# Patient Record
Sex: Female | Born: 1945
Health system: Southern US, Community
[De-identification: ages and names within clinical notes are randomized; demographics above are authoritative.]

## PROBLEM LIST (undated history)

## (undated) DIAGNOSIS — M797 Fibromyalgia: Secondary | ICD-10-CM

## (undated) DIAGNOSIS — G990 Autonomic neuropathy in diseases classified elsewhere: Secondary | ICD-10-CM

## (undated) DIAGNOSIS — F5104 Psychophysiologic insomnia: Secondary | ICD-10-CM

## (undated) DIAGNOSIS — K859 Acute pancreatitis without necrosis or infection, unspecified: Secondary | ICD-10-CM

## (undated) DIAGNOSIS — L511 Stevens-Johnson syndrome: Secondary | ICD-10-CM

## (undated) DIAGNOSIS — C4491 Basal cell carcinoma of skin, unspecified: Secondary | ICD-10-CM

## (undated) DIAGNOSIS — H35039 Hypertensive retinopathy, unspecified eye: Secondary | ICD-10-CM

## (undated) DIAGNOSIS — I951 Orthostatic hypotension: Secondary | ICD-10-CM

## (undated) DIAGNOSIS — M359 Systemic involvement of connective tissue, unspecified: Secondary | ICD-10-CM

## (undated) DIAGNOSIS — J850 Gangrene and necrosis of lung: Secondary | ICD-10-CM

## (undated) DIAGNOSIS — E785 Hyperlipidemia, unspecified: Secondary | ICD-10-CM

## (undated) DIAGNOSIS — E039 Hypothyroidism, unspecified: Secondary | ICD-10-CM

## (undated) DIAGNOSIS — K219 Gastro-esophageal reflux disease without esophagitis: Secondary | ICD-10-CM

## (undated) DIAGNOSIS — N393 Stress incontinence (female) (male): Secondary | ICD-10-CM

## (undated) DIAGNOSIS — G43909 Migraine, unspecified, not intractable, without status migrainosus: Secondary | ICD-10-CM

## (undated) HISTORY — PX: CATARACT EXTRACTION: SUR2

## (undated) HISTORY — PX: FRACTURE SURGERY: SHX138

## (undated) HISTORY — PX: YAG LASER APPLICATION: SHX6189

## (undated) HISTORY — DX: Stevens-Johnson syndrome: L51.1

## (undated) HISTORY — PX: EYE SURGERY: SHX253

## (undated) HISTORY — DX: Hypertensive retinopathy, unspecified eye: H35.039

## (undated) HISTORY — DX: Psychophysiologic insomnia: F51.04

## (undated) HISTORY — DX: Orthostatic hypotension: I95.1

## (undated) HISTORY — DX: Stress incontinence (female) (male): N39.3

---
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
## 1977-03-21 HISTORY — PX: APPENDECTOMY: SHX54

## 1977-03-21 HISTORY — PX: ABDOMINAL HYSTERECTOMY: SHX81

## 1986-03-21 HISTORY — PX: KNEE ARTHROSCOPY: SHX127

## 2002-03-21 HISTORY — PX: LAPAROSCOPIC CHOLECYSTECTOMY: SUR755

## 2008-03-21 DIAGNOSIS — J850 Gangrene and necrosis of lung: Secondary | ICD-10-CM

## 2008-03-21 HISTORY — PX: LUNG REMOVAL, PARTIAL: SHX233

## 2008-03-21 HISTORY — DX: Gangrene and necrosis of lung: J85.0

## 2008-03-21 HISTORY — PX: ELBOW FRACTURE SURGERY: SHX616

## 2012-03-21 HISTORY — PX: CATARACT EXTRACTION W/ INTRAOCULAR LENS  IMPLANT, BILATERAL: SHX1307

## 2014-12-20 HISTORY — PX: LOOP RECORDER IMPLANT: SHX5954

## 2015-03-22 DIAGNOSIS — G908 Other disorders of autonomic nervous system: Secondary | ICD-10-CM

## 2015-03-22 DIAGNOSIS — G9089 Other disorders of autonomic nervous system: Secondary | ICD-10-CM

## 2015-03-22 HISTORY — DX: Other disorders of autonomic nervous system: G90.8

## 2015-03-22 HISTORY — DX: Other disorders of autonomic nervous system: G90.89

## 2015-03-22 HISTORY — PX: ANKLE FRACTURE SURGERY: SHX122

## 2016-12-23 DIAGNOSIS — H538 Other visual disturbances: Secondary | ICD-10-CM | POA: Diagnosis not present

## 2016-12-23 DIAGNOSIS — J310 Chronic rhinitis: Secondary | ICD-10-CM | POA: Diagnosis not present

## 2016-12-23 DIAGNOSIS — I951 Orthostatic hypotension: Secondary | ICD-10-CM | POA: Diagnosis not present

## 2016-12-23 DIAGNOSIS — F4321 Adjustment disorder with depressed mood: Secondary | ICD-10-CM | POA: Diagnosis not present

## 2016-12-23 DIAGNOSIS — M797 Fibromyalgia: Secondary | ICD-10-CM | POA: Diagnosis not present

## 2016-12-23 DIAGNOSIS — G43909 Migraine, unspecified, not intractable, without status migrainosus: Secondary | ICD-10-CM | POA: Diagnosis not present

## 2016-12-23 DIAGNOSIS — E038 Other specified hypothyroidism: Secondary | ICD-10-CM | POA: Diagnosis not present

## 2016-12-23 DIAGNOSIS — G4709 Other insomnia: Secondary | ICD-10-CM | POA: Diagnosis not present

## 2016-12-23 DIAGNOSIS — E7849 Other hyperlipidemia: Secondary | ICD-10-CM | POA: Diagnosis not present

## 2017-01-16 DIAGNOSIS — M25571 Pain in right ankle and joints of right foot: Secondary | ICD-10-CM | POA: Diagnosis not present

## 2017-01-16 DIAGNOSIS — S93491A Sprain of other ligament of right ankle, initial encounter: Secondary | ICD-10-CM | POA: Diagnosis not present

## 2017-01-26 DIAGNOSIS — H26493 Other secondary cataract, bilateral: Secondary | ICD-10-CM | POA: Diagnosis not present

## 2017-01-26 DIAGNOSIS — Z961 Presence of intraocular lens: Secondary | ICD-10-CM | POA: Diagnosis not present

## 2017-01-26 DIAGNOSIS — H35033 Hypertensive retinopathy, bilateral: Secondary | ICD-10-CM | POA: Diagnosis not present

## 2017-01-26 DIAGNOSIS — H34831 Tributary (branch) retinal vein occlusion, right eye, with macular edema: Secondary | ICD-10-CM | POA: Diagnosis not present

## 2017-01-27 NOTE — Progress Notes (Signed)
[facility] Note  01/31/2017     CHIEF COMPLAINT Patient presents for Retina Evaluation   HISTORY OF PRESENT ILLNESS: Natalie Johnson is a 71 y.o. female who presents to the clinic today for:   HPI    Retina Evaluation    In both eyes.  Associated Symptoms Blind Spot, Floaters and Distortion.  Negative for Flashes, Pain, Trauma, Fever, Weight Loss, Scalp Tenderness, Redness, Shoulder/Hip pain, Fatigue, Jaw Claudication, Photophobia and Glare.  Context:  mid-range vision, near vision and watching TV.  Treatments tried include no treatments.  I, the attending physician,  performed the HPI with the patient and updated documentation appropriately.          Comments    Referral of Dr. Kathlen Mody for Eval. Of retina BRVO W/CME OD . Patient states her right eye started having floaters and blind spots about 3 months ago then she started having cloudy vision soon afterwards.Denies eye vits and eye gtts .       Last edited by [name], MD on 01/31/2017  9:40 AM. (History)    Pt reports that she just moved to [facility] in July, states that she has been treated at a retinal clinic in [place], pt states that she was getting injections, pt is unable to recall the medication that she was being injected with; Pt reports that she wishes to have injection today; Pt denies being diabetic; Pt reports only having cataract surgery;   Referring physician: Hortencia Pilar, MD [place], [place] [address]  HISTORICAL INFORMATION:   Selected notes from the MEDICAL RECORD NUMBER Referral from Dr. Read Drivers for concern of BRVO OD;  LEE- 11.8.18 (Dr. Read Drivers) Ocular Hx- pseudophakia OU (2015, in [place], Surgeon: unknown), Hx of injections for 'retinal bleeding' [last injection around June 2018], SJS, HTN ret OU;  PMH- SJS, thyroid disease, high chol., fibromyalgia;    CURRENT MEDICATIONS: No current outpatient medications on file. (Ophthalmic Drugs)   No  current facility-administered medications for this visit.  (Ophthalmic Drugs)   Current Outpatient Medications (Other)  Medication Sig  . amitriptyline (ELAVIL) 100 MG tablet   . clonazePAM (KLONOPIN) 2 MG tablet   . DULoxetine (CYMBALTA) 30 MG capsule   . fludrocortisone (FLORINEF) 0.1 MG tablet Take 0.1 mg daily by mouth.  . levothyroxine (SYNTHROID, LEVOTHROID) 100 MCG tablet   . lovastatin (MEVACOR) 40 MG tablet   . omeprazole (PRILOSEC) 40 MG capsule   . potassium chloride (MICRO-K) 10 MEQ CR capsule   . rizatriptan (MAXALT) 10 MG tablet    Current Facility-Administered Medications (Other)  Medication Route  . Bevacizumab (AVASTIN) SOLN 1.25 mg Intravitreal      REVIEW OF SYSTEMS: ROS    Positive for: Genitourinary, Musculoskeletal, Cardiovascular, Eyes, Respiratory, Allergic/Imm   Negative for: Constitutional, Gastrointestinal, Skin, HENT, Endocrine, Psychiatric, Heme/Lymph   Last edited by Zenovia Jordan, LPN on 34/74/2595  6:38 AM. (History)       ALLERGIES Allergies  Allergen Reactions  . Augmentin [Amoxicillin-Pot Clavulanate] Anaphylaxis  . Trihexyphenidyl Hcl Anaphylaxis  . Vancomycin Anaphylaxis  . Amoxicillin Hives  . Penicillins Hives  . Piperacillin Hives  . Potassium-Containing Compounds   . Sodium Acetylsalicylate [Aspirin] Hives  . Soma [Carisoprodol] Hives  . Linezolid Rash    PAST MEDICAL HISTORY Past Medical History:  Diagnosis Date  . Ankle fracture   . Neuropathy   . Stevens-Johnson disease [facility])    Past Surgical History:  Procedure Laterality Date  .  ABDOMINAL HYSTERECTOMY    . CATARACT EXTRACTION  2014   OU  . ELBOW FRACTURE SURGERY    . GALLBLADDER SURGERY    . KNEE ARTHROSCOPY    . LUNG LOBECTOMY    . SHOULDER SURGERY    . TONSILLECTOMY      FAMILY HISTORY Family History  Problem Relation Age of Onset  . Macular degeneration Mother   . Glaucoma Father     SOCIAL HISTORY Social History   Tobacco Use  . Smoking  status: Never Smoker  . Smokeless tobacco: Never Used  Substance Use Topics  . Alcohol use: No    Frequency: Never  . Drug use: No         OPHTHALMIC EXAM:  Base Eye Exam    Visual Acuity (Snellen - Linear)      Right Left   Dist [place] 20/100 +2 20/30 -2   Dist ph [place] 20/50 -2 20/25 -2       Tonometry (Tonopen, 9:17 AM)      Right Left   Pressure 14 12       Pupils      Dark Light Shape React APD   Right 6 5 Round 1 None   Left 6 5 Round 1 None       Visual Fields (Counting fingers)      Left Right    Full Full       Extraocular Movement      Right Left    Full, Ortho Full, Ortho       Neuro/Psych    Oriented x3:  Yes   Mood/Affect:  Normal       Dilation    Both eyes:  1.0% Mydriacyl, 2.5% Phenylephrine @ 9:17 AM        Slit Lamp and Fundus Exam    External Exam      Right Left   External Normal Normal       Slit Lamp Exam      Right Left   Lids/Lashes Dermatochalasis - upper lid Dermatochalasis - upper lid   Conjunctiva/Sclera White and quiet White and quiet   Cornea Arcus, 2+ Punctate epithelial erosions Arcus, 2+ Punctate epithelial erosions   Anterior Chamber Deep and quiet Deep and quiet   Iris Round and dilated to 36m; no NVI Round and dilated to 868m  Lens Posterior chamber intraocular lens, Nasal 1+ Posterior capsular opacification Posterior chamber intraocular lens, Nasal 1+ diffuse Posterior capsular opacification   Vitreous Vitreous syneresis, Posterior vitreous detachment Vitreous syneresis       Fundus Exam      Right Left   Disc Normal, vascular loops Normal   C/D Ratio 0.4 0.45   Macula + intraretinal hemorrhages and MAs, + edema Flat, few nasal MAs   Vessels Tortuous Mild tortuosity    Periphery Attached, scattered dot hemorrhages, punctate pigmented lesion at 1200 Attached        Refraction    Manifest Refraction (Retinoscopy)      Sphere Cylinder Axis Dist VA   Right -0.25 +1.00 020 20/40-2   Left -0.50   20/25-2           IMAGING AND PROCEDURES  Imaging and Procedures for 01/31/17  OCT, Retina - OU - Both Eyes     Right Eye Quality was good. Central Foveal Thickness: 509. Progression has no prior data. Findings include abnormal foveal contour, subretinal fluid, intraretinal fluid.   Left Eye Quality was good. Central Foveal Thickness: 262.  Progression has no prior data. Findings include normal foveal contour, no IRF, no SRF, vitreomacular adhesion .   Notes Images taken, stored on drive  Diagnosis / Impression:  OD: BRVO with CME OS: VMA, No IRF, No SRF  Clinical management:  See below  Abbreviations: NFP - Normal foveal profile. CME - cystoid macular edema. PED - pigment epithelial detachment. IRF - intraretinal fluid. SRF - subretinal fluid. EZ - ellipsoid zone. ERM - epiretinal membrane. ORA - outer retinal atrophy. ORT - outer retinal tubulation. SRHM - subretinal hyper-reflective material         Intravitreal Injection, Pharmacologic Agent - OD - Right Eye     Time Out 01/31/2017. 10:39 AM. Confirmed correct patient, procedure, site, and patient consented.   Anesthesia Topical anesthesia was used. Anesthetic medications included Lidocaine 2%, Tetracaine 0.5%.   Procedure Preparation included 5% betadine to ocular surface, eyelid speculum. A 30 gauge needle was used.   Injection: 1.25 mg Bevacizumab 1.53m/0.05ml   NDC: 585885-027-74   Lot: [id number]'@11'     Expiration Date: 03/01/2017   Route: Intravitreal   Site: Right Eye   Waste: 0 mg  Post-op Post injection exam found visual acuity of at least counting fingers. The patient tolerated the procedure well. There were no complications. The patient received written and verbal post procedure care education.                 ASSESSMENT/PLAN:    ICD-10-CM   1. Branch retinal vein occlusion of right eye with macular edema H34.8310 Intravitreal Injection, Pharmacologic Agent - OD - Right Eye    Bevacizumab (AVASTIN)  SOLN 1.25 mg  2. Retinal edema H35.81 OCT, Retina - OU - Both Eyes  3. Vitreomacular adhesion of left eye H43.822   4. Hypertensive retinopathy of both eyes H35.033   5. Pseudophakia of both eyes Z96.1   6. Stevens-Johnson syndrome (HCC) L51.1     1,2. BRVO with ME OD-  - The natural history of retinal vein occlusion and macular edema and treatment options including observation, laser photocoagulation, and intravitreal antiVEGF injection with Avastin and Lucentis and Eylea and intravitreal injection of steroids with triamcinolone and Ozurdex and the complications of these procedures including loss of vision, infection, cataract, glaucoma, and retinal detachment were discussed with patient. - Specifically discussed findings [place] regarding patient stabilization with anti-VEGF agents and increased potential for visual improvements.  Also discussed need for frequent follow up and potentially multiple injections given the chronic nature of the disease process [place] [place] was receiving unknown injections, treat and extend, OD - last injection OD in June 2018 - subjective worsening of vision OD since July - BCVA 20/50 OD - OCT shows +IRF and SRF OD - recommend IVA OD #1 today, 11.13.18 - RBA of procedure discussed, questions answered - informed consent obtained and signed - see procedure note - F/U 4 weeks -- DFE/OCT/possible injection  3. VMA OS - mild without traction - monitor  4. Hypertensive Retinopathy OU-  - discussed importance of tight BP control - monitor  5. Pseudophakia OU-  - s/p CE/IOL OU - beautiful surgery, doing well - monitor  6. SJS w/ mild DES OU - recommend artificial tears and lubricating ointment as needed   Ophthalmic Meds Ordered this visit:  Meds ordered this encounter  Medications  . Bevacizumab (AVASTIN) SOLN 1.25 mg       Return in about 4 weeks (around 02/28/2017) for F/U BRVO  with ME OD.  There are no  Patient Instructions on file for this visit.   Explained the diagnoses, plan, and follow up with the patient and they expressed understanding.  Patient expressed understanding of the importance of proper follow up care.   Gardiner Sleeper, M.D., Ph.D. Diseases & Surgery of the Retina and Vitreous [facility] 01/31/17     Abbreviations: M myopia (nearsighted); A astigmatism; H hyperopia (farsighted); P presbyopia; Mrx spectacle prescription;  CTL contact lenses; OD right eye; OS left eye; OU both eyes  XT exotropia; ET esotropia; PEK punctate epithelial keratitis; PEE punctate epithelial erosions; DES dry eye syndrome; MGD meibomian gland dysfunction; ATs artificial tears; PFAT's preservative free artificial tears; [place] nuclear sclerotic cataract; PSC posterior subcapsular cataract; ERM epi-retinal membrane; PVD posterior vitreous detachment; RD retinal detachment; DM diabetes mellitus; DR diabetic retinopathy; NPDR non-proliferative diabetic retinopathy; PDR proliferative diabetic retinopathy; CSME clinically significant macular edema; DME diabetic macular edema; dbh dot blot hemorrhages; CWS cotton wool spot; POAG primary open angle glaucoma; C/D cup-to-disc ratio; HVF humphrey visual field; GVF goldmann visual field; OCT optical coherence tomography; IOP intraocular pressure; BRVO Branch retinal vein occlusion; CRVO central retinal vein occlusion; CRAO central retinal artery occlusion; BRAO branch retinal artery occlusion; RT retinal tear; SB scleral buckle; PPV pars plana vitrectomy; VH Vitreous hemorrhage; PRP panretinal laser photocoagulation; IVK intravitreal kenalog; VMT vitreomacular traction; MH Macular hole;  NVD neovascularization of the disc; NVE neovascularization elsewhere; AREDS age related eye disease study; ARMD age related macular degeneration; POAG primary open angle glaucoma; EBMD epithelial/anterior basement membrane dystrophy; ACIOL anterior chamber  intraocular lens; IOL intraocular lens; PCIOL posterior chamber intraocular lens; Phaco/IOL phacoemulsification with intraocular lens placement; [place] photorefractive keratectomy; LASIK laser assisted in situ keratomileusis; HTN hypertension; DM diabetes mellitus; COPD chronic obstructive pulmonary disease

## 2017-01-28 DIAGNOSIS — S93491D Sprain of other ligament of right ankle, subsequent encounter: Secondary | ICD-10-CM | POA: Diagnosis not present

## 2017-01-31 ENCOUNTER — Encounter (INDEPENDENT_AMBULATORY_CARE_PROVIDER_SITE_OTHER): Payer: Self-pay | Admitting: Ophthalmology

## 2017-01-31 ENCOUNTER — Ambulatory Visit (INDEPENDENT_AMBULATORY_CARE_PROVIDER_SITE_OTHER): Payer: Medicare HMO | Admitting: Ophthalmology

## 2017-01-31 DIAGNOSIS — Z961 Presence of intraocular lens: Secondary | ICD-10-CM | POA: Diagnosis not present

## 2017-01-31 DIAGNOSIS — H34831 Tributary (branch) retinal vein occlusion, right eye, with macular edema: Secondary | ICD-10-CM

## 2017-01-31 DIAGNOSIS — L511 Stevens-Johnson syndrome: Secondary | ICD-10-CM | POA: Diagnosis not present

## 2017-01-31 DIAGNOSIS — H3581 Retinal edema: Secondary | ICD-10-CM

## 2017-01-31 DIAGNOSIS — H35033 Hypertensive retinopathy, bilateral: Secondary | ICD-10-CM

## 2017-01-31 DIAGNOSIS — H43822 Vitreomacular adhesion, left eye: Secondary | ICD-10-CM

## 2017-01-31 MED ORDER — BEVACIZUMAB CHEMO INJECTION 1.25MG/0.05ML SYRINGE FOR KALEIDOSCOPE
1.2500 mg | INTRAVITREAL | Status: AC
  Administered 2017-01-31: 1.25 mg via INTRAVITREAL

## 2017-02-20 DIAGNOSIS — S93491D Sprain of other ligament of right ankle, subsequent encounter: Secondary | ICD-10-CM | POA: Diagnosis not present

## 2017-02-23 NOTE — Progress Notes (Deleted)
[facility] Note  02/24/2017     CHIEF COMPLAINT Patient presents for No chief complaint on file.   HISTORY OF PRESENT ILLNESS: Natalie Johnson is a 71 y.o. female who presents to the clinic today for:   Pt reports that she just moved to [facility] in July, states that she has been treated at a retinal clinic in [place], pt states that she was getting injections, pt is unable to recall the medication that she was being injected with; Pt reports that she wishes to have injection today; Pt denies being diabetic; Pt reports only having cataract surgery;   Referring physician: [name], MD [address] [place], [place] [address]  HISTORICAL INFORMATION:   Selected notes from the MEDICAL RECORD NUMBER Referral from Dr. Read Drivers for concern of BRVO OD;  LEE- 11.8.18 (Dr. Read Drivers) Ocular Hx- pseudophakia OU (2015, in [place], Surgeon: unknown), Hx of injections for 'retinal bleeding' [last injection around June 2018], SJS, HTN ret OU;  PMH- SJS, thyroid disease, high chol., fibromyalgia;    CURRENT MEDICATIONS: No current outpatient medications on file. (Ophthalmic Drugs)   No current facility-administered medications for this visit.  (Ophthalmic Drugs)   Current Outpatient Medications (Other)  Medication Sig   amitriptyline (ELAVIL) 100 MG tablet    clonazePAM (KLONOPIN) 2 MG tablet    DULoxetine (CYMBALTA) 30 MG capsule    fludrocortisone (FLORINEF) 0.1 MG tablet Take 0.1 mg daily by mouth.   levothyroxine (SYNTHROID, LEVOTHROID) 100 MCG tablet    lovastatin (MEVACOR) 40 MG tablet    omeprazole (PRILOSEC) 40 MG capsule    potassium chloride (MICRO-K) 10 MEQ CR capsule    rizatriptan (MAXALT) 10 MG tablet    Current Facility-Administered Medications (Other)  Medication Route   Bevacizumab (AVASTIN) SOLN 1.25 mg Intravitreal      REVIEW OF SYSTEMS:    ALLERGIES Allergies  Allergen Reactions   Augmentin [Amoxicillin-Pot  Clavulanate] Anaphylaxis   Trihexyphenidyl Hcl Anaphylaxis   Vancomycin Anaphylaxis   Amoxicillin Hives   Penicillins Hives   Piperacillin Hives   Potassium-Containing Compounds    Sodium Acetylsalicylate [Aspirin] Hives   Soma [Carisoprodol] Hives   Linezolid Rash    PAST MEDICAL HISTORY Past Medical History:  Diagnosis Date   Ankle fracture    Neuropathy    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HCC)    Past Surgical History:  Procedure Laterality Date   ABDOMINAL HYSTERECTOMY     CATARACT EXTRACTION  2014   OU   ELBOW FRACTURE SURGERY     GALLBLADDER SURGERY     KNEE ARTHROSCOPY     LUNG LOBECTOMY     SHOULDER SURGERY     TONSILLECTOMY      FAMILY HISTORY Family History  Problem Relation Age of Onset   Macular degeneration Mother    Glaucoma Father     SOCIAL HISTORY Social History   Tobacco Use   Smoking status: Never Smoker   Smokeless tobacco: Never Used  Substance Use Topics   Alcohol use: No    Frequency: Never   Drug use: No         OPHTHALMIC EXAM:   Not recorded      IMAGING AND PROCEDURES  Imaging and Procedures for 02/23/17           ASSESSMENT/PLAN:    ICD-10-CM   1. Branch retinal vein occlusion of right eye with macular edema H34.8310 OCT, Retina - OU - Both Eyes  2. Retinal edema H35.81  3. Vitreomacular adhesion of left eye H43.822   4. Hypertensive retinopathy of both eyes H35.033   5. Pseudophakia of both eyes Z96.1   6. Stevens-Johnson syndrome (HCC) L51.1     1,2. BRVO with ME OD-  - The natural history of retinal vein occlusion and macular edema and treatment options including observation, laser photocoagulation, and intravitreal antiVEGF injection with Avastin and Lucentis and Eylea and intravitreal injection of steroids with triamcinolone and Ozurdex and the complications of these procedures including loss of vision, infection, cataract, glaucoma, and retinal detachment were discussed with  patient. - Specifically discussed findings from [place] / [place] study regarding patient stabilization with anti-VEGF agents and increased potential for visual improvements.  Also discussed need for frequent follow up and potentially multiple injections given the chronic nature of the disease process - moved here from [place] in July, was receiving unknown injections, treat and extend, OD - last injection OD in June 2018 - subjective worsening of vision OD since July - BCVA 20/50 OD - OCT shows +IRF and SRF OD - IVA OD #1 (11.13.18) - recommend IVA OD #2 today, 12.07.18 - RBA of procedure discussed, questions answered - informed consent obtained and signed - see procedure note - F/U 4 weeks -- DFE/OCT/possible injection  3. VMA OS - mild without traction - monitor  4. Hypertensive Retinopathy OU-  - discussed importance of tight BP control - monitor  5. Pseudophakia OU-  - s/p CE/IOL OU - beautiful surgery, doing well - monitor  6. SJS w/ mild DES OU - recommend artificial tears and lubricating ointment as needed   Ophthalmic Meds Ordered this visit:  No orders of the defined types were placed in this encounter.      No Follow-up on file.  There are no Patient Instructions on file for this visit.   Explained the diagnoses, plan, and follow up with the patient and they expressed understanding.  Patient expressed understanding of the importance of proper follow up care.   This document serves as a record of services personally performed by Gardiner Sleeper, MD, PhD. It was created on their behalf by Catha Brow, [place], a certified ophthalmic assistant. The creation of this record is the provider's dictation and/or activities during the visit.  Electronically signed by: Catha Brow, COA  02/23/17 4:10 PM    Gardiner Sleeper, M.D., Ph.D. Diseases & Surgery of the Retina and Vitreous [facility] 02/23/17     Abbreviations: M myopia  (nearsighted); A astigmatism; H hyperopia (farsighted); P presbyopia; Mrx spectacle prescription;  CTL contact lenses; OD right eye; OS left eye; OU both eyes  XT exotropia; ET esotropia; PEK punctate epithelial keratitis; PEE punctate epithelial erosions; DES dry eye syndrome; MGD meibomian gland dysfunction; ATs artificial tears; PFAT's preservative free artificial tears; [place] nuclear sclerotic cataract; PSC posterior subcapsular cataract; ERM epi-retinal membrane; PVD posterior vitreous detachment; RD retinal detachment; DM diabetes mellitus; DR diabetic retinopathy; NPDR non-proliferative diabetic retinopathy; PDR proliferative diabetic retinopathy; CSME clinically significant macular edema; DME diabetic macular edema; dbh dot blot hemorrhages; CWS cotton wool spot; POAG primary open angle glaucoma; C/D cup-to-disc ratio; HVF humphrey visual field; GVF goldmann visual field; OCT optical coherence tomography; IOP intraocular pressure; BRVO Branch retinal vein occlusion; CRVO central retinal vein occlusion; CRAO central retinal artery occlusion; BRAO branch retinal artery occlusion; RT retinal tear; SB scleral buckle; PPV pars plana vitrectomy; VH Vitreous hemorrhage; PRP panretinal laser photocoagulation; IVK intravitreal kenalog; VMT vitreomacular traction; MH Macular hole;  NVD neovascularization of the disc; NVE neovascularization elsewhere; AREDS age related eye disease study; ARMD age related macular degeneration; POAG primary open angle glaucoma; EBMD epithelial/anterior basement membrane dystrophy; ACIOL anterior chamber intraocular lens; IOL intraocular lens; PCIOL posterior chamber intraocular lens; Phaco/IOL phacoemulsification with intraocular lens placement; [place] photorefractive keratectomy; LASIK laser assisted in situ keratomileusis; HTN hypertension; DM diabetes mellitus; COPD chronic obstructive pulmonary disease

## 2017-02-24 ENCOUNTER — Encounter (INDEPENDENT_AMBULATORY_CARE_PROVIDER_SITE_OTHER): Payer: Medicare HMO | Admitting: Ophthalmology

## 2017-02-27 ENCOUNTER — Encounter (INDEPENDENT_AMBULATORY_CARE_PROVIDER_SITE_OTHER): Payer: Medicare HMO | Admitting: Ophthalmology

## 2017-03-01 ENCOUNTER — Encounter (INDEPENDENT_AMBULATORY_CARE_PROVIDER_SITE_OTHER): Payer: Self-pay | Admitting: Ophthalmology

## 2017-03-01 ENCOUNTER — Ambulatory Visit (INDEPENDENT_AMBULATORY_CARE_PROVIDER_SITE_OTHER): Payer: Medicare HMO | Admitting: Ophthalmology

## 2017-03-01 DIAGNOSIS — H34831 Tributary (branch) retinal vein occlusion, right eye, with macular edema: Secondary | ICD-10-CM

## 2017-03-01 DIAGNOSIS — H43822 Vitreomacular adhesion, left eye: Secondary | ICD-10-CM

## 2017-03-01 DIAGNOSIS — Z961 Presence of intraocular lens: Secondary | ICD-10-CM

## 2017-03-01 DIAGNOSIS — H35033 Hypertensive retinopathy, bilateral: Secondary | ICD-10-CM

## 2017-03-01 DIAGNOSIS — L511 Stevens-Johnson syndrome: Secondary | ICD-10-CM

## 2017-03-01 DIAGNOSIS — H3581 Retinal edema: Secondary | ICD-10-CM

## 2017-03-01 MED ORDER — BEVACIZUMAB CHEMO INJECTION 1.25MG/0.05ML SYRINGE FOR KALEIDOSCOPE
1.2500 mg | INTRAVITREAL | Status: AC
  Administered 2017-03-01: 1.25 mg via INTRAVITREAL

## 2017-03-01 NOTE — Progress Notes (Signed)
[facility] Note  03/01/2017     CHIEF COMPLAINT Patient presents for Retina Follow Up   HISTORY OF PRESENT ILLNESS: Natalie Johnson is a 71 y.o. female who presents to the clinic today for:   HPI    Retina Follow Up    In right eye.  This started 3 months ago.  Severity is mild.  Since onset it is gradually improving.  I, the attending physician,  performed the HPI with the patient and updated documentation appropriately.          Comments    F/U BRVO w/ME OD. Patient states she has a very small black spot right eye and right eye also  has a film like covering that moves back and forth. Denies pain, flashes and glare. Pt VA has improved . Pt uses Dry gtts PRN . Denies Vits        Last edited by [name], MD on 03/01/2017  1:40 PM. (History)    Pt reports that she just moved to [facility] in July, states that she has been treated at a retinal clinic in [place], pt states that she was getting injections, pt is unable to recall the medication that she was being injected with; Pt reports that she wishes to have injection today; Pt denies being diabetic; Pt reports only having cataract surgery;   Referring physician: [name], MD [address] [place], [place] [address]  HISTORICAL INFORMATION:   Selected notes from the MEDICAL RECORD NUMBER Referral from Dr. Read Drivers for concern of BRVO OD;  LEE- 11.8.18 (Dr. Read Drivers) Ocular Hx- pseudophakia OU (2015, in [place], Surgeon: unknown), Hx of injections for 'retinal bleeding' [last injection around June 2018], SJS, HTN ret OU;  PMH- SJS, thyroid disease, high chol., fibromyalgia;    CURRENT MEDICATIONS: No current outpatient medications on file. (Ophthalmic Drugs)   No current facility-administered medications for this visit.  (Ophthalmic Drugs)   Current Outpatient Medications (Other)  Medication Sig  . amitriptyline (ELAVIL) 100 MG tablet   . clonazePAM (KLONOPIN) 2 MG tablet   . DULoxetine  (CYMBALTA) 30 MG capsule   . fludrocortisone (FLORINEF) 0.1 MG tablet Take 0.1 mg daily by mouth.  . levothyroxine (SYNTHROID, LEVOTHROID) 100 MCG tablet   . lovastatin (MEVACOR) 40 MG tablet   . omeprazole (PRILOSEC) 40 MG capsule   . potassium chloride (MICRO-K) 10 MEQ CR capsule   . rizatriptan (MAXALT) 10 MG tablet    Current Facility-Administered Medications (Other)  Medication Route  . Bevacizumab (AVASTIN) SOLN 1.25 mg Intravitreal  . Bevacizumab (AVASTIN) SOLN 1.25 mg Intravitreal      REVIEW OF SYSTEMS: ROS    Positive for: Eyes   Negative for: Constitutional, Gastrointestinal, Neurological, Skin, Genitourinary, Musculoskeletal, HENT, Endocrine, Cardiovascular, Respiratory, Psychiatric, Allergic/Imm, Heme/Lymph   Last edited by Zenovia Jordan, LPN on 76/28/3151  7:61 PM. (History)       ALLERGIES Allergies  Allergen Reactions  . Augmentin [Amoxicillin-Pot Clavulanate] Anaphylaxis  . Trihexyphenidyl Hcl Anaphylaxis  . Vancomycin Anaphylaxis  . Amoxicillin Hives  . Penicillins Hives  . Piperacillin Hives  . Potassium-Containing Compounds   . Sodium Acetylsalicylate [Aspirin] Hives  . Soma [Carisoprodol] Hives  . Linezolid Rash    PAST MEDICAL HISTORY Past Medical History:  Diagnosis Date  . Ankle fracture   . Neuropathy   . Stevens-Johnson disease [facility] [place] [facility])    Past Surgical History:  Procedure Laterality Date  . ABDOMINAL HYSTERECTOMY    . CATARACT EXTRACTION  2014  OU  . ELBOW FRACTURE SURGERY    . GALLBLADDER SURGERY    . KNEE ARTHROSCOPY    . LUNG LOBECTOMY    . SHOULDER SURGERY    . TONSILLECTOMY      FAMILY HISTORY Family History  Problem Relation Age of Onset  . Macular degeneration Mother   . Glaucoma Father     SOCIAL HISTORY Social History   Tobacco Use  . Smoking status: Never Smoker  . Smokeless tobacco: Never Used  Substance Use Topics  . Alcohol use: No    Frequency: Never  . Drug use: No         OPHTHALMIC  EXAM:  Base Eye Exam    Visual Acuity (Snellen - Linear)      Right Left   Dist [place] 20/40 -2 20/25 -1   Dist ph  [place] 20/30 -1 20/20 -1       Tonometry (Tonopen, 1:27 PM)      Right Left   Pressure 14 13       Pupils      Dark Light Shape React APD   Right 5 4 Round 1 None   Left 6 5 Round 1 None       Visual Fields (Counting fingers)      Left Right    Full Full       Extraocular Movement      Right Left    Full, Ortho Full, Ortho       Neuro/Psych    Oriented x3:  Yes   Mood/Affect:  Normal       Dilation    Both eyes:  1.0% Mydriacyl, 2.5% Phenylephrine @ 1:27 PM        Slit Lamp and Fundus Exam    External Exam      Right Left   External Normal Normal       Slit Lamp Exam      Right Left   Lids/Lashes Dermatochalasis - upper lid Dermatochalasis - upper lid   Conjunctiva/Sclera White and quiet White and quiet   Cornea Arcus, 2+ Punctate epithelial erosions Arcus, 2+ Punctate epithelial erosions   Anterior Chamber Deep and quiet Deep and quiet   Iris Round and dilated to 33m; no NVI Round and dilated to 828m  Lens Posterior chamber intraocular lens, Nasal 1+ Posterior capsular opacification Posterior chamber intraocular lens, Nasal 1+ diffuse Posterior capsular opacification   Vitreous Vitreous syneresis, Posterior vitreous detachment Vitreous syneresis       Fundus Exam      Right Left   Disc Normal, vascular loops Normal   C/D Ratio 0.4 0.45   Macula Improved intraretinal hemorrhages and MAs, improved edema Flat, few nasal MAs   Vessels Tortuous Mild tortuosity    Periphery Attached, scattered dot hemorrhages, punctate pigmented lesion at 1200 Attached          IMAGING AND PROCEDURES  Imaging and Procedures for 03/01/17  OCT, Retina - OU - Both Eyes     Right Eye Quality was good. Central Foveal Thickness: 250. Progression has improved. Findings include intraretinal fluid, normal foveal contour, no SRF (Improved SRF and IRF; residual IRF  persists ).   Left Eye Quality was good. Central Foveal Thickness: 261. Progression has been stable. Findings include normal foveal contour, no IRF, no SRF, vitreomacular adhesion .   Notes Images taken, stored on drive  Diagnosis / Impression:  OD: BRVO with interval improvement of CME and SRF OS: VMA, No IRF, No SRF  Clinical management:  See below  Abbreviations: NFP - Normal foveal profile. CME - cystoid macular edema. PED - pigment epithelial detachment. IRF - intraretinal fluid. SRF - subretinal fluid. EZ - ellipsoid zone. ERM - epiretinal membrane. ORA - outer retinal atrophy. ORT - outer retinal tubulation. SRHM - subretinal hyper-reflective material         Intravitreal Injection, Pharmacologic Agent - OD - Right Eye     Time Out 03/01/2017. 2:12 PM. Confirmed correct patient, procedure, site, and patient consented.   Anesthesia Topical anesthesia was used. Anesthetic medications included Lidocaine 2%, Tetracaine 0.5%.   Procedure Preparation included 5% betadine to ocular surface, eyelid speculum. A supplied needle was used.   Injection: 1.25 mg Bevacizumab 1.37m/0.05ml   NDC: 541660-630-16   Lot: [id number]'@44'     Expiration Date: 03/29/2017   Route: Intravitreal   Site: Right Eye   Waste: 0 mg  Post-op Post injection exam found visual acuity of at least counting fingers. The patient tolerated the procedure well. There were no complications. The patient received written and verbal post procedure care education.                 ASSESSMENT/PLAN:    ICD-10-CM   1. Branch retinal vein occlusion of right eye with macular edema H34.8310 OCT, Retina - OU - Both Eyes    Intravitreal Injection, Pharmacologic Agent - OD - Right Eye    Bevacizumab (AVASTIN) SOLN 1.25 mg  2. Retinal edema H35.81   3. Vitreomacular adhesion of left eye H43.822   4. Hypertensive retinopathy of both eyes H35.033   5. Pseudophakia of both eyes Z96.1   6. Stevens-Johnson syndrome  (HCC) L51.1     1,2. BRVO with ME OD-  - [place], was receiving unknown injections, treat and extend, OD - last TX injection OD in June 2018 - subjective worsening of vision OD since July - s/p IVA OD #1 (11.13.18) - today, OCT shows resolved SRF, vastly improved IRF OD - BCVA 20/30-1 OD from 20/50 last month - recommend IVA OD #2 today, 12.12.18 - RBA of procedure discussed, questions answered - informed consent obtained and signed - see procedure note - F/U 5 weeks -- DFE/OCT/possible injection  3. VMA OS - mild without traction - stable - monitor  4. Hypertensive Retinopathy OU-  - discussed importance of tight BP control - monitor  5. Pseudophakia OU-  - s/p CE/IOL OU - beautiful surgery, doing well - monitor  6. SJS w/ mild DES OU - recommend artificial tears and lubricating ointment as needed   Ophthalmic Meds Ordered this visit:  Meds ordered this encounter  Medications  . Bevacizumab (AVASTIN) SOLN 1.25 mg       Return in about 5 weeks (around 04/05/2017) for F/U BRVO with ME OD.  There are no Patient Instructions on file for this visit.   Explained the diagnoses, plan, and follow up with the patient and they expressed understanding.  Patient expressed understanding of the importance of proper follow up care.   This document serves as a record of services personally performed by B1/29/2019 MD, PhD. It was created on their behalf by MGardiner Sleeper CAnsonia a certified ophthalmic assistant. The creation of this record is the provider's dictation and/or activities during the visit.  Electronically signed by: M[address] COA  03/01/17 2:13 PM    B25/12/18 M.D., Ph.D. Diseases & Surgery of the Retina and Vitreous [facility] 03/01/17   I have reviewed the above documentation for accuracy and completeness, and I agree with the above. Gardiner Sleeper, M.D., Ph.D. 03/01/17 2:13  PM    Abbreviations: M myopia (nearsighted); A astigmatism; H hyperopia (farsighted); P presbyopia; Mrx spectacle prescription;  CTL contact lenses; OD right eye; OS left eye; OU both eyes  XT exotropia; ET esotropia; PEK punctate epithelial keratitis; PEE punctate epithelial erosions; DES dry eye syndrome; MGD meibomian gland dysfunction; ATs artificial tears; PFAT's preservative free artificial tears; [place] nuclear sclerotic cataract; PSC posterior subcapsular cataract; ERM epi-retinal membrane; PVD posterior vitreous detachment; RD retinal detachment; DM diabetes mellitus; DR diabetic retinopathy; NPDR non-proliferative diabetic retinopathy; PDR proliferative diabetic retinopathy; CSME clinically significant macular edema; DME diabetic macular edema; dbh dot blot hemorrhages; CWS cotton wool spot; POAG primary open angle glaucoma; C/D cup-to-disc ratio; HVF humphrey visual field; GVF goldmann visual field; OCT optical coherence tomography; IOP intraocular pressure; BRVO Branch retinal vein occlusion; CRVO central retinal vein occlusion; CRAO central retinal artery occlusion; BRAO branch retinal artery occlusion; RT retinal tear; SB scleral buckle; PPV pars plana vitrectomy; VH Vitreous hemorrhage; PRP panretinal laser photocoagulation; IVK intravitreal kenalog; VMT vitreomacular traction; MH Macular hole;  NVD neovascularization of the disc; NVE neovascularization elsewhere; AREDS age related eye disease study; ARMD age related macular degeneration; POAG primary open angle glaucoma; EBMD epithelial/anterior basement membrane dystrophy; ACIOL anterior chamber intraocular lens; IOL intraocular lens; PCIOL posterior chamber intraocular lens; Phaco/IOL phacoemulsification with intraocular lens placement; [place] photorefractive keratectomy; LASIK laser assisted in situ keratomileusis; HTN hypertension; DM diabetes mellitus; COPD chronic obstructive pulmonary disease

## 2017-03-16 DIAGNOSIS — E7849 Other hyperlipidemia: Secondary | ICD-10-CM | POA: Diagnosis not present

## 2017-03-16 DIAGNOSIS — N390 Urinary tract infection, site not specified: Secondary | ICD-10-CM | POA: Diagnosis not present

## 2017-03-16 DIAGNOSIS — E038 Other specified hypothyroidism: Secondary | ICD-10-CM | POA: Diagnosis not present

## 2017-03-16 DIAGNOSIS — Z0000 Encounter for general adult medical examination without abnormal findings: Secondary | ICD-10-CM | POA: Diagnosis not present

## 2017-03-24 DIAGNOSIS — E038 Other specified hypothyroidism: Secondary | ICD-10-CM | POA: Diagnosis not present

## 2017-03-24 DIAGNOSIS — I951 Orthostatic hypotension: Secondary | ICD-10-CM | POA: Diagnosis not present

## 2017-03-24 DIAGNOSIS — E7849 Other hyperlipidemia: Secondary | ICD-10-CM | POA: Diagnosis not present

## 2017-03-24 DIAGNOSIS — F4321 Adjustment disorder with depressed mood: Secondary | ICD-10-CM | POA: Diagnosis not present

## 2017-03-24 DIAGNOSIS — H348312 Tributary (branch) retinal vein occlusion, right eye, stable: Secondary | ICD-10-CM | POA: Diagnosis not present

## 2017-03-24 DIAGNOSIS — Z0000 Encounter for general adult medical examination without abnormal findings: Secondary | ICD-10-CM | POA: Diagnosis not present

## 2017-03-24 DIAGNOSIS — M65322 Trigger finger, left index finger: Secondary | ICD-10-CM | POA: Diagnosis not present

## 2017-03-24 DIAGNOSIS — M797 Fibromyalgia: Secondary | ICD-10-CM | POA: Diagnosis not present

## 2017-03-24 DIAGNOSIS — Z6825 Body mass index (BMI) 25.0-25.9, adult: Secondary | ICD-10-CM | POA: Diagnosis not present

## 2017-04-03 NOTE — Progress Notes (Signed)
[facility] Note  04/04/2017     CHIEF COMPLAINT Patient presents for Retina Follow Up   HISTORY OF PRESENT ILLNESS: Natalie Johnson is a 72 y.o. female who presents to the clinic today for:   HPI    Retina Follow Up    Patient presents with  CRVO/BRVO.  In right eye.  This started 6 months ago.  Severity is mild.  Since onset it is stable.  I, the attending physician,  performed the HPI with the patient and updated documentation appropriately.          Comments    F/U BRVO w/ME OD; Pt states she continues to have film like covering over her right eye with floaters that moves back and forth. Pt states " it is difficult to read street signs and she did not relay it to the doctor on her last visit because she thought it would go away". Denies glare, flashes and ocular pain. Pt uses dry eye gtts (when she remembers). Denies vits        Last edited by [name], MD on 04/04/2017  2:23 PM. (History)    Pt reports she feels OU VA is stable; Pt reports she has noticed that street signs are more blurred; Pt states she tolerated last injection well, pt is prepared to proceed with injection today;   Referring physician: [name], MD [place], [place] [address]  HISTORICAL INFORMATION:   Selected notes from the [facility] Referral from Dr. Read Drivers for concern of BRVO OD;  LEE- 11.8.18 (Dr. Read Drivers) Ocular Hx- pseudophakia OU (2015, in [place], Surgeon: unknown), Hx of injections for 'retinal bleeding' [last injection around June 2018], SJS, HTN ret OU;  PMH- SJS, thyroid disease, high chol., fibromyalgia;    CURRENT MEDICATIONS: No current outpatient medications on file. (Ophthalmic Drugs)   No current facility-administered medications for this visit.  (Ophthalmic Drugs)   Current Outpatient Medications (Other)  Medication Sig  . amitriptyline (ELAVIL) 100 MG tablet   . clonazePAM (KLONOPIN) 2 MG tablet   . DULoxetine  (CYMBALTA) 30 MG capsule   . fludrocortisone (FLORINEF) 0.1 MG tablet Take 0.1 mg daily by mouth.  . levothyroxine (SYNTHROID, LEVOTHROID) 100 MCG tablet   . lovastatin (MEVACOR) 40 MG tablet   . omeprazole (PRILOSEC) 40 MG capsule   . potassium chloride (MICRO-K) 10 MEQ CR capsule   . rizatriptan (MAXALT) 10 MG tablet    Current Facility-Administered Medications (Other)  Medication Route  . Bevacizumab (AVASTIN) SOLN 1.25 mg Intravitreal  . Bevacizumab (AVASTIN) SOLN 1.25 mg Intravitreal  . Bevacizumab (AVASTIN) SOLN 1.25 mg Intravitreal      REVIEW OF SYSTEMS: ROS    Positive for: Eyes, Respiratory   Negative for: Constitutional, Gastrointestinal, Neurological, Skin, Genitourinary, Musculoskeletal, HENT, Endocrine, Cardiovascular, Psychiatric, Allergic/Imm, Heme/Lymph   Last edited by Zenovia Jordan, LPN on 0/96/4383  8:18 PM. (History)       ALLERGIES Allergies  Allergen Reactions  . Augmentin [Amoxicillin-Pot Clavulanate] Anaphylaxis  . Trihexyphenidyl Hcl Anaphylaxis  . Vancomycin Anaphylaxis  . Amoxicillin Hives  . Penicillins Hives  . Piperacillin Hives  . Potassium-Containing Compounds   . Sodium Acetylsalicylate [Aspirin] Hives  . Soma [Carisoprodol] Hives  . Linezolid Rash    PAST MEDICAL HISTORY Past Medical History:  Diagnosis Date  . Ankle fracture   . Neuropathy   . Stevens-Johnson disease [facility])    Past Surgical History:  Procedure Laterality Date  . ABDOMINAL HYSTERECTOMY    .  CATARACT EXTRACTION  2014   OU  . ELBOW FRACTURE SURGERY    . GALLBLADDER SURGERY    . KNEE ARTHROSCOPY    . LUNG LOBECTOMY    . SHOULDER SURGERY    . TONSILLECTOMY      FAMILY HISTORY Family History  Problem Relation Age of Onset  . Macular degeneration Mother   . Glaucoma Father     SOCIAL HISTORY Social History   Tobacco Use  . Smoking status: Never Smoker  . Smokeless tobacco: Never Used  Substance Use Topics  . Alcohol use: No    Frequency: Never   . Drug use: No         OPHTHALMIC EXAM:  Base Eye Exam    Visual Acuity (Snellen - Linear)      Right Left   Dist [place] 20/50 +2 20/25 -1   Dist ph [place] 20/40 -1 NI       Tonometry (Tonopen, 1:55 PM)      Right Left   Pressure 18 17       Pupils      Dark Light Shape React APD   Right 4 3 Round Brisk None   Left 4 3 Round Brisk None       Visual Fields (Counting fingers)      Left Right    Full Full       Extraocular Movement      Right Left    Ortho Ortho    -- -- --  --  --  -- -- --   -- -- --  --  --  -- -- --         Neuro/Psych    Oriented x3:  Yes   Mood/Affect:  Normal       Dilation    Both eyes:  1.0% Mydriacyl, 2.5% Phenylephrine @ 1:57 PM        Slit Lamp and Fundus Exam    External Exam      Right Left   External Normal Normal       Slit Lamp Exam      Right Left   Lids/Lashes Dermatochalasis - upper lid Dermatochalasis - upper lid   Conjunctiva/Sclera White and quiet White and quiet   Cornea Arcus, 3+ Punctate epithelial erosions Arcus, 2+ Punctate epithelial erosions   Anterior Chamber Deep and quiet Deep and quiet   Iris Round and dilated to 42m; no NVI Round and dilated to 832m  Lens Posterior chamber intraocular lens, Nasal 1-2+ Posterior capsular opacification encroching on visual axis Posterior chamber intraocular lens, 1+ diffuse Posterior capsular opacification greatest peripherally, clearer centrally   Vitreous Vitreous syneresis, Posterior vitreous detachment Vitreous syneresis       Fundus Exam      Right Left   Disc Normal, vascular loops Normal   C/D Ratio 0.4 0.45   Macula intraretinal hemorrhages and Mas superior macula, mildly increased edema Flat, few nasal MAs   Vessels Tortuous Mild tortuosity    Periphery Attached, scattered dot hemorrhages, punctate pigmented lesion at 1200 Attached        Refraction    Manifest Refraction (Auto)      Sphere Cylinder Axis Dist VA   Right -0.75 +1.25 029 20/50   Left  -0.25 +0.50 097 20/20          IMAGING AND PROCEDURES  Imaging and Procedures for 04/04/17  OCT, Retina - OU - Both Eyes     Right Eye Quality was good.  Central Foveal Thickness: 265. Progression has worsened. Findings include intraretinal fluid, no SRF, normal foveal contour (Interval increase in IRF, no SRF).   Left Eye Quality was good. Central Foveal Thickness: 267. Progression has been stable. Findings include normal foveal contour, no IRF, no SRF, vitreomacular adhesion .   Notes Images taken, stored on drive  Diagnosis / Impression:  OD: BRVO with interval wrosening of IRF OS: VMA, No IRF, No SRF  Clinical management:  See below  Abbreviations: NFP - Normal foveal profile. CME - cystoid macular edema. PED - pigment epithelial detachment. IRF - intraretinal fluid. SRF - subretinal fluid. EZ - ellipsoid zone. ERM - epiretinal membrane. ORA - outer retinal atrophy. ORT - outer retinal tubulation. SRHM - subretinal hyper-reflective material         Intravitreal Injection, Pharmacologic Agent - OD - Right Eye     Time Out 04/04/2017. 3:08 PM. Confirmed correct patient, procedure, site, and patient consented.   Anesthesia Topical anesthesia was used. Anesthetic medications included Lidocaine 2%, Tetracaine 0.5%.   Procedure Preparation included 5% betadine to ocular surface, eyelid speculum. A supplied needle was used.   Injection: 1.25 mg Bevacizumab 1.68m/0.05ml   NDC: 503009-233-00   Lot: [id number]'@1'     Expiration Date: 04/24/2017   Route: Intravitreal   Site: Right Eye   Waste: 0 mg  Post-op Post injection exam found visual acuity of at least counting fingers. The patient tolerated the procedure well. There were no complications. The patient received written and verbal post procedure care education.                 ASSESSMENT/PLAN:    ICD-10-CM   1. Branch retinal vein occlusion of right eye with macular edema H34.8310 OCT, Retina - OU - Both Eyes     Intravitreal Injection, Pharmacologic Agent - OD - Right Eye    Bevacizumab (AVASTIN) SOLN 1.25 mg  2. Retinal edema H35.81   3. Vitreomacular adhesion of left eye H43.822   4. Hypertensive retinopathy of both eyes H35.033   5. Pseudophakia of both eyes Z96.1   6. PCO (posterior capsular opacification), bilateral H26.493   7. Stevens-Johnson syndrome (HCC) L51.1     1. BRVO with ME OD-  - [place], was receiving unknown injections, treat and extend, OD - last TX injection OD in June 2018 - subjective worsening of vision OD since July - s/p IVA OD #1 (11.13.18), #2 (12.12.18) - today, OCT shows interval worsening of IRF at 5 week interval - BCVA decrease to 20/40-1 OD from 20/30 last month - recommend IVA OD #3 today, 01.15.19 - RBA of procedure discussed, questions answered - informed consent obtained and signed - see procedure note - F/U 4 weeks -- DFE/OCT/possible injection  2. VMA OS - mild without traction - stable - monitor  3. Hypertensive Retinopathy OU-  - stable - discussed importance of tight BP control - monitor  4. Pseudophakia OU-  - s/p CE/IOL OU - beautiful surgery, doing well - monitor  5. PCO OU-  - visually significant OD>OS - recommend YAG Cap procedure OD at next visit - F/U next Thursday at 10am  6. SJS w/ mild DES OU - recommend artificial tears and lubricating ointment as needed   Ophthalmic Meds Ordered this visit:  Meds ordered this encounter  Medications  . Bevacizumab (AVASTIN) SOLN 1.25 mg       Return in about 9 days (around 04/13/2017) for Yag Cap OD.  There  are no Patient Instructions on file for this visit.   Explained the diagnoses, plan, and follow up with the patient and they expressed understanding.  Patient expressed understanding of the importance of proper follow up care.   This document serves as a record of services personally performed by Gardiner Sleeper, MD, PhD. It was created on their  behalf by Catha Brow, [place], a certified ophthalmic assistant. The creation of this record is the provider's dictation and/or activities during the visit.  Electronically signed by: Catha Brow, Pump Back  04/04/17 4:00 PM    Gardiner Sleeper, M.D., Ph.D. Diseases & Surgery of the Retina and [place] 04/04/17   I have reviewed the above documentation for accuracy and completeness, and I agree with the above. Gardiner Sleeper, M.D., Ph.D. 04/04/17 4:00 PM    Abbreviations: M myopia (nearsighted); A astigmatism; H hyperopia (farsighted); P presbyopia; Mrx spectacle prescription;  CTL contact lenses; OD right eye; OS left eye; OU both eyes  XT exotropia; ET esotropia; PEK punctate epithelial keratitis; PEE punctate epithelial erosions; DES dry eye syndrome; MGD meibomian gland dysfunction; ATs artificial tears; PFAT's preservative free artificial tears; [place] nuclear sclerotic cataract; PSC posterior subcapsular cataract; ERM epi-retinal membrane; PVD posterior vitreous detachment; RD retinal detachment; DM diabetes mellitus; DR diabetic retinopathy; NPDR non-proliferative diabetic retinopathy; PDR proliferative diabetic retinopathy; CSME clinically significant macular edema; DME diabetic macular edema; dbh dot blot hemorrhages; CWS cotton wool spot; POAG primary open angle glaucoma; C/D cup-to-disc ratio; HVF humphrey visual field; GVF goldmann visual field; OCT optical coherence tomography; IOP intraocular pressure; BRVO Branch retinal vein occlusion; CRVO central retinal vein occlusion; CRAO central retinal artery occlusion; BRAO branch retinal artery occlusion; RT retinal tear; SB scleral buckle; PPV pars plana vitrectomy; VH Vitreous hemorrhage; PRP panretinal laser photocoagulation; IVK intravitreal kenalog; VMT vitreomacular traction; MH Macular hole;  NVD neovascularization of the disc; NVE neovascularization elsewhere; AREDS age related eye disease study; ARMD  age related macular degeneration; POAG primary open angle glaucoma; EBMD epithelial/anterior basement membrane dystrophy; ACIOL anterior chamber intraocular lens; IOL intraocular lens; PCIOL posterior chamber intraocular lens; Phaco/IOL phacoemulsification with intraocular lens placement; [place] photorefractive keratectomy; LASIK laser assisted in situ keratomileusis; HTN hypertension; DM diabetes mellitus; COPD chronic obstructive pulmonary disease

## 2017-04-04 ENCOUNTER — Encounter (INDEPENDENT_AMBULATORY_CARE_PROVIDER_SITE_OTHER): Payer: Self-pay | Admitting: Ophthalmology

## 2017-04-04 ENCOUNTER — Ambulatory Visit (INDEPENDENT_AMBULATORY_CARE_PROVIDER_SITE_OTHER): Payer: Medicare HMO | Admitting: Ophthalmology

## 2017-04-04 DIAGNOSIS — H35033 Hypertensive retinopathy, bilateral: Secondary | ICD-10-CM

## 2017-04-04 DIAGNOSIS — L511 Stevens-Johnson syndrome: Secondary | ICD-10-CM | POA: Diagnosis not present

## 2017-04-04 DIAGNOSIS — Z961 Presence of intraocular lens: Secondary | ICD-10-CM

## 2017-04-04 DIAGNOSIS — H26493 Other secondary cataract, bilateral: Secondary | ICD-10-CM | POA: Diagnosis not present

## 2017-04-04 DIAGNOSIS — H43822 Vitreomacular adhesion, left eye: Secondary | ICD-10-CM | POA: Diagnosis not present

## 2017-04-04 DIAGNOSIS — H34831 Tributary (branch) retinal vein occlusion, right eye, with macular edema: Secondary | ICD-10-CM

## 2017-04-04 MED ORDER — BEVACIZUMAB CHEMO INJECTION 1.25MG/0.05ML SYRINGE FOR KALEIDOSCOPE
1.2500 mg | INTRAVITREAL | Status: AC
  Administered 2017-04-04: 1.25 mg via INTRAVITREAL

## 2017-04-12 NOTE — Progress Notes (Signed)
[facility] Note  04/13/2017     CHIEF COMPLAINT Patient presents for Retina Follow Up   HISTORY OF PRESENT ILLNESS: Natalie Johnson is a 72 y.o. female who presents to the clinic today for:   HPI    Retina Follow Up    Patient presents with  Other.  In right eye.  This started 2 years ago.  Severity is mild.  Duration of 24 hours.  Since onset it is stable.  I, the attending physician,  performed the HPI with the patient and updated documentation appropriately.          Comments    F/U PCO OU . Patient states she continues to have floaters (which is film like covering) OD. Denies glare, flashes and ocular pain. Pt states she is ready to have yag today OD. Pt is using Dry eye gtts when she remembers to apply them. Denies Vits       Last edited by [name], MD on 04/13/2017  9:53 AM. (History)      Referring physician: [name], MD [address] [place], [place] [address]  HISTORICAL INFORMATION:   Selected notes from the [place] Referral from Dr. Read Drivers for concern of BRVO OD;  LEE- 11.8.18 (Dr. Read Drivers) Ocular Hx- pseudophakia OU (2015, in [place], Surgeon: unknown), Hx of injections for 'retinal bleeding' [last injection around June 2018], SJS, HTN ret OU;  PMH- SJS, thyroid disease, high chol., fibromyalgia;    CURRENT MEDICATIONS: Current Outpatient Medications (Ophthalmic Drugs)  Medication Sig  . prednisoLONE acetate (PRED FORTE) 1 % ophthalmic suspension Place 1 drop into the right eye 4 (four) times daily for 7 days.   No current facility-administered medications for this visit.  (Ophthalmic Drugs)   Current Outpatient Medications (Other)  Medication Sig  . amitriptyline (ELAVIL) 100 MG tablet   . clonazePAM (KLONOPIN) 2 MG tablet   . DULoxetine (CYMBALTA) 30 MG capsule   . fludrocortisone (FLORINEF) 0.1 MG tablet Take 0.1 mg daily by mouth.  . levothyroxine (SYNTHROID, LEVOTHROID) 100 MCG tablet   .  lovastatin (MEVACOR) 40 MG tablet   . omeprazole (PRILOSEC) 40 MG capsule   . potassium chloride (MICRO-K) 10 MEQ CR capsule   . rizatriptan (MAXALT) 10 MG tablet    Current Facility-Administered Medications (Other)  Medication Route  . Bevacizumab (AVASTIN) SOLN 1.25 mg Intravitreal  . Bevacizumab (AVASTIN) SOLN 1.25 mg Intravitreal  . Bevacizumab (AVASTIN) SOLN 1.25 mg Intravitreal      REVIEW OF SYSTEMS: ROS    Positive for: Eyes   Negative for: Constitutional, Gastrointestinal, Neurological, Skin, Genitourinary, Musculoskeletal, HENT, Endocrine, Cardiovascular, Respiratory, Psychiatric, Allergic/Imm, Heme/Lymph   Last edited by Zenovia Jordan, LPN on 4/49/2010  0:71 AM. (History)       ALLERGIES Allergies  Allergen Reactions  . Augmentin [Amoxicillin-Pot Clavulanate] Anaphylaxis  . Trihexyphenidyl Hcl Anaphylaxis  . Vancomycin Anaphylaxis  . Amoxicillin Hives  . Penicillins Hives  . Piperacillin Hives  . Potassium-Containing Compounds   . Sodium Acetylsalicylate [Aspirin] Hives  . Soma [Carisoprodol] Hives  . Linezolid Rash    PAST MEDICAL HISTORY Past Medical History:  Diagnosis Date  . Ankle fracture   . Neuropathy   . Stevens-Johnson disease [facility])    Past Surgical History:  Procedure Laterality Date  . ABDOMINAL HYSTERECTOMY    . CATARACT EXTRACTION  2014   OU  . ELBOW FRACTURE SURGERY    . GALLBLADDER SURGERY    . KNEE ARTHROSCOPY    .  LUNG LOBECTOMY    . SHOULDER SURGERY    . TONSILLECTOMY      FAMILY HISTORY Family History  Problem Relation Age of Onset  . Macular degeneration Mother   . Glaucoma Father     SOCIAL HISTORY Social History   Tobacco Use  . Smoking status: Never Smoker  . Smokeless tobacco: Never Used  Substance Use Topics  . Alcohol use: No    Frequency: Never  . Drug use: No         OPHTHALMIC EXAM:  Base Eye Exam    Visual Acuity (Snellen - Linear)      Right Left   Dist [place] 20/60 -2 20/25   Dist ph   20/40 -1 20/20 -1       Tonometry (Tonopen, 9:33 AM)      Right Left   Pressure 11 14       Pupils      Dark Light Shape React APD   Right 4 3 Round Brisk None   Left 4 3 Round Brisk None       Visual Fields (Counting fingers)      Left Right    Full Full       Extraocular Movement      Right Left    Full, Ortho Full, Ortho       Neuro/Psych    Oriented x3:  Yes   Mood/Affect:  Normal       Dilation    Both eyes:  1.0% Mydriacyl, 2.5% Phenylephrine @ 9:34 AM        Slit Lamp and Fundus Exam    External Exam      Right Left   External Normal Normal       Slit Lamp Exam      Right Left   Lids/Lashes Dermatochalasis - upper lid Dermatochalasis - upper lid   Conjunctiva/Sclera White and quiet White and quiet   Cornea Arcus, 3+ Punctate epithelial erosions Arcus, 2+ Punctate epithelial erosions   Anterior Chamber Deep and quiet Deep and quiet   Iris Round and dilated to 30m; no NVI Round and dilated to 89m  Lens Posterior chamber intraocular lens, Nasal 1-2+ Posterior capsular opacification encroching on visual axis Posterior chamber intraocular lens, 1+ diffuse Posterior capsular opacification greatest peripherally, clearer centrally   Vitreous Vitreous syneresis, Posterior vitreous detachment Vitreous syneresis       Fundus Exam      Right Left   Disc Normal, vascular loops Normal   C/D Ratio 0.4 0.45   Macula intraretinal hemorrhages and Mas superior macula, mildly increased edema Flat, few nasal MAs   Vessels Tortuous Mild tortuosity    Periphery Attached, scattered dot hemorrhages, punctate pigmented lesion at 1200 Attached          IMAGING AND PROCEDURES  Imaging and Procedures for 04/13/17  Yag Capsulotomy - OD - Right Eye     Procedure note: YAG Capsulotomy, RIGHT Eye  Informed consent obtained. Pre-op dilating drops (1% Topicamide and 2.5% Phenylephrine), and topical anesthesia given. Power: 6.3 mJ Shots: 32 Posterior capsulotomy in can  opener formation performed Patient tolerated procedure well. Of note, pt with significant epithelial irregularity and dryness preventing laser uptake inferiorly. Rx pred forte 4 times a day for 5 days, then stop. Pt received written and verbal post laser education. Recheck in 1-2 weeks w/ dilated exam -- may need touch up laser  ASSESSMENT/PLAN:    ICD-10-CM   1. PCO (posterior capsular opacification), bilateral H26.493 Yag Capsulotomy - OD - Right Eye  2. Branch retinal vein occlusion of right eye with macular edema H34.8310   3. Vitreomacular adhesion of left eye H43.822   4. Hypertensive retinopathy of both eyes H35.033   5. Pseudophakia of both eyes Z96.1   6. Stevens-Johnson syndrome (HCC) L51.1   7. Retinal edema H35.81 CANCELED: OCT, Retina - OU - Both Eyes    1. PCO OU-  - visually significant OD>OS - recommend YAG Cap procedure OD today (01.24.19) - pt wishes to proceed - RBA of procedure discussed, questions answered - informed consent obtained and signed - see procedure note -- notably irregular epi and significant PEE inferiorly disrupting laser - F/U as scheduled for #2 -- may need touch up YAG laser at that time  2. BRVO with ME OD-  - moved here from [place] in July, was receiving unknown injections, treat and extend, OD - last TX injection OD in June 2018 - subjective worsening of vision OD since July - s/p IVA OD #1 (11.13.18), #2 (12.12.18), #3 (01.15.19) - today, OCT shows interval worsening of IRF at 5 week interval - BCVA decrease to 20/40-1 OD from 20/30 last month - F/U 4 weeks -- DFE/OCT/possible injection  3. VMA OS - mild without traction - stable - monitor  4. Hypertensive Retinopathy OU-  - stable - discussed importance of tight BP control - monitor  5. Pseudophakia OU-  - s/p CE/IOL OU - beautiful surgery, doing well - monitor  6. SJS w/ mild DES OU - recommend artificial tears and lubricating ointment as  needed   Ophthalmic Meds Ordered this visit:  Meds ordered this encounter  Medications  . prednisoLONE acetate (PRED FORTE) 1 % ophthalmic suspension    Sig: Place 1 drop into the right eye 4 (four) times daily for 7 days.    Dispense:  10 mL    Refill:  0       Return in about 3 weeks (around 05/03/2017) for as scheduled.  There are no Patient Instructions on file for this visit.   Explained the diagnoses, plan, and follow up with the patient and they expressed understanding.  Patient expressed understanding of the importance of proper follow up care.   This document serves as a record of services personally performed by Gardiner Sleeper, MD, PhD. It was created on their behalf by Catha Brow, [place], a certified ophthalmic assistant. The creation of this record is the provider's dictation and/or activities during the visit.  Electronically signed by: Catha Brow, COA  04/13/17 10:30 AM    Gardiner Sleeper, M.D., Ph.D. Diseases & Surgery of the Retina and [place] 04/13/17    I have reviewed the above documentation for accuracy and completeness, and I agree with the above. Gardiner Sleeper, M.D., Ph.D. 04/13/17 10:30 AM     Abbreviations: M myopia (nearsighted); A astigmatism; H hyperopia (farsighted); P presbyopia; Mrx spectacle prescription;  CTL contact lenses; OD right eye; OS left eye; OU both eyes  XT exotropia; ET esotropia; PEK punctate epithelial keratitis; PEE punctate epithelial erosions; DES dry eye syndrome; MGD meibomian gland dysfunction; ATs artificial tears; PFAT's preservative free artificial tears; [place] nuclear sclerotic cataract; PSC posterior subcapsular cataract; ERM epi-retinal membrane; PVD posterior vitreous detachment; RD retinal detachment; DM diabetes mellitus; DR diabetic retinopathy; NPDR non-proliferative diabetic retinopathy; PDR proliferative diabetic retinopathy; CSME clinically significant macular  edema;  DME diabetic macular edema; dbh dot blot hemorrhages; CWS cotton wool spot; POAG primary open angle glaucoma; C/D cup-to-disc ratio; HVF humphrey visual field; GVF goldmann visual field; OCT optical coherence tomography; IOP intraocular pressure; BRVO Branch retinal vein occlusion; CRVO central retinal vein occlusion; CRAO central retinal artery occlusion; BRAO branch retinal artery occlusion; RT retinal tear; SB scleral buckle; PPV pars plana vitrectomy; VH Vitreous hemorrhage; PRP panretinal laser photocoagulation; IVK intravitreal kenalog; VMT vitreomacular traction; MH Macular hole;  NVD neovascularization of the disc; NVE neovascularization elsewhere; AREDS age related eye disease study; ARMD age related macular degeneration; POAG primary open angle glaucoma; EBMD epithelial/anterior basement membrane dystrophy; ACIOL anterior chamber intraocular lens; IOL intraocular lens; PCIOL posterior chamber intraocular lens; Phaco/IOL phacoemulsification with intraocular lens placement; [place] photorefractive keratectomy; LASIK laser assisted in situ keratomileusis; HTN hypertension; DM diabetes mellitus; COPD chronic obstructive pulmonary disease

## 2017-04-13 ENCOUNTER — Encounter (INDEPENDENT_AMBULATORY_CARE_PROVIDER_SITE_OTHER): Payer: Self-pay | Admitting: Ophthalmology

## 2017-04-13 ENCOUNTER — Ambulatory Visit (INDEPENDENT_AMBULATORY_CARE_PROVIDER_SITE_OTHER): Payer: Medicare HMO | Admitting: Ophthalmology

## 2017-04-13 DIAGNOSIS — H26493 Other secondary cataract, bilateral: Secondary | ICD-10-CM

## 2017-04-13 DIAGNOSIS — Z961 Presence of intraocular lens: Secondary | ICD-10-CM

## 2017-04-13 DIAGNOSIS — L511 Stevens-Johnson syndrome: Secondary | ICD-10-CM

## 2017-04-13 DIAGNOSIS — H35033 Hypertensive retinopathy, bilateral: Secondary | ICD-10-CM

## 2017-04-13 DIAGNOSIS — H34831 Tributary (branch) retinal vein occlusion, right eye, with macular edema: Secondary | ICD-10-CM

## 2017-04-13 DIAGNOSIS — H43822 Vitreomacular adhesion, left eye: Secondary | ICD-10-CM

## 2017-04-13 DIAGNOSIS — H3581 Retinal edema: Secondary | ICD-10-CM

## 2017-04-13 MED ORDER — PREDNISOLONE ACETATE 1 % OP SUSP: 1.0000 [drp] | Freq: Four times a day (QID) | OPHTHALMIC | 0 refills | Status: AC

## 2017-05-02 NOTE — Progress Notes (Signed)
[facility] Note  05/03/2017     CHIEF COMPLAINT Patient presents for Retina Follow Up   HISTORY OF PRESENT ILLNESS: Natalie Johnson is a 72 y.o. female who presents to the clinic today for:   HPI    Retina Follow Up    Patient presents with  CRVO/BRVO.  In right eye.  This started 7 months ago.  Severity is mild.  Since onset it is stable.  I, the attending physician,  performed the HPI with the patient and updated documentation appropriately.          Comments    Pt presents for s/p yag cap OD/BRVO with ME OD. Pt states vision still blurred OD. Pt using blink eye gtts for dry eye bid OD. Seems to help with dryness. Vision seems stable OD.       Last edited by [name], MD on 05/03/2017  2:35 PM. (History)      Referring physician: [name], MD [place], [place] [id number]  HISTORICAL INFORMATION:   Selected notes from the [place] Referral from Dr. Read Drivers for concern of BRVO OD;  LEE- 11.8.18 (Dr. Read Drivers) Ocular Hx- pseudophakia OU (2015, in [place], Surgeon: unknown), Hx of injections for 'retinal bleeding' [last injection around June 2018], SJS, HTN ret OU;  PMH- SJS, thyroid disease, high chol., fibromyalgia;    CURRENT MEDICATIONS: No current outpatient medications on file. (Ophthalmic Drugs)   No current facility-administered medications for this visit.  (Ophthalmic Drugs)   Current Outpatient Medications (Other)  Medication Sig  . amitriptyline (ELAVIL) 100 MG tablet   . clonazePAM (KLONOPIN) 2 MG tablet   . DULoxetine (CYMBALTA) 30 MG capsule   . fludrocortisone (FLORINEF) 0.1 MG tablet Take 0.1 mg daily by mouth.  . levothyroxine (SYNTHROID, LEVOTHROID) 100 MCG tablet   . lovastatin (MEVACOR) 40 MG tablet   . omeprazole (PRILOSEC) 40 MG capsule   . potassium chloride (MICRO-K) 10 MEQ CR capsule   . rizatriptan (MAXALT) 10 MG tablet    Current Facility-Administered Medications (Other)   Medication Route  . Bevacizumab (AVASTIN) SOLN 1.25 mg Intravitreal  . Bevacizumab (AVASTIN) SOLN 1.25 mg Intravitreal  . Bevacizumab (AVASTIN) SOLN 1.25 mg Intravitreal  . Bevacizumab (AVASTIN) SOLN 1.25 mg Intravitreal      REVIEW OF SYSTEMS: ROS    Positive for: Eyes, Respiratory   Negative for: Constitutional, Gastrointestinal, Neurological, Skin, Genitourinary, Musculoskeletal, HENT, Endocrine, Cardiovascular, Psychiatric, Allergic/Imm, Heme/Lymph   Last edited by [name] on 05/03/2017  2:07 PM. (History)       ALLERGIES Allergies  Allergen Reactions  . Augmentin [Amoxicillin-Pot Clavulanate] Anaphylaxis  . Trihexyphenidyl Hcl Anaphylaxis  . Vancomycin Anaphylaxis  . Amoxicillin Hives  . Penicillins Hives  . Piperacillin Hives  . Potassium-Containing Compounds   . Sodium Acetylsalicylate [Aspirin] Hives  . Soma [Carisoprodol] Hives  . Linezolid Rash    PAST MEDICAL HISTORY Past Medical History:  Diagnosis Date  . Ankle fracture   . Neuropathy   . Stevens-Johnson disease [facility])    Past Surgical History:  Procedure Laterality Date  . ABDOMINAL HYSTERECTOMY    . CATARACT EXTRACTION  2014   OU  . ELBOW FRACTURE SURGERY    . GALLBLADDER SURGERY    . KNEE ARTHROSCOPY    . LUNG LOBECTOMY    . SHOULDER SURGERY    . TONSILLECTOMY      FAMILY HISTORY Family History  Problem Relation Age of Onset  .  Macular degeneration Mother   . Glaucoma Father     SOCIAL HISTORY Social History   Tobacco Use  . Smoking status: Never Smoker  . Smokeless tobacco: Never Used  Substance Use Topics  . Alcohol use: No    Frequency: Never  . Drug use: No         OPHTHALMIC EXAM:  Base Eye Exam    Visual Acuity (Snellen - Linear)      Right Left   Dist [place] 20/40 20/25 +1   Dist ph [place] 20/40 +2 NI       Tonometry (Tonopen, 2:16 PM)      Right Left   Pressure 13 10       Pupils      Dark Light Shape React APD   Right 4 3 Round Slow None   Left 4 3  Round Slow None       Visual Fields (Counting fingers)      Left Right    Full Full       Extraocular Movement      Right Left    Full, Ortho Full, Ortho       Neuro/Psych    Oriented x3:  Yes   Mood/Affect:  Normal       Dilation    Both eyes:  1.0% Mydriacyl, 2.5% Phenylephrine @ 2:16 PM        Slit Lamp and Fundus Exam    External Exam      Right Left   External Normal Normal       Slit Lamp Exam      Right Left   Lids/Lashes Dermatochalasis - upper lid Dermatochalasis - upper lid   Conjunctiva/Sclera White and quiet White and quiet   Cornea Arcus, 1+ Punctate epithelial erosions Arcus, 2+ Punctate epithelial erosions   Anterior Chamber Deep and quiet Deep and quiet   Iris Round and dilated to 65m; no NVI Round and dilated to 858m  Lens Posterior chamber intraocular lens, Open PC, inferior boarder of PC is 22m58mbove lens edge Posterior chamber intraocular lens, 1+ diffuse Posterior capsular opacification greatest peripherally, clearer centrally   Vitreous Vitreous syneresis, Posterior vitreous detachment Vitreous syneresis       Fundus Exam      Right Left   Disc Normal, vascular loops Normal   C/D Ratio 0.4 0.45   Macula Blunted foveal reflex, intraretinal hemorrhages and MAs superior macula, mildly increased edema Flat, few nasal MAs   Vessels Tortuous Mild tortuosity    Periphery Attached, scattered dot hemorrhages, punctate pigmented lesion at 1200 Attached        Refraction    Manifest Refraction      Sphere Cylinder Axis Dist [place]   Right -0.75 +0.75 020 20/30+2   Left              IMAGING AND PROCEDURES  Imaging and Procedures for 05/03/17  OCT, Retina - OU - Both Eyes     Right Eye Quality was good. Central Foveal Thickness: 298. Progression has worsened. Findings include intraretinal fluid, no SRF, abnormal foveal contour (Interval increase in IRF, no SRF).   Left Eye Quality was good. Central Foveal Thickness: 261. Progression has been  stable. Findings include normal foveal contour, no IRF, no SRF, vitreomacular adhesion .   Notes Images taken, stored on drive  Diagnosis / Impression:  OD: BRVO with interval wrosening of IRF OS: VMA, No IRF, No SRF  Clinical management:  See below  Abbreviations: NFP - Normal foveal profile. CME - cystoid macular edema. PED - pigment epithelial detachment. IRF - intraretinal fluid. SRF - subretinal fluid. EZ - ellipsoid zone. ERM - epiretinal membrane. ORA - outer retinal atrophy. ORT - outer retinal tubulation. SRHM - subretinal hyper-reflective material         Intravitreal Injection, Pharmacologic Agent - OD - Right Eye     Time Out 05/03/2017. 3:09 PM. Confirmed correct patient, procedure, site, and patient consented.   Anesthesia Topical anesthesia was used. Anesthetic medications included Lidocaine 2%, Tetracaine 0.5%.   Procedure Preparation included 5% betadine to ocular surface, eyelid speculum. A supplied needle was used.   Injection: 1.25 mg Bevacizumab 1.70m/0.05ml   NDC: 599357-017-79   Lot: [id number]'     Expiration Date: 06/07/2017   Route: Intravitreal   Site: Right Eye   Waste: 0 mg  Post-op Post injection exam found visual acuity of at least counting fingers. The patient tolerated the procedure well. There were no complications. The patient received written and verbal post procedure care education.                 ASSESSMENT/PLAN:    ICD-10-CM   1. PCO (posterior capsular opacification), bilateral H26.493 Intravitreal Injection, Pharmacologic Agent - OD - Right Eye    Bevacizumab (AVASTIN) SOLN 1.25 mg  2. Branch retinal vein occlusion of right eye with macular edema H34.8310 OCT, Retina - OU - Both Eyes  3. Vitreomacular adhesion of left eye H43.822   4. Hypertensive retinopathy of both eyes H35.033   5. Pseudophakia of both eyes Z96.1   6. Stevens-Johnson syndrome (HCC) L51.1   7. Retinal edema H35.81 OCT, Retina - OU - Both Eyes     1. PCO OU-  - S/P YAG Cap procedure OD (01.24.19) - notably irregular epi and significant PEE inferiorly disrupting laser during procedure - today, very minimal residual PCO inferiorly outside of visual axis -- recommend monitoring for now - may touch up at later time  2. BRVO with ME OD-  - [place], was receiving unknown injections, treat and extend, OD - last TX injection OD in June 2018 - subjective worsening of vision OD since July - s/p IVA OD #1 (11.13.18), #2 (12.12.18), #3 (01.15.19) - today, OCT shows interval worsening of IRF at 4 week interval - BCVA stable at 20/40-1 OD; was 20/30 in November of 2018 - recommend IVA OD #4 today (02.13.19) - RBA of procedure discussed, questions answered - informed consent obtained and signed - see procedure note - discussed possibility of reduced efficacy of avastin with recent treatments - will have pt fill out and sign Eylea paper work for benefits investigation - F/U 4 weeks -- DFE/OCT/possible switch to Eylea  3. VMA OS - mild without traction - stable - monitor  4. Hypertensive Retinopathy OU-  - stable - discussed importance of tight BP control - monitor  5. Pseudophakia OU-  - s/p CE/IOL OU - beautiful surgery, doing well - monitor  6. SJS w/ mild DES OU - recommend artificial tears and lubricating ointment as needed   Ophthalmic Meds Ordered this visit:  Meds ordered this encounter  Medications  . Bevacizumab (AVASTIN) SOLN 1.25 mg       Return in about 4 weeks (around 05/31/2017) for F/U BRVO w/ ME OD - poss Eylea.  There are no Patient Instructions on file for this visit.   Explained the diagnoses, plan, and follow up with the patient  and they expressed understanding.  Patient expressed understanding of the importance of proper follow up care.   This document serves as a record of services personally performed by Gardiner Sleeper, MD, PhD. It was created on their behalf by Catha Brow, [place], a certified ophthalmic assistant. The creation of this record is the provider's dictation and/or activities during the visit.  Electronically signed by: Catha Brow, COA  05/03/17 5:18 PM    Gardiner Sleeper, M.D., Ph.D. Diseases & Surgery of the Retina and Vitreous [facility] 05/03/17   I have reviewed the above documentation for accuracy and completeness, and I agree with the above. Gardiner Sleeper, M.D., Ph.D. 05/03/17 5:18 PM     Abbreviations: M myopia (nearsighted); A astigmatism; H hyperopia (farsighted); P presbyopia; Mrx spectacle prescription;  CTL contact lenses; OD right eye; OS left eye; OU both eyes  XT exotropia; ET esotropia; PEK punctate epithelial keratitis; PEE punctate epithelial erosions; DES dry eye syndrome; MGD meibomian gland dysfunction; ATs artificial tears; PFAT's preservative free artificial tears; [place] nuclear sclerotic cataract; PSC posterior subcapsular cataract; ERM epi-retinal membrane; PVD posterior vitreous detachment; RD retinal detachment; DM diabetes mellitus; DR diabetic retinopathy; NPDR non-proliferative diabetic retinopathy; PDR proliferative diabetic retinopathy; CSME clinically significant macular edema; DME diabetic macular edema; dbh dot blot hemorrhages; CWS cotton wool spot; POAG primary open angle glaucoma; C/D cup-to-disc ratio; HVF humphrey visual field; GVF goldmann visual field; OCT optical coherence tomography; IOP intraocular pressure; BRVO Branch retinal vein occlusion; CRVO central retinal vein occlusion; CRAO central retinal artery occlusion; BRAO branch retinal artery occlusion; RT retinal tear; SB scleral buckle; PPV pars plana vitrectomy; VH Vitreous hemorrhage; PRP panretinal laser photocoagulation; IVK intravitreal kenalog; VMT vitreomacular traction; MH Macular hole;  NVD neovascularization of the disc; NVE neovascularization elsewhere; AREDS age related eye disease study; ARMD age related  macular degeneration; POAG primary open angle glaucoma; EBMD epithelial/anterior basement membrane dystrophy; ACIOL anterior chamber intraocular lens; IOL intraocular lens; PCIOL posterior chamber intraocular lens; Phaco/IOL phacoemulsification with intraocular lens placement; [place] photorefractive keratectomy; LASIK laser assisted in situ keratomileusis; HTN hypertension; DM diabetes mellitus; COPD chronic obstructive pulmonary disease

## 2017-05-03 ENCOUNTER — Ambulatory Visit (INDEPENDENT_AMBULATORY_CARE_PROVIDER_SITE_OTHER): Payer: Medicare HMO | Admitting: Ophthalmology

## 2017-05-03 ENCOUNTER — Encounter (INDEPENDENT_AMBULATORY_CARE_PROVIDER_SITE_OTHER): Payer: Self-pay | Admitting: Ophthalmology

## 2017-05-03 DIAGNOSIS — L511 Stevens-Johnson syndrome: Secondary | ICD-10-CM

## 2017-05-03 DIAGNOSIS — H26493 Other secondary cataract, bilateral: Secondary | ICD-10-CM | POA: Diagnosis not present

## 2017-05-03 DIAGNOSIS — H3581 Retinal edema: Secondary | ICD-10-CM | POA: Diagnosis not present

## 2017-05-03 DIAGNOSIS — H34831 Tributary (branch) retinal vein occlusion, right eye, with macular edema: Secondary | ICD-10-CM | POA: Diagnosis not present

## 2017-05-03 DIAGNOSIS — H35033 Hypertensive retinopathy, bilateral: Secondary | ICD-10-CM

## 2017-05-03 DIAGNOSIS — Z961 Presence of intraocular lens: Secondary | ICD-10-CM

## 2017-05-03 DIAGNOSIS — H43822 Vitreomacular adhesion, left eye: Secondary | ICD-10-CM | POA: Diagnosis not present

## 2017-05-03 MED ORDER — BEVACIZUMAB CHEMO INJECTION 1.25MG/0.05ML SYRINGE FOR KALEIDOSCOPE
1.2500 mg | INTRAVITREAL | Status: AC
  Administered 2017-05-03: 1.25 mg via INTRAVITREAL

## 2017-06-02 NOTE — Progress Notes (Signed)
[facility] Note  06/05/2017     CHIEF COMPLAINT Patient presents for Retina Follow Up   HISTORY OF PRESENT ILLNESS: Natalie Johnson is a 72 y.o. female who presents to the clinic today for:   HPI    Retina Follow Up    Patient presents with  Other.  In right eye.  Severity is mild.  Since onset it is gradually improving.  I, the attending physician,  performed the HPI with the patient and updated documentation appropriately.          Comments    F/U BRVO w/Me OD. Patient states she has occasional dots "circle with holes" in her peripheral vision and film that moves back and forth but she feels her vision has improved, she can read things that she once could not. Pt reports she fell three weeks ago at [employer] and hit her head, no injury voiced. Pt is ready for Tx today Od       Last edited by [name], MD on 06/05/2017  2:57 PM. (History)      Referring physician: [name], MD [place], [place] [id number]  HISTORICAL INFORMATION:   Selected notes from the [place] Referral from Dr. Read Drivers for concern of BRVO OD;  LEE- 11.8.18 (Dr. Read Drivers) Ocular Hx- pseudophakia OU (2015, in [place], Surgeon: unknown), Hx of injections for 'retinal bleeding' [last injection around June 2018], SJS, HTN ret OU;  PMH- SJS, thyroid disease, high chol., fibromyalgia;    CURRENT MEDICATIONS: No current outpatient medications on file. (Ophthalmic Drugs)   No current facility-administered medications for this visit.  (Ophthalmic Drugs)   Current Outpatient Medications (Other)  Medication Sig  . amitriptyline (ELAVIL) 100 MG tablet   . clonazePAM (KLONOPIN) 2 MG tablet   . DULoxetine (CYMBALTA) 30 MG capsule   . fludrocortisone (FLORINEF) 0.1 MG tablet Take 0.1 mg daily by mouth.  . levothyroxine (SYNTHROID, LEVOTHROID) 100 MCG tablet   . lovastatin (MEVACOR) 40 MG tablet   . omeprazole (PRILOSEC) 40 MG capsule   .  potassium chloride (MICRO-K) 10 MEQ CR capsule   . rizatriptan (MAXALT) 10 MG tablet    Current Facility-Administered Medications (Other)  Medication Route  . Bevacizumab (AVASTIN) SOLN 1.25 mg Intravitreal  . Bevacizumab (AVASTIN) SOLN 1.25 mg Intravitreal  . Bevacizumab (AVASTIN) SOLN 1.25 mg Intravitreal  . Bevacizumab (AVASTIN) SOLN 1.25 mg Intravitreal  . Bevacizumab (AVASTIN) SOLN 1.25 mg Intravitreal      REVIEW OF SYSTEMS: ROS    Positive for: Eyes   Negative for: Constitutional, Gastrointestinal, Neurological, Skin, Genitourinary, Musculoskeletal, HENT, Endocrine, Cardiovascular, Respiratory, Psychiatric, Allergic/Imm, Heme/Lymph   Last edited by Zenovia Jordan, LPN on 6/57/8469  6:29 PM. (History)       ALLERGIES Allergies  Allergen Reactions  . Augmentin [Amoxicillin-Pot Clavulanate] Anaphylaxis  . Trihexyphenidyl Hcl Anaphylaxis  . Vancomycin Anaphylaxis  . Amoxicillin Hives  . Penicillins Hives  . Piperacillin Hives  . Potassium-Containing Compounds   . Sodium Acetylsalicylate [Aspirin] Hives  . Soma [Carisoprodol] Hives  . Linezolid Rash    PAST MEDICAL HISTORY Past Medical History:  Diagnosis Date  . Ankle fracture   . Neuropathy   . Stevens-Johnson disease [facility])    Past Surgical History:  Procedure Laterality Date  . ABDOMINAL HYSTERECTOMY    . CATARACT EXTRACTION  2014   OU  . ELBOW FRACTURE SURGERY    . GALLBLADDER SURGERY    . KNEE ARTHROSCOPY    .  LUNG LOBECTOMY    . SHOULDER SURGERY    . TONSILLECTOMY      FAMILY HISTORY Family History  Problem Relation Age of Onset  . Macular degeneration Mother   . Glaucoma Father     SOCIAL HISTORY Social History   Tobacco Use  . Smoking status: Never Smoker  . Smokeless tobacco: Never Used  Substance Use Topics  . Alcohol use: No    Frequency: Never  . Drug use: No         OPHTHALMIC EXAM:  Base Eye Exam    Visual Acuity (Snellen - Linear)      Right Left   Dist [place] 20/40  20/25   Dist ph [place] 20/25 -1 20/25 +1       Tonometry (Tonopen, 2:34 PM)      Right Left   Pressure 21 15       Pupils      Dark Light Shape React APD   Right 4 3 Round Brisk None   Left 4 3 Round Brisk None       Visual Fields (Counting fingers)      Left Right    Full Full       Extraocular Movement      Right Left    Full, Ortho Full, Ortho       Neuro/Psych    Oriented x3:  Yes   Mood/Affect:  Normal       Dilation    Both eyes:  1.0% Mydriacyl, 2.5% Phenylephrine @ 2:34 PM        Slit Lamp and Fundus Exam    External Exam      Right Left   External Normal Normal       Slit Lamp Exam      Right Left   Lids/Lashes Dermatochalasis - upper lid Dermatochalasis - upper lid   Conjunctiva/Sclera White and quiet White and quiet   Cornea Arcus, trace Punctate epithelial erosions Arcus, Inferior 1+ Punctate epithelial erosions   Anterior Chamber Deep and quiet Deep and quiet   Iris Round and dilated to 73m; no NVI Round and dilated to 819m  Lens Posterior chamber intraocular lens, Open PC, inferior boarder of PC is 37m237mbove lens edge Posterior chamber intraocular lens, 1+ diffuse Posterior capsular opacification greatest peripherally, clearer centrally   Vitreous Vitreous syneresis, Posterior vitreous detachment Vitreous syneresis       Fundus Exam      Right Left   Disc Normal, vascular loops Normal   C/D Ratio 0.4 0.45   Macula Blunted foveal reflex, intraretinal hemorrhages and MAs superior macula -- improved, persistent but improved edema Flat, few nasal MAs   Vessels Tortuous Mild tortuosity    Periphery Attached, rare dot hemorrhages, punctate pigmented lesion at 1200 Attached          IMAGING AND PROCEDURES  Imaging and Procedures for 06/05/17  OCT, Retina - OU - Both Eyes     Right Eye Quality was good. Central Foveal Thickness: 289. Progression has improved. Findings include intraretinal fluid, no SRF, abnormal foveal contour (Mild interval  improvement in IRF, no SRF).   Left Eye Quality was good. Central Foveal Thickness: 261. Progression has been stable. Findings include normal foveal contour, no IRF, no SRF, vitreomacular adhesion .   Notes Images taken, stored on drive  Diagnosis / Impression:  OD: BRVO with mild interval improvement of IRF OS: VMA, No IRF, No SRF  Clinical management:  See below  Abbreviations:  NFP - Normal foveal profile. CME - cystoid macular edema. PED - pigment epithelial detachment. IRF - intraretinal fluid. SRF - subretinal fluid. EZ - ellipsoid zone. ERM - epiretinal membrane. ORA - outer retinal atrophy. ORT - outer retinal tubulation. SRHM - subretinal hyper-reflective material         Intravitreal Injection, Pharmacologic Agent - OD - Right Eye     Time Out 06/05/2017. 3:07 PM. Confirmed correct patient, procedure, site, and patient consented.   Anesthesia Topical anesthesia was used. Anesthetic medications included Lidocaine 2%, Tetracaine 0.5%.   Procedure Preparation included 5% betadine to ocular surface, eyelid speculum. A supplied needle was used.   Injection: 1.25 mg Bevacizumab 1.63m/0.05ml   NDC: 534193-790-24   Lot: [id number]<\[id number]    Expiration Date: 07/02/2017   Route: Intravitreal   Site: Right Eye   Waste: 0 mg  Post-op Post injection exam found visual acuity of at least counting fingers. The patient tolerated the procedure well. There were no complications. The patient received written and verbal post procedure care education.                 ASSESSMENT/PLAN:    ICD-10-CM   1. Branch retinal vein occlusion of right eye with macular edema H34.8310 OCT, Retina - OU - Both Eyes    Intravitreal Injection, Pharmacologic Agent - OD - Right Eye    Bevacizumab (AVASTIN) SOLN 1.25 mg  2. Vitreomacular adhesion of left eye H43.822   3. Hypertensive retinopathy of both eyes H35.033   4. Pseudophakia of both eyes Z96.1   5. PCO (posterior capsular  opacification), bilateral H26.493   6. Stevens-Johnson syndrome (HCC) L51.1     1. BR[place] with [place] OD-  [place] injections, treat and extend, OD - last TX injection OD in June 2018 - subjective worsening of vision OD since July - s/p IVA OD #1 (11.13.18), #2 (12.12.18), #3 (01.15.19), #4 (02.13.19) - today, OCT shows interval improvement of IRF at 4 week interval - BCVA improved to 20/25 today; was 20/30 in November of 2018 - recommend IVA OD #5 today (03.18.19) - RBA of procedure discussed, questions answered - informed consent obtained and signed - see procedure note - discussed possibility of reduced efficacy of avastin with recent treatments - will have pt fill out Eylea paperwork and have benefits investigation - F/U 4 weeks -- DFE/OCT/possible switch to Eylea  2. VMA OS - mild without traction - stable - monitor  3. Hypertensive Retinopathy OU-  - stable - discussed importance of tight BP control - monitor  4. Pseudophakia OU-  - s/p CE/IOL OU - beautiful surgery, doing well - monitor  5. PCO OU-  - S/P YAG Cap procedure OD (01.24.19) - notably irregular epi and significant PEE inferiorly disrupting laser during procedure - today, very minimal residual PCO inferiorly outside of visual axis -- recommend monitoring for now - may touch up at later time  6. SJS w/ mild DES OU - recommend artificial tears and lubricating ointment as needed   Ophthalmic Meds Ordered this visit:  Meds ordered this encounter  Medications  . Bevacizumab (AVASTIN) SOLN 1.25 mg       Return in about 4 weeks (around 07/03/2017) for F/U BRVO w/ ME OD .  There are no Patient Instructions on file for this visit.   Explained the diagnoses, plan, and follow up with the patient and they expressed understanding.  Patient expressed understanding of the importance  of proper follow up care.   This document serves as a record of services personally  performed by Gardiner Sleeper, MD, PhD. It was created on their behalf by Catha Brow, [place], a certified ophthalmic assistant. The creation of this record is the provider's dictation and/or activities during the visit.  Electronically signed by: Catha Brow, [place]  06/05/17 3:18 PM   Gardiner Sleeper, M.D., Ph.D. Diseases & Surgery of the Retina and [place] 06/05/17   I have reviewed the above documentation for accuracy and completeness, and I agree with the above. Gardiner Sleeper, M.D., Ph.D. 06/05/17 3:18 PM     Abbreviations: M myopia (nearsighted); A astigmatism; H hyperopia (farsighted); P presbyopia; Mrx spectacle prescription;  CTL contact lenses; OD right eye; OS left eye; OU both eyes  XT exotropia; ET esotropia; PEK punctate epithelial keratitis; PEE punctate epithelial erosions; DES dry eye syndrome; MGD meibomian gland dysfunction; ATs artificial tears; PFAT's preservative free artificial tears; [place] nuclear sclerotic cataract; PSC posterior subcapsular cataract; ERM epi-retinal membrane; PVD posterior vitreous detachment; RD retinal detachment; DM diabetes mellitus; DR diabetic retinopathy; NPDR non-proliferative diabetic retinopathy; PDR proliferative diabetic retinopathy; CSME clinically significant macular edema; DME diabetic macular edema; dbh dot blot hemorrhages; CWS cotton wool spot; POAG primary open angle glaucoma; C/D cup-to-disc ratio; HVF humphrey visual field; GVF goldmann visual field; OCT optical coherence tomography; IOP intraocular pressure; BRVO Branch retinal vein occlusion; CRVO central retinal vein occlusion; CRAO central retinal artery occlusion; BRAO branch retinal artery occlusion; RT retinal tear; SB scleral buckle; PPV pars plana vitrectomy; VH Vitreous hemorrhage; PRP panretinal laser photocoagulation; IVK intravitreal kenalog; VMT vitreomacular traction; MH Macular hole;  NVD neovascularization of the disc; NVE  neovascularization elsewhere; AREDS age related eye disease study; ARMD age related macular degeneration; POAG primary open angle glaucoma; EBMD epithelial/anterior basement membrane dystrophy; ACIOL anterior chamber intraocular lens; IOL intraocular lens; PCIOL posterior chamber intraocular lens; Phaco/IOL phacoemulsification with intraocular lens placement; [place] photorefractive keratectomy; LASIK laser assisted in situ keratomileusis; HTN hypertension; DM diabetes mellitus; COPD chronic obstructive pulmonary disease

## 2017-06-05 ENCOUNTER — Encounter (INDEPENDENT_AMBULATORY_CARE_PROVIDER_SITE_OTHER): Payer: Self-pay | Admitting: Ophthalmology

## 2017-06-05 ENCOUNTER — Ambulatory Visit (INDEPENDENT_AMBULATORY_CARE_PROVIDER_SITE_OTHER): Payer: Medicare HMO | Admitting: Ophthalmology

## 2017-06-05 DIAGNOSIS — H26493 Other secondary cataract, bilateral: Secondary | ICD-10-CM | POA: Diagnosis not present

## 2017-06-05 DIAGNOSIS — H43822 Vitreomacular adhesion, left eye: Secondary | ICD-10-CM | POA: Diagnosis not present

## 2017-06-05 DIAGNOSIS — L511 Stevens-Johnson syndrome: Secondary | ICD-10-CM | POA: Diagnosis not present

## 2017-06-05 DIAGNOSIS — H34831 Tributary (branch) retinal vein occlusion, right eye, with macular edema: Secondary | ICD-10-CM

## 2017-06-05 DIAGNOSIS — Z961 Presence of intraocular lens: Secondary | ICD-10-CM | POA: Diagnosis not present

## 2017-06-05 DIAGNOSIS — H35033 Hypertensive retinopathy, bilateral: Secondary | ICD-10-CM

## 2017-06-05 MED ORDER — BEVACIZUMAB CHEMO INJECTION 1.25MG/0.05ML SYRINGE FOR KALEIDOSCOPE
1.2500 mg | INTRAVITREAL | Status: AC
  Administered 2017-06-05: 1.25 mg via INTRAVITREAL

## 2017-06-08 DIAGNOSIS — L308 Other specified dermatitis: Secondary | ICD-10-CM | POA: Diagnosis not present

## 2017-06-08 DIAGNOSIS — I8310 Varicose veins of unspecified lower extremity with inflammation: Secondary | ICD-10-CM | POA: Diagnosis not present

## 2017-07-03 NOTE — Progress Notes (Signed)
[facility] Note  07/04/2017     CHIEF COMPLAINT Patient presents for Retina Follow Up   HISTORY OF PRESENT ILLNESS: Natalie Johnson is a 72 y.o. female who presents to the clinic today for:   HPI    Retina Follow Up    Patient presents with  Other.  In right eye.  This started 4 months ago.  Severity is mild.  Since onset it is stable.  I, the attending physician,  performed the HPI with the patient and updated documentation appropriately.          Comments    F/U BRVO w/ME OD. Patient states her Ara Kussmaul is about the same ,constant floaters and film that moves with eye movement OD. Patient is ready for tx today if indicated. Denies vit's         Last edited by [name], MD on 07/04/2017  1:56 PM. (History)      Referring physician: [name], MD [place], [place] [address]  HISTORICAL INFORMATION:   Selected notes from the [place] Referral from Dr. Read Drivers for concern of BRVO OD;  LEE- 11.8.18 (Dr. Read Drivers) Ocular Hx- pseudophakia OU (2015, in [place], Surgeon: unknown), Hx of injections for 'retinal bleeding' [last injection around June 2018], SJS, HTN ret OU;  PMH- SJS, thyroid disease, high chol., fibromyalgia;    CURRENT MEDICATIONS: No current outpatient medications on file. (Ophthalmic Drugs)   Current Facility-Administered Medications (Ophthalmic Drugs)  Medication Route  . aflibercept (EYLEA) SOLN 2 mg Intravitreal   Current Outpatient Medications (Other)  Medication Sig  . amitriptyline (ELAVIL) 100 MG tablet   . clonazePAM (KLONOPIN) 2 MG tablet   . DULoxetine (CYMBALTA) 30 MG capsule   . fludrocortisone (FLORINEF) 0.1 MG tablet Take 0.1 mg daily by mouth.  . levothyroxine (SYNTHROID, LEVOTHROID) 100 MCG tablet   . lovastatin (MEVACOR) 40 MG tablet   . omeprazole (PRILOSEC) 40 MG capsule   . potassium chloride (MICRO-K) 10 MEQ CR capsule   . rizatriptan (MAXALT) 10 MG tablet    Current  Facility-Administered Medications (Other)  Medication Route  . Bevacizumab (AVASTIN) SOLN 1.25 mg Intravitreal  . Bevacizumab (AVASTIN) SOLN 1.25 mg Intravitreal  . Bevacizumab (AVASTIN) SOLN 1.25 mg Intravitreal  . Bevacizumab (AVASTIN) SOLN 1.25 mg Intravitreal  . Bevacizumab (AVASTIN) SOLN 1.25 mg Intravitreal      REVIEW OF SYSTEMS: ROS    Positive for: Eyes   Negative for: Constitutional, Gastrointestinal, Neurological, Skin, Genitourinary, Musculoskeletal, HENT, Endocrine, Cardiovascular, Respiratory, Psychiatric, Allergic/Imm, Heme/Lymph   Last edited by Zenovia Jordan, LPN on 4/62/7035  0:09 PM. (History)       ALLERGIES Allergies  Allergen Reactions  . Augmentin [Amoxicillin-Pot Clavulanate] Anaphylaxis  . Trihexyphenidyl Hcl Anaphylaxis  . Vancomycin Anaphylaxis  . Amoxicillin Hives  . Penicillins Hives  . Piperacillin Hives  . Potassium-Containing Compounds   . Sodium Acetylsalicylate [Aspirin] Hives  . Soma [Carisoprodol] Hives  . Linezolid Rash    PAST MEDICAL HISTORY Past Medical History:  Diagnosis Date  . Ankle fracture   . Neuropathy   . Stevens-Johnson disease [facility])    Past Surgical History:  Procedure Laterality Date  . ABDOMINAL HYSTERECTOMY    . CATARACT EXTRACTION  2014   OU  . ELBOW FRACTURE SURGERY    . GALLBLADDER SURGERY    . KNEE ARTHROSCOPY    . LUNG LOBECTOMY    . SHOULDER SURGERY    . TONSILLECTOMY  FAMILY HISTORY Family History  Problem Relation Age of Onset  . Macular degeneration Mother   . Glaucoma Father     SOCIAL HISTORY Social History   Tobacco Use  . Smoking status: Never Smoker  . Smokeless tobacco: Never Used  Substance Use Topics  . Alcohol use: No    Frequency: Never  . Drug use: No         OPHTHALMIC EXAM:  Base Eye Exam    Visual Acuity (Snellen - Linear)      Right Left   Dist [place] 20/40 -2 20/25 -2   Dist ph [place] 20/30 +2 20/20 -2       Tonometry (Tonopen, 1:37 PM)      Right Left    Pressure 15 16       Pupils      Dark Light Shape React APD   Right 5 4 Round Brisk None   Left 5 4 Round Brisk None       Visual Fields (Counting fingers)      Left Right    Full Full       Extraocular Movement      Right Left    Full, Ortho Full, Ortho       Neuro/Psych    Oriented x3:  Yes   Mood/Affect:  Normal       Dilation    Both eyes:  1.0% Mydriacyl, 2.5% Phenylephrine @ 1:37 PM        Slit Lamp and Fundus Exam    External Exam      Right Left   External Normal Normal       Slit Lamp Exam      Right Left   Lids/Lashes Dermatochalasis - upper lid Dermatochalasis - upper lid   Conjunctiva/Sclera White and quiet White and quiet   Cornea Arcus, trace Punctate epithelial erosions Arcus, Inferior 1+ Punctate epithelial erosions   Anterior Chamber Deep and quiet Deep and quiet   Iris Round and dilated to 49m; no NVI Round and dilated to 853m  Lens Posterior chamber intraocular lens, Open PC, inferior boarder of PC is 35m54mbove lens edge Posterior chamber intraocular lens, 1+ diffuse Posterior capsular opacification greatest peripherally, clearer centrally   Vitreous Vitreous syneresis, Posterior vitreous detachment Vitreous syneresis       Fundus Exam      Right Left   Disc Normal, vascular loops Normal   C/D Ratio 0.4 0.45   Macula Blunted foveal reflex, intraretinal hemorrhages and MAs superior macula -- improved, persistent edema/cystic changes Flat, few nasal MAs   Vessels Tortuous Mild tortuosity    Periphery Attached, rare dot hemorrhages, punctate pigmented lesion at 1200 Attached          IMAGING AND PROCEDURES  Imaging and Procedures for 07/04/17  OCT, Retina - OU - Both Eyes       Right Eye Quality was good. Central Foveal Thickness: 264. Progression has improved. Findings include intraretinal fluid, no SRF, abnormal foveal contour (Mild improvement in IRF, no SRF).   Left Eye Quality was good. Central Foveal Thickness: 261.  Progression has been stable. Findings include normal foveal contour, no IRF, no SRF, vitreomacular adhesion .   Notes Images taken, stored on drive  Diagnosis / Impression:  OD: BRVO with mild interval improvement of IRF OS: VMA, No IRF, No SRF  Clinical management:  See below  Abbreviations: NFP - Normal foveal profile. CME - cystoid macular edema. PED - pigment epithelial detachment. IRF -  intraretinal fluid. SRF - subretinal fluid. EZ - ellipsoid zone. ERM - epiretinal membrane. ORA - outer retinal atrophy. ORT - outer retinal tubulation. SRHM - subretinal hyper-reflective material         Intravitreal Injection, Pharmacologic Agent - OD - Right Eye       Time Out 07/04/2017. 2:08 PM. Confirmed correct patient, procedure, site, and patient consented.   Anesthesia Topical anesthesia was used. Anesthetic medications included Lidocaine 2%, Tetracaine 0.5%.   Procedure Preparation included 5% betadine to ocular surface, eyelid speculum. A supplied needle was used.   Injection: 2 mg aflibercept 2 MG/0.05ML   NDC: 62831-517-61    Lot: [id number]    Expiration Date: 02/06/2018   Route: Intravitreal   Site: Right Eye   Waste: 0.05 mg  Post-op Post injection exam found visual acuity of at least counting fingers. The patient tolerated the procedure well. There were no complications. The patient received written and verbal post procedure care education. Post injection medications were not given.                 ASSESSMENT/PLAN:    ICD-10-CM   1. Branch retinal vein occlusion of right eye with macular edema H34.8310 OCT, Retina - OU - Both Eyes    Intravitreal Injection, Pharmacologic Agent - OD - Right Eye    aflibercept (EYLEA) SOLN 2 mg  2. Vitreomacular adhesion of left eye H43.822   3. Hypertensive retinopathy of both eyes H35.033   4. Pseudophakia of both eyes Z96.1   5. PCO (posterior capsular opacification), bilateral H26.493   6. Stevens-Johnson syndrome  (HCC) L51.1   7. Retinal edema H35.81     1. BRVO with ME OD-  - moved here from [place] in July, was receiving unknown injections, treat and extend, OD - last TX injection OD in June 2018 - initially presented with subjective worsening of vision OD since July - s/p IVA OD #1 (11.13.18), #2 (12.12.18), #3 (01.15.19), #4 (02.13.19), #5 (03.18.19) - today, OCT shows mild interval improvement of IRF at 4 week interval - BCVA remains 20/30 OD today; - discussed possibility of switching to [facility] - pt approved for Eylea and wishes to switch meds - recommend IVE OD today (04.16.19) - RBA of procedure discussed, questions answered - informed consent obtained and signed - see procedure note - F/U 4 weeks -- DFE/OCT/possible IVE  2. VMA OS - mild without traction - stable - monitor  3. Hypertensive Retinopathy OU-  - stable - discussed importance of tight BP control - monitor  4. Pseudophakia OU-  - s/p CE/IOL OU - beautiful surgery, doing well - monitor  5. PCO OU-  - S/P YAG Cap procedure OD (01.24.19) - notably irregular epi and significant PEE inferiorly disrupting laser during procedure - today, very minimal residual PCO inferiorly outside of visual axis -- recommend monitoring for now - may touch up at a later time  6. SJS w/ mild DES OU - continue using artificial tear and lubricating ointment as needed  Pt requests referral to plastic surgeon for lid eval - will try to get in touch with Dr. Toy Cookey   Ophthalmic Meds Ordered this visit:  Meds ordered this encounter  Medications  . aflibercept (EYLEA) SOLN 2 mg       Return in about 1 month (around 08/01/2017) for F/u BRVO w/ ME OD, Dilated exam, OCT.  There are no Patient Instructions on file for this visit.   Explained the diagnoses, plan, and follow up with  the patient and they expressed understanding.  Patient expressed understanding of the importance of proper follow up care.   This document serves as a  record of services personally performed by Gardiner Sleeper, MD, PhD. It was created on their behalf by Catha Brow, [place], a certified ophthalmic assistant. The creation of this record is the provider's dictation and/or activities during the visit.  Electronically signed by: Catha Brow, [place]  07/04/17 2:52 PM   Gardiner Sleeper, M.D., Ph.D. Diseases & Surgery of the Retina and [place] 07/04/17  I have reviewed the above documentation for accuracy and completeness, and I agree with the above. Gardiner Sleeper, M.D., Ph.D. 07/04/17 2:54 PM    Abbreviations: M myopia (nearsighted); A astigmatism; H hyperopia (farsighted); P presbyopia; Mrx spectacle prescription;  CTL contact lenses; OD right eye; OS left eye; OU both eyes  XT exotropia; ET esotropia; PEK punctate epithelial keratitis; PEE punctate epithelial erosions; DES dry eye syndrome; MGD meibomian gland dysfunction; ATs artificial tears; PFAT's preservative free artificial tears; [place] nuclear sclerotic cataract; PSC posterior subcapsular cataract; ERM epi-retinal membrane; PVD posterior vitreous detachment; RD retinal detachment; DM diabetes mellitus; DR diabetic retinopathy; NPDR non-proliferative diabetic retinopathy; PDR proliferative diabetic retinopathy; CSME clinically significant macular edema; DME diabetic macular edema; dbh dot blot hemorrhages; CWS cotton wool spot; POAG primary open angle glaucoma; C/D cup-to-disc ratio; HVF humphrey visual field; GVF goldmann visual field; OCT optical coherence tomography; IOP intraocular pressure; BRVO Branch retinal vein occlusion; CRVO central retinal vein occlusion; CRAO central retinal artery occlusion; BRAO branch retinal artery occlusion; RT retinal tear; SB scleral buckle; PPV pars plana vitrectomy; VH Vitreous hemorrhage; PRP panretinal laser photocoagulation; IVK intravitreal kenalog; VMT vitreomacular traction; MH Macular hole;  NVD neovascularization  of the disc; NVE neovascularization elsewhere; AREDS age related eye disease study; ARMD age related macular degeneration; POAG primary open angle glaucoma; EBMD epithelial/anterior basement membrane dystrophy; ACIOL anterior chamber intraocular lens; IOL intraocular lens; PCIOL posterior chamber intraocular lens; Phaco/IOL phacoemulsification with intraocular lens placement; [place] photorefractive keratectomy; LASIK laser assisted in situ keratomileusis; HTN hypertension; DM diabetes mellitus; COPD chronic obstructive pulmonary disease

## 2017-07-04 ENCOUNTER — Encounter (INDEPENDENT_AMBULATORY_CARE_PROVIDER_SITE_OTHER): Payer: Self-pay | Admitting: Ophthalmology

## 2017-07-04 ENCOUNTER — Ambulatory Visit (INDEPENDENT_AMBULATORY_CARE_PROVIDER_SITE_OTHER): Payer: Medicare HMO | Admitting: Ophthalmology

## 2017-07-04 DIAGNOSIS — Z961 Presence of intraocular lens: Secondary | ICD-10-CM

## 2017-07-04 DIAGNOSIS — H34831 Tributary (branch) retinal vein occlusion, right eye, with macular edema: Secondary | ICD-10-CM | POA: Diagnosis not present

## 2017-07-04 DIAGNOSIS — L511 Stevens-Johnson syndrome: Secondary | ICD-10-CM

## 2017-07-04 DIAGNOSIS — H26493 Other secondary cataract, bilateral: Secondary | ICD-10-CM

## 2017-07-04 DIAGNOSIS — H43822 Vitreomacular adhesion, left eye: Secondary | ICD-10-CM

## 2017-07-04 DIAGNOSIS — H35033 Hypertensive retinopathy, bilateral: Secondary | ICD-10-CM

## 2017-07-04 DIAGNOSIS — H3581 Retinal edema: Secondary | ICD-10-CM

## 2017-07-04 MED ORDER — AFLIBERCEPT 2MG/0.05ML IZ SOLN FOR KALEIDOSCOPE
2.0000 mg | INTRAVITREAL | Status: AC
  Administered 2017-07-04: 2 mg via INTRAVITREAL

## 2017-08-07 NOTE — Progress Notes (Addendum)
[facility] Note  08/08/2017     CHIEF COMPLAINT Patient presents for Retina Follow Up   HISTORY OF PRESENT ILLNESS: Natalie Johnson is a 72 y.o. female who presents to the clinic today for:   HPI    Retina Follow Up    Patient presents with  CRVO/BRVO.  In right eye.  Severity is moderate.  Duration of 1 month.  Since onset it is stable.  I, the attending physician,  performed the HPI with the patient and updated documentation appropriately.          Comments    Pt presents for BRVO OD F/U, pt states she feels VA in OD is getting better, states she used to have a film over her eye and that seems to be gone now and just appears that there is a floater there, pt denies flashes, pain, or wavy vision, pt states she is having headaches lately but thinks it's due to the air outside, pt states she is prepared for another injection today        Last edited by [name], MD on 08/08/2017  2:56 PM. (History)      Referring physician: [name], MD [address] [place], [place] [address]  HISTORICAL INFORMATION:   Selected notes from the [facility] Referral from Dr. Read Drivers for concern of BRVO OD;  LEE- 11.8.18 (Dr. Read Drivers) Ocular Hx- pseudophakia OU (2015, in [place], Surgeon: unknown), Hx of injections for 'retinal bleeding' [last injection around June 2018], SJS, HTN ret OU;  PMH- SJS, thyroid disease, high chol., fibromyalgia;    CURRENT MEDICATIONS: No current outpatient medications on file. (Ophthalmic Drugs)   Current Facility-Administered Medications (Ophthalmic Drugs)  Medication Route  . aflibercept (EYLEA) SOLN 2 mg Intravitreal  . aflibercept (EYLEA) SOLN 2 mg Intravitreal   Current Outpatient Medications (Other)  Medication Sig  . amitriptyline (ELAVIL) 100 MG tablet   . clonazePAM (KLONOPIN) 2 MG tablet   . DULoxetine (CYMBALTA) 30 MG capsule   . fludrocortisone (FLORINEF) 0.1 MG tablet Take 0.1 mg daily by  mouth.  . levothyroxine (SYNTHROID, LEVOTHROID) 100 MCG tablet   . lovastatin (MEVACOR) 40 MG tablet   . omeprazole (PRILOSEC) 40 MG capsule   . potassium chloride (MICRO-K) 10 MEQ CR capsule   . rizatriptan (MAXALT) 10 MG tablet    Current Facility-Administered Medications (Other)  Medication Route  . Bevacizumab (AVASTIN) SOLN 1.25 mg Intravitreal  . Bevacizumab (AVASTIN) SOLN 1.25 mg Intravitreal  . Bevacizumab (AVASTIN) SOLN 1.25 mg Intravitreal  . Bevacizumab (AVASTIN) SOLN 1.25 mg Intravitreal  . Bevacizumab (AVASTIN) SOLN 1.25 mg Intravitreal      REVIEW OF SYSTEMS: ROS    Positive for: Eyes   Negative for: Constitutional, Gastrointestinal, Neurological, Skin, Genitourinary, Musculoskeletal, HENT, Endocrine, Cardiovascular, Respiratory, Psychiatric, Allergic/Imm, Heme/Lymph   Last edited by Debbrah Alar, COT on 08/08/2017  1:49 PM. (History)       ALLERGIES Allergies  Allergen Reactions  . Augmentin [Amoxicillin-Pot Clavulanate] Anaphylaxis  . Trihexyphenidyl Hcl Anaphylaxis  . Vancomycin Anaphylaxis  . Amoxicillin Hives  . Penicillins Hives  . Piperacillin Hives  . Potassium-Containing Compounds   . Sodium Acetylsalicylate [Aspirin] Hives  . Soma [Carisoprodol] Hives  . Linezolid Rash    PAST MEDICAL HISTORY Past Medical History:  Diagnosis Date  . Ankle fracture   . Neuropathy   . Stevens-Johnson disease [facility])    Past Surgical History:  Procedure Laterality Date  . ABDOMINAL HYSTERECTOMY    .  CATARACT EXTRACTION  2014   OU  . ELBOW FRACTURE SURGERY    . GALLBLADDER SURGERY    . KNEE ARTHROSCOPY    . LUNG LOBECTOMY    . SHOULDER SURGERY    . TONSILLECTOMY      FAMILY HISTORY Family History  Problem Relation Age of Onset  . Macular degeneration Mother   . Glaucoma Father     SOCIAL HISTORY Social History   Tobacco Use  . Smoking status: Never Smoker  . Smokeless tobacco: Never Used  Substance Use Topics  . Alcohol use: No     Frequency: Never  . Drug use: No         OPHTHALMIC EXAM:  Base Eye Exam    Visual Acuity (Snellen - Linear)      Right Left   Dist [place] 20/40 -1 20/25 -1   Dist ph [place] 20/30 -1 20/25 +1       Tonometry (Tonopen, 1:55 PM)      Right Left   Pressure 16 13       Pupils      Dark Light Shape React APD   Right 5 4 Round Slow None   Left 5 4 Round Slow None       Visual Fields (Counting fingers)      Left Right    Full Full       Extraocular Movement      Right Left    Full, Ortho Full, Ortho       Neuro/Psych    Oriented x3:  Yes   Mood/Affect:  Normal       Dilation    Both eyes:  1.0% Mydriacyl, 2.5% Phenylephrine @ 1:55 PM        Slit Lamp and Fundus Exam    External Exam      Right Left   External Normal Normal       Slit Lamp Exam      Right Left   Lids/Lashes Dermatochalasis - upper lid Dermatochalasis - upper lid   Conjunctiva/Sclera White and quiet White and quiet   Cornea Arcus, trace Punctate epithelial erosions Arcus, Inferior 1+ Punctate epithelial erosions   Anterior Chamber Deep and quiet Deep and quiet   Iris Round and dilated to 85m; no NVI Round and dilated to 885m  Lens Posterior chamber intraocular lens, Open PC, inferior boarder of PC is 26m3mbove lens edge Posterior chamber intraocular lens, 1+ diffuse Posterior capsular opacification greatest peripherally, clearer centrally   Vitreous Vitreous syneresis, Posterior vitreous detachment Vitreous syneresis       Fundus Exam      Right Left   Disc Normal, vascular loops Normal   C/D Ratio 0.4 0.45   Macula Blunted foveal reflex, intraretinal hemorrhages and MAs superior macula -- improved, persistent edema/cystic changes, pigment clumping nasal to fovea Flat, few nasal MAs   Vessels Tortuous Mild tortuosity    Periphery Attached, rare dot hemorrhages, punctate pigmented lesion at 1200 Attached          IMAGING AND PROCEDURES  Imaging and Procedures for 07/04/17  OCT, Retina - OU  - Both Eyes       Right Eye Quality was good. Central Foveal Thickness: 261. Progression has been stable. Findings include intraretinal fluid, no SRF, abnormal foveal contour (Stable to slight improvement in IRF, no SRF).   Left Eye Quality was good. Central Foveal Thickness: 260. Progression has been stable. Findings include normal foveal contour, no IRF, no SRF,  vitreomacular adhesion .   Notes Images taken, stored on drive  Diagnosis / Impression:  OD: BRVO with stable to slight improvement of IRF OS: VMA, No IRF, No SRF  Clinical management:  See below  Abbreviations: NFP - Normal foveal profile. CME - cystoid macular edema. PED - pigment epithelial detachment. IRF - intraretinal fluid. SRF - subretinal fluid. EZ - ellipsoid zone. ERM - epiretinal membrane. ORA - outer retinal atrophy. ORT - outer retinal tubulation. SRHM - subretinal hyper-reflective material         Intravitreal Injection, Pharmacologic Agent - OD - Right Eye       Time Out 08/08/2017. 3:19 PM. Confirmed correct patient, procedure, site, and patient consented.   Anesthesia Topical anesthesia was used. Anesthetic medications included Lidocaine 2%, Tetracaine 0.5%.   Procedure Preparation included 5% betadine to ocular surface, eyelid speculum. A supplied needle was used.   Injection: 2 mg aflibercept 2 MG/0.05ML   NDC: 53202-334-35    Lot: [id number]    Expiration Date: 04/20/2018   Route: Intravitreal   Site: Right Eye   Waste: 0.05 mg  Post-op Post injection exam found visual acuity of at least counting fingers. The patient tolerated the procedure well. There were no complications. The patient received written and verbal post procedure care education.                 ASSESSMENT/PLAN:    ICD-10-CM   1. Branch retinal vein occlusion of right eye with macular edema H34.8310 Intravitreal Injection, Pharmacologic Agent - OD - Right Eye    aflibercept (EYLEA) SOLN 2 mg  2. Vitreomacular  adhesion of left eye H43.822   3. Hypertensive retinopathy of both eyes H35.033   4. Pseudophakia of both eyes Z96.1   5. PCO (posterior capsular opacification), bilateral H26.493   6. Stevens-Johnson syndrome (HCC) L51.1   7. Retinal edema H35.81 OCT, Retina - OU - Both Eyes    1. BRVO with ME OD-  - moved here from [place] in July 2018, was receiving unknown injections, treat and extend, OD - last [place] injection OD in June 2018 - initially presented with subjective worsening of vision OD since July - s/p IVA OD #1 (11.13.18), #2 (12.12.18), #3 (01.15.19), #4 (02.13.19), #5 (03.18.19) - pt approved for Eylea - s/p IVE OD #1 (04.16.19) - today, OCT stable to slightly improved IRF at 5 week interval - BCVA remains 20/30 OD today - recommend IVE OD #2 today (05.21.19) - RBA of procedure discussed, questions answered - informed consent obtained and signed - see procedure note - F/U 4 weeks -- DFE/OCT/possible IVE  2. VMA OS - mild without traction - stable - monitor  3. Hypertensive Retinopathy OU-  - stable - discussed importance of tight BP control - monitor  4. Pseudophakia OU-  - s/p CE/IOL OU - beautiful surgery, doing well - monitor  5. PCO OU-  - S/P YAG Cap procedure OD (01.24.19) - notably irregular epi and significant PEE inferiorly disrupting laser during procedure - today, very minimal residual PCO inferiorly outside of visual axis -- recommend monitoring for now - may touch up at a later time  6. SJS w/ mild DES OU - continue using artificial tears and lubricating ointment as needed  Pt requests referral to plastic surgeon for lid eval - will try to get in touch with Dr. Toy Cookey   Ophthalmic Meds Ordered this visit:  Meds ordered this encounter  Medications  . aflibercept (EYLEA) SOLN 2 mg  Return in about 1 month (around 09/05/2017) for F/U BRVO w/ ME OD, DFE, OCT.  There are no Patient Instructions on file for this visit.   Explained the  diagnoses, plan, and follow up with the patient and they expressed understanding.  Patient expressed understanding of the importance of proper follow up care.   This document serves as a record of services personally performed by Gardiner Sleeper, MD, PhD. It was created on their behalf by Catha Brow, [place], a certified ophthalmic assistant. The creation of this record is the provider's dictation and/or activities during the visit.  Electronically signed by: Catha Brow, COA  05.20.19 11:52 PM   Gardiner Sleeper, M.D., Ph.D. Diseases & Surgery of the Retina and Vitreous [facility] 08/07/17  I have reviewed the above documentation for accuracy and completeness, and I agree with the above. Gardiner Sleeper, M.D., Ph.D. 08/08/17 11:54 PM    Abbreviations: M myopia (nearsighted); A astigmatism; H hyperopia (farsighted); P presbyopia; Mrx spectacle prescription;  CTL contact lenses; OD right eye; OS left eye; OU both eyes  XT exotropia; ET esotropia; PEK punctate epithelial keratitis; PEE punctate epithelial erosions; DES dry eye syndrome; MGD meibomian gland dysfunction; ATs artificial tears; PFAT's preservative free artificial tears; [place] nuclear sclerotic cataract; PSC posterior subcapsular cataract; ERM epi-retinal membrane; PVD posterior vitreous detachment; RD retinal detachment; DM diabetes mellitus; DR diabetic retinopathy; NPDR non-proliferative diabetic retinopathy; PDR proliferative diabetic retinopathy; CSME clinically significant macular edema; DME diabetic macular edema; dbh dot blot hemorrhages; CWS cotton wool spot; POAG primary open angle glaucoma; C/D cup-to-disc ratio; HVF humphrey visual field; GVF goldmann visual field; OCT optical coherence tomography; IOP intraocular pressure; BRVO Branch retinal vein occlusion; CRVO central retinal vein occlusion; CRAO central retinal artery occlusion; BRAO branch retinal artery occlusion; RT retinal tear; SB scleral  buckle; PPV pars plana vitrectomy; VH Vitreous hemorrhage; PRP panretinal laser photocoagulation; IVK intravitreal kenalog; VMT vitreomacular traction; MH Macular hole;  NVD neovascularization of the disc; NVE neovascularization elsewhere; AREDS age related eye disease study; ARMD age related macular degeneration; POAG primary open angle glaucoma; EBMD epithelial/anterior basement membrane dystrophy; ACIOL anterior chamber intraocular lens; IOL intraocular lens; PCIOL posterior chamber intraocular lens; Phaco/IOL phacoemulsification with intraocular lens placement; [place] photorefractive keratectomy; LASIK laser assisted in situ keratomileusis; HTN hypertension; DM diabetes mellitus; COPD chronic obstructive pulmonary disease

## 2017-08-08 ENCOUNTER — Encounter (INDEPENDENT_AMBULATORY_CARE_PROVIDER_SITE_OTHER): Payer: Self-pay | Admitting: Ophthalmology

## 2017-08-08 ENCOUNTER — Ambulatory Visit (INDEPENDENT_AMBULATORY_CARE_PROVIDER_SITE_OTHER): Payer: Medicare HMO | Admitting: Ophthalmology

## 2017-08-08 DIAGNOSIS — Z961 Presence of intraocular lens: Secondary | ICD-10-CM

## 2017-08-08 DIAGNOSIS — H35033 Hypertensive retinopathy, bilateral: Secondary | ICD-10-CM

## 2017-08-08 DIAGNOSIS — H26493 Other secondary cataract, bilateral: Secondary | ICD-10-CM

## 2017-08-08 DIAGNOSIS — H43822 Vitreomacular adhesion, left eye: Secondary | ICD-10-CM

## 2017-08-08 DIAGNOSIS — H3581 Retinal edema: Secondary | ICD-10-CM | POA: Diagnosis not present

## 2017-08-08 DIAGNOSIS — H34831 Tributary (branch) retinal vein occlusion, right eye, with macular edema: Secondary | ICD-10-CM | POA: Diagnosis not present

## 2017-08-08 DIAGNOSIS — L511 Stevens-Johnson syndrome: Secondary | ICD-10-CM

## 2017-08-08 MED ORDER — AFLIBERCEPT 2MG/0.05ML IZ SOLN FOR KALEIDOSCOPE
2.0000 mg | INTRAVITREAL | Status: AC
  Administered 2017-08-08: 2 mg via INTRAVITREAL

## 2017-09-07 NOTE — Progress Notes (Signed)
[facility] Note  09/11/2017     CHIEF COMPLAINT Patient presents for Retina Follow Up   HISTORY OF PRESENT ILLNESS: Natalie Johnson is a 72 y.o. female who presents to the clinic today for:   HPI    Retina Follow Up    Patient presents with  CRVO/BRVO.  In right eye.  Severity is moderate.  Since onset it is stable.  I, the attending physician,  performed the HPI with the patient and updated documentation appropriately.          Comments    Pt presents for BRVO OD F/U, pt states VA has been "okay" pt denies pain, flashes and wavy vision, pt states she sees floaters OD, pt uses OTC gtts PRN, pt is ready to receive another injection today       Last edited by [name], MD on 09/11/2017  2:37 PM. (History)      Referring physician: [name], MD [place], [place] [address]  HISTORICAL INFORMATION:   Selected notes from the [facility] Referral from Dr. Read Drivers for concern of BRVO OD;  LEE- 11.8.18 (Dr. Read Drivers) Ocular Hx- pseudophakia OU (2015, in [place], Surgeon: unknown), Hx of injections for 'retinal bleeding' [last injection around June 2018], SJS, HTN ret OU;  PMH- SJS, thyroid disease, high chol., fibromyalgia;    CURRENT MEDICATIONS: No current outpatient medications on file. (Ophthalmic Drugs)   Current Facility-Administered Medications (Ophthalmic Drugs)  Medication Route  . aflibercept (EYLEA) SOLN 2 mg Intravitreal  . aflibercept (EYLEA) SOLN 2 mg Intravitreal  . aflibercept (EYLEA) SOLN 2 mg Intravitreal   Current Outpatient Medications (Other)  Medication Sig  . amitriptyline (ELAVIL) 100 MG tablet   . clonazePAM (KLONOPIN) 2 MG tablet   . DULoxetine (CYMBALTA) 30 MG capsule   . fludrocortisone (FLORINEF) 0.1 MG tablet Take 0.1 mg daily by mouth.  . levothyroxine (SYNTHROID, LEVOTHROID) 100 MCG tablet   . lovastatin (MEVACOR) 40 MG tablet   . omeprazole (PRILOSEC) 40 MG capsule   .  potassium chloride (MICRO-K) 10 MEQ CR capsule   . rizatriptan (MAXALT) 10 MG tablet    Current Facility-Administered Medications (Other)  Medication Route  . Bevacizumab (AVASTIN) SOLN 1.25 mg Intravitreal  . Bevacizumab (AVASTIN) SOLN 1.25 mg Intravitreal  . Bevacizumab (AVASTIN) SOLN 1.25 mg Intravitreal  . Bevacizumab (AVASTIN) SOLN 1.25 mg Intravitreal  . Bevacizumab (AVASTIN) SOLN 1.25 mg Intravitreal      REVIEW OF SYSTEMS: ROS    Positive for: Eyes   Negative for: Constitutional, Gastrointestinal, Neurological, Skin, Genitourinary, Musculoskeletal, HENT, Endocrine, Cardiovascular, Respiratory, Psychiatric, Allergic/Imm, Heme/Lymph   Last edited by Debbrah Alar, COT on 09/11/2017  1:44 PM. (History)       ALLERGIES Allergies  Allergen Reactions  . Augmentin [Amoxicillin-Pot Clavulanate] Anaphylaxis  . Trihexyphenidyl Hcl Anaphylaxis  . Vancomycin Anaphylaxis  . Amoxicillin Hives  . Penicillins Hives  . Piperacillin Hives  . Potassium-Containing Compounds   . Sodium Acetylsalicylate [Aspirin] Hives  . Soma [Carisoprodol] Hives  . Linezolid Rash    PAST MEDICAL HISTORY Past Medical History:  Diagnosis Date  . Ankle fracture   . Neuropathy   . Stevens-Johnson disease [facility])    Past Surgical History:  Procedure Laterality Date  . ABDOMINAL HYSTERECTOMY    . CATARACT EXTRACTION  2014   OU  . ELBOW FRACTURE SURGERY    . GALLBLADDER SURGERY    . KNEE ARTHROSCOPY    . LUNG LOBECTOMY    .  SHOULDER SURGERY    . TONSILLECTOMY      FAMILY HISTORY Family History  Problem Relation Age of Onset  . Macular degeneration Mother   . Glaucoma Father     SOCIAL HISTORY Social History   Tobacco Use  . Smoking status: Never Smoker  . Smokeless tobacco: Never Used  Substance Use Topics  . Alcohol use: No    Frequency: Never  . Drug use: No         OPHTHALMIC EXAM:  Base Eye Exam    Visual Acuity (Snellen - Linear)      Right Left   Dist [place] 20/50 -1  20/25 -2   Dist ph [place] 20/40 20/25       Tonometry (Tonopen, 1:49 PM)      Right Left   Pressure 18 14       Pupils      Dark Light Shape React APD   Right 5 3 Round Brisk None   Left 5 3 Round Brisk None       Visual Fields (Counting fingers)      Left Right    Full Full       Extraocular Movement      Right Left    Full, Ortho Full, Ortho       Neuro/Psych    Oriented x3:  Yes   Mood/Affect:  Normal       Dilation    Both eyes:  1.0% Mydriacyl, 2.5% Phenylephrine @ 1:49 PM        Slit Lamp and Fundus Exam    External Exam      Right Left   External Normal Normal       Slit Lamp Exam      Right Left   Lids/Lashes Dermatochalasis - upper lid Dermatochalasis - upper lid   Conjunctiva/Sclera White and quiet White and quiet   Cornea Arcus, 2+ Punctate epithelial erosions Arcus, 1+ Punctate epithelial erosions   Anterior Chamber Deep and quiet Deep and quiet   Iris Round and dilated to 29m; no NVI Round and dilated to 835m  Lens Posterior chamber intraocular lens, Open PC, inferior boarder of PC is [id number] lens edge Posterior chamber intraocular lens, 1+ diffuse Posterior capsular opacification greatest peripherally, clearer centrally   Vitreous Vitreous syneresis, Posterior vitreous detachment Vitreous syneresis       Fundus Exam      Right Left   Disc Normal, vascular loops Normal   C/D Ratio 0.4 0.45   Macula Blunted foveal reflex, intraretinal hemorrhages and MAs superior macula -- improved, persistent edema/cystic changes, pigment clumping nasal to fovea, mild cluster of exudates ST to fovea Flat, few nasal MAs   Vessels Tortuous Mild tortuosity    Periphery Attached, rare dot hemorrhages, punctate pigmented lesion at 1200 Attached          IMAGING AND PROCEDURES  Imaging and Procedures for 07/04/17  OCT, Retina - OU - Both Eyes       Right Eye Quality was good. Central Foveal Thickness: 248. Progression has improved. Findings include  intraretinal fluid, no SRF, abnormal foveal contour (improvement in IRF, no SRF).   Left Eye Quality was good. Central Foveal Thickness: 261. Progression has been stable. Findings include normal foveal contour, no IRF, no SRF, vitreomacular adhesion .   Notes Images taken, stored on drive  Diagnosis / Impression:  OD: BRVO with stable to slight improvement of IRF OS: VMA, No IRF, No SRF  Clinical management:  See below  Abbreviations: NFP - Normal foveal profile. CME - cystoid macular edema. PED - pigment epithelial detachment. IRF - intraretinal fluid. SRF - subretinal fluid. EZ - ellipsoid zone. ERM - epiretinal membrane. ORA - outer retinal atrophy. ORT - outer retinal tubulation. SRHM - subretinal hyper-reflective material         Intravitreal Injection, Pharmacologic Agent - OD - Right Eye       Time Out 09/11/2017. 2:42 PM. Confirmed correct patient, procedure, site, and patient consented.   Anesthesia Topical anesthesia was used. Anesthetic medications included Tetracaine 0.5%, Lidocaine 2%.   Procedure Preparation included 5% betadine to ocular surface, eyelid speculum. A supplied needle was used.   Injection: 2 mg aflibercept 2 MG/0.05ML   NDC: 65993-570-17    Lot: [id number]    Expiration Date: 05/19/2018   Route: Intravitreal   Site: Right Eye   Waste: 0.5 mg  Post-op Post injection exam found visual acuity of at least counting fingers. The patient tolerated the procedure well. There were no complications. The patient received written and verbal post procedure care education.                 ASSESSMENT/PLAN:    ICD-10-CM   1. Branch retinal vein occlusion of right eye with macular edema H34.8310 OCT, Retina - OU - Both Eyes    Intravitreal Injection, Pharmacologic Agent - OD - Right Eye    aflibercept (EYLEA) SOLN 2 mg  2. Vitreomacular adhesion of left eye H43.822   3. Hypertensive retinopathy of both eyes H35.033   4. Pseudophakia of both eyes  Z96.1   5. PCO (posterior capsular opacification), bilateral H26.493   6. Stevens-Johnson syndrome (HCC) L51.1   7. Retinal edema H35.81 OCT, Retina - OU - Both Eyes    1. BRVO with ME OD-  - moved here from [place] in July 2018, was receiving unknown injections, treat and extend, OD - last TX injection OD in June 2018 - initially presented with subjective worsening of vision OD since July - s/p IVA OD #1 (11.13.18), #2 (12.12.18), #3 (01.15.19), #4 (02.13.19), #5 (03.18.19) - pt approved for Eylea - s/p IVE OD #1 (04.16.19), #2 (05.21.19) - today, OCT stable to slightly improved IRF at 5 week interval - BCVA remains 20/40 OD today - recommend IVE OD #3 today (06.24.19) - RBA of procedure discussed, questions answered - informed consent obtained and signed - see procedure note - F/U 4-5 weeks -- DFE/OCT/possible IVE  2. VMA OS - mild without traction - stable - monitor  3. Hypertensive Retinopathy OU-  - stable - discussed importance of tight BP control - monitor  4. Pseudophakia OU-  - s/p CE/IOL OU - beautiful surgery, doing well - monitor  5. PCO OU-  - S/P YAG Cap procedure OD (01.24.19) - notably irregular epi and significant PEE inferiorly disrupting laser during procedure - today, very minimal residual PCO inferiorly outside of visual axis -- recommend monitoring for now  6. SJS w/ mild DES OU - continue using artificial tears and lubricating ointment as needed  Pt requests referral to plastic surgeon for lid eval - will try to get in touch with Dr. Toy Cookey   Ophthalmic Meds Ordered this visit:  Meds ordered this encounter  Medications  . aflibercept (EYLEA) SOLN 2 mg       Return in about 5 weeks (around 10/16/2017) for F/U BRVO OD, DFE, OCT.  There are no Patient Instructions on file for this visit.   Explained  the diagnoses, plan, and follow up with the patient and they expressed understanding.  Patient expressed understanding of the importance of  proper follow up care.   This document serves as a record of services personally performed by Gardiner Sleeper, MD, PhD. It was created on their behalf by Catha Brow, [place], a certified ophthalmic assistant. The creation of this record is the provider's dictation and/or activities during the visit.  Electronically signed by: Catha Brow, [place]  06.20.19 12:44 AM   Gardiner Sleeper, M.D., Ph.D. Diseases & Surgery of the Retina and Vitreous [facility]  I have reviewed the above documentation for accuracy and completeness, and I agree with the above. Gardiner Sleeper, M.D., Ph.D. 09/13/17 12:46 AM    Abbreviations: M myopia (nearsighted); A astigmatism; H hyperopia (farsighted); P presbyopia; Mrx spectacle prescription;  CTL contact lenses; OD right eye; OS left eye; OU both eyes  XT exotropia; ET esotropia; PEK punctate epithelial keratitis; PEE punctate epithelial erosions; DES dry eye syndrome; MGD meibomian gland dysfunction; ATs artificial tears; PFAT's preservative free artificial tears; [place] nuclear sclerotic cataract; PSC posterior subcapsular cataract; ERM epi-retinal membrane; PVD posterior vitreous detachment; RD retinal detachment; DM diabetes mellitus; DR diabetic retinopathy; NPDR non-proliferative diabetic retinopathy; PDR proliferative diabetic retinopathy; CSME clinically significant macular edema; DME diabetic macular edema; dbh dot blot hemorrhages; CWS cotton wool spot; POAG primary open angle glaucoma; C/D cup-to-disc ratio; HVF humphrey visual field; GVF goldmann visual field; OCT optical coherence tomography; IOP intraocular pressure; BRVO Branch retinal vein occlusion; CRVO central retinal vein occlusion; CRAO central retinal artery occlusion; BRAO branch retinal artery occlusion; RT retinal tear; SB scleral buckle; PPV pars plana vitrectomy; VH Vitreous hemorrhage; PRP panretinal laser photocoagulation; IVK intravitreal kenalog; VMT vitreomacular  traction; MH Macular hole;  NVD neovascularization of the disc; NVE neovascularization elsewhere; AREDS age related eye disease study; ARMD age related macular degeneration; POAG primary open angle glaucoma; EBMD epithelial/anterior basement membrane dystrophy; ACIOL anterior chamber intraocular lens; IOL intraocular lens; PCIOL posterior chamber intraocular lens; Phaco/IOL phacoemulsification with intraocular lens placement; [place] photorefractive keratectomy; LASIK laser assisted in situ keratomileusis; HTN hypertension; DM diabetes mellitus; COPD chronic obstructive pulmonary disease

## 2017-09-11 ENCOUNTER — Ambulatory Visit (INDEPENDENT_AMBULATORY_CARE_PROVIDER_SITE_OTHER): Payer: Medicare HMO | Admitting: Ophthalmology

## 2017-09-11 ENCOUNTER — Encounter (INDEPENDENT_AMBULATORY_CARE_PROVIDER_SITE_OTHER): Payer: Self-pay | Admitting: Ophthalmology

## 2017-09-11 DIAGNOSIS — H3581 Retinal edema: Secondary | ICD-10-CM | POA: Diagnosis not present

## 2017-09-11 DIAGNOSIS — L511 Stevens-Johnson syndrome: Secondary | ICD-10-CM

## 2017-09-11 DIAGNOSIS — H43822 Vitreomacular adhesion, left eye: Secondary | ICD-10-CM

## 2017-09-11 DIAGNOSIS — Z961 Presence of intraocular lens: Secondary | ICD-10-CM

## 2017-09-11 DIAGNOSIS — H34831 Tributary (branch) retinal vein occlusion, right eye, with macular edema: Secondary | ICD-10-CM | POA: Diagnosis not present

## 2017-09-11 DIAGNOSIS — H26493 Other secondary cataract, bilateral: Secondary | ICD-10-CM

## 2017-09-11 DIAGNOSIS — H35033 Hypertensive retinopathy, bilateral: Secondary | ICD-10-CM

## 2017-09-12 DIAGNOSIS — H3581 Retinal edema: Secondary | ICD-10-CM | POA: Diagnosis not present

## 2017-09-12 DIAGNOSIS — H34831 Tributary (branch) retinal vein occlusion, right eye, with macular edema: Secondary | ICD-10-CM | POA: Diagnosis not present

## 2017-09-12 MED ORDER — AFLIBERCEPT 2MG/0.05ML IZ SOLN FOR KALEIDOSCOPE
2.0000 mg | INTRAVITREAL | Status: AC
  Administered 2017-09-12: 2 mg via INTRAVITREAL

## 2017-09-13 ENCOUNTER — Encounter (INDEPENDENT_AMBULATORY_CARE_PROVIDER_SITE_OTHER): Payer: Self-pay | Admitting: Ophthalmology

## 2017-10-02 DIAGNOSIS — Z789 Other specified health status: Secondary | ICD-10-CM | POA: Diagnosis not present

## 2017-10-02 DIAGNOSIS — L989 Disorder of the skin and subcutaneous tissue, unspecified: Secondary | ICD-10-CM | POA: Diagnosis not present

## 2017-10-02 DIAGNOSIS — M02879 Other reactive arthropathies, unspecified ankle and foot: Secondary | ICD-10-CM | POA: Diagnosis not present

## 2017-10-02 DIAGNOSIS — L309 Dermatitis, unspecified: Secondary | ICD-10-CM | POA: Diagnosis not present

## 2017-10-02 DIAGNOSIS — Z808 Family history of malignant neoplasm of other organs or systems: Secondary | ICD-10-CM | POA: Diagnosis not present

## 2017-10-06 NOTE — Progress Notes (Addendum)
[facility] Note  10/09/2017     CHIEF COMPLAINT Patient presents for Retina Follow Up   HISTORY OF PRESENT ILLNESS: Natalie Johnson is a 72 y.o. female who presents to the clinic today for:   HPI    Retina Follow Up    Patient presents with  CRVO/BRVO.  In right eye.  Severity is moderate.  Duration of 1 month.  Since onset it is stable.  I, the attending physician,  performed the HPI with the patient and updated documentation appropriately.          Comments    Pt presents for BRVO f/u, pt states VA has been okay, states she has a lot of floaters OD, pt also states that first thing in the morning after she opens the curtain she has a redness OD that appears the same as when she first found out she had a bleed in her eye, pt denies FOL, pain and wavy vision,        Last edited by [name], MD on 10/09/2017  1:53 PM. (History)    Pt states OU VA is stable; Pt states when she wakes up in the morning she opens the blinds and notices a "red splash" in [place], states it lasts about 5-10 seconds;   Referring physician: [name], MD [address] [place], [place] [address]  HISTORICAL INFORMATION:   Selected notes from the MEDICAL RECORD NUMBER Referral from Dr. Read Drivers for concern of BRVO OD;  LEE- 11.8.18 (Dr. Read Drivers) Ocular Hx- pseudophakia OU (2015, in [place], Surgeon: unknown), Hx of injections for 'retinal bleeding' [last injection around June 2018], SJS, HTN ret OU;  PMH- SJS, thyroid disease, high chol., fibromyalgia;    CURRENT MEDICATIONS: No current outpatient medications on file. (Ophthalmic Drugs)   Current Facility-Administered Medications (Ophthalmic Drugs)  Medication Route  . aflibercept (EYLEA) SOLN 2 mg Intravitreal  . aflibercept (EYLEA) SOLN 2 mg Intravitreal  . aflibercept (EYLEA) SOLN 2 mg Intravitreal  . aflibercept (EYLEA) SOLN 2 mg Intravitreal   Current Outpatient Medications (Other)  Medication Sig  .  amitriptyline (ELAVIL) 100 MG tablet   . clonazePAM (KLONOPIN) 2 MG tablet   . doxycycline (VIBRA-TABS) 100 MG tablet Take 100 mg by mouth 2 (two) times daily. for 7 days  . DULoxetine (CYMBALTA) 30 MG capsule   . fludrocortisone (FLORINEF) 0.1 MG tablet Take 0.1 mg daily by mouth.  . levothyroxine (SYNTHROID, LEVOTHROID) 100 MCG tablet   . lovastatin (MEVACOR) 40 MG tablet   . omeprazole (PRILOSEC) 40 MG capsule   . potassium chloride (MICRO-K) 10 MEQ CR capsule   . rizatriptan (MAXALT) 10 MG tablet   . triamcinolone cream (KENALOG) 0.1 % APPLY CREAM EXTERNALLY TO AFFECTED AREA THREE TIMES DAILY AS NEEDED FOR DERMATITIS   Current Facility-Administered Medications (Other)  Medication Route  . Bevacizumab (AVASTIN) SOLN 1.25 mg Intravitreal  . Bevacizumab (AVASTIN) SOLN 1.25 mg Intravitreal  . Bevacizumab (AVASTIN) SOLN 1.25 mg Intravitreal  . Bevacizumab (AVASTIN) SOLN 1.25 mg Intravitreal  . Bevacizumab (AVASTIN) SOLN 1.25 mg Intravitreal      REVIEW OF SYSTEMS: ROS    Positive for: Eyes   Negative for: Constitutional, Gastrointestinal, Neurological, Skin, Genitourinary, Musculoskeletal, HENT, Endocrine, Cardiovascular, Respiratory, Psychiatric, Allergic/Imm, Heme/Lymph   Last edited by Debbrah Alar, COT on 10/09/2017  1:39 PM. (History)       ALLERGIES Allergies  Allergen Reactions  . Augmentin [Amoxicillin-Pot Clavulanate] Anaphylaxis  . Trihexyphenidyl Hcl Anaphylaxis  .  Vancomycin Anaphylaxis  . Amoxicillin Hives  . Penicillins Hives  . Piperacillin Hives  . Potassium-Containing Compounds   . Sodium Acetylsalicylate [Aspirin] Hives  . Soma [Carisoprodol] Hives  . Linezolid Rash    PAST MEDICAL HISTORY Past Medical History:  Diagnosis Date  . Ankle fracture   . Neuropathy   . Stevens-Johnson disease [facility])    Past Surgical History:  Procedure Laterality Date  . ABDOMINAL HYSTERECTOMY    . CATARACT EXTRACTION  2014   OU  . ELBOW FRACTURE SURGERY    .  GALLBLADDER SURGERY    . KNEE ARTHROSCOPY    . LUNG LOBECTOMY    . SHOULDER SURGERY    . TONSILLECTOMY      FAMILY HISTORY Family History  Problem Relation Age of Onset  . Macular degeneration Mother   . Glaucoma Father     SOCIAL HISTORY Social History   Tobacco Use  . Smoking status: Never Smoker  . Smokeless tobacco: Never Used  Substance Use Topics  . Alcohol use: No    Frequency: Never  . Drug use: No         OPHTHALMIC EXAM:  Base Eye Exam    Visual Acuity (Snellen - Linear)      Right Left   Dist [place] 20/50 20/25 +1   Dist ph [place] 20/30 +2        Tonometry (Tonopen, 1:45 PM)      Right Left   Pressure 14 17       Pupils      Dark Light Shape React APD   Right 6 4 Round Brisk None   Left 6 4 Round Brisk None       Visual Fields      Left Right    Full Full       Neuro/Psych    Oriented x3:  Yes   Mood/Affect:  Normal       Dilation    Both eyes:  1.0% Mydriacyl, 2.5% Phenylephrine @ 1:45 PM        Slit Lamp and Fundus Exam    External Exam      Right Left   External Normal Normal       Slit Lamp Exam      Right Left   Lids/Lashes Dermatochalasis - upper lid Dermatochalasis - upper lid   Conjunctiva/Sclera White and quiet White and quiet   Cornea Arcus, 3+ central Punctate epithelial erosions Arcus, 1+ Punctate epithelial erosions   Anterior Chamber Deep and quiet Deep and quiet   Iris Round and dilated to 59m; no NVI Round and dilated to 857m  Lens Posterior chamber intraocular lens, Open PC, inferior boarder of PC is 85m57mbove lens edge Posterior chamber intraocular lens, 1+ diffuse Posterior capsular opacification greatest peripherally, clearer centrally   Vitreous Vitreous syneresis, Posterior vitreous detachment, mild Asteroid hyalosis Vitreous syneresis       Fundus Exam      Right Left   Disc Normal, vascular loops Normal   C/D Ratio 0.4 0.45   Macula Blunted foveal reflex, intraretinal hemorrhages and MAs superior macula --  improved, persistent edema/cystic changes, pigment clumping nasal to fovea, mild cluster of exudates ST to fovea Flat, few nasal MAs   Vessels Tortuous, Vascular attenuation Mild tortuosity    Periphery Attached, rare dot hemorrhages, punctate pigmented lesion at 1200 Attached          IMAGING AND PROCEDURES  Imaging and Procedures for 07/04/17  OCT, Retina -  OU - Both Eyes       Right Eye Quality was good. Central Foveal Thickness: 248. Progression has been stable. Findings include intraretinal fluid, no SRF, abnormal foveal contour (Stable, persistent IRF, no SRF).   Left Eye Quality was good. Central Foveal Thickness: 259. Progression has been stable. Findings include normal foveal contour, no IRF, no SRF, vitreomacular adhesion .   Notes Images taken, stored on drive  Diagnosis / Impression:  OD: BRVO with stable, persistent IRF OS: VMA, No IRF, No SRF  Clinical management:  See below  Abbreviations: NFP - Normal foveal profile. CME - cystoid macular edema. PED - pigment epithelial detachment. IRF - intraretinal fluid. SRF - subretinal fluid. EZ - ellipsoid zone. ERM - epiretinal membrane. ORA - outer retinal atrophy. ORT - outer retinal tubulation. SRHM - subretinal hyper-reflective material         Intravitreal Injection, Pharmacologic Agent - OD - Right Eye       Time Out 10/09/2017. 2:17 PM. Confirmed correct patient, procedure, site, and patient consented.   Anesthesia Topical anesthesia was used. Anesthetic medications included Lidocaine 2%, Tetracaine 0.5%.   Procedure Preparation included eyelid speculum, 5% betadine to ocular surface. A 30 gauge needle was used.   Injection: 2 mg aflibercept 2 MG/0.05ML   NDC: 72820-601-56    Lot: [id number]    Expiration Date: 06/17/2018   Route: Intravitreal   Site: Right Eye   Waste: 0.05 mL  Post-op Post injection exam found visual acuity of at least counting fingers. The patient tolerated the procedure well.  There were no complications. The patient received written and verbal post procedure care education.                 ASSESSMENT/PLAN:    ICD-10-CM   1. Branch retinal vein occlusion of right eye with macular edema H34.8310 Intravitreal Injection, Pharmacologic Agent - OD - Right Eye    aflibercept (EYLEA) SOLN 2 mg  2. Vitreomacular adhesion of left eye H43.822   3. Hypertensive retinopathy of both eyes H35.033   4. Pseudophakia of both eyes Z96.1   5. PCO (posterior capsular opacification), bilateral H26.493   6. Stevens-Johnson syndrome (HCC) L51.1   7. Retinal edema H35.81 OCT, Retina - OU - Both Eyes    1. BRVO with ME OD-  - moved here from [place] in July 2018, was receiving unknown injections, treat and extend, OD - last [place] injection OD in June 2018 - initially presented with subjective worsening of vision OD since July - s/p IVA OD #1 (11.13.18), #2 (12.12.18), #3 (01.15.19), #4 (02.13.19), #5 (03.18.19) - pt approved for Eylea - s/p IVE OD #1 (04.16.19), #2 (05.21.19), #3 (06.24.19) - today, OCT remains stable at 4 week interval -- persistent cystic changes - BCVA improved to 20/30+2 OD today - recommend IVE OD #4 today (07.22.19) - RBA of procedure discussed, questions answered - informed consent obtained and signed - see procedure note - F/U 4 weeks, DFE, OCT, FA (Optos, transit OD), possible focal laser  2. VMA OS - mild without traction - stable - monitor  3. Hypertensive Retinopathy OU-  - stable - discussed importance of tight BP control - monitor  4. Pseudophakia OU-  - s/p CE/IOL OU - beautiful surgery, doing well - monitor  5. PCO OU-  - S/P YAG Cap procedure OD (01.24.19) - doing well  6. SJS w/ mild DES OU - continue using artificial tears and lubricating ointment as needed  Pt requests referral to  plastic surgeon for lid eval - will try to get in touch with Dr. Toy Cookey   Ophthalmic Meds Ordered this visit:  Meds ordered this encounter   Medications  . aflibercept (EYLEA) SOLN 2 mg       Return in about 1 month (around 11/06/2017) for F/U BRVO OD, DFE, OCT, FA.  There are no Patient Instructions on file for this visit.   Explained the diagnoses, plan, and follow up with the patient and they expressed understanding.  Patient expressed understanding of the importance of proper follow up care.   This document serves as a record of services personally performed by Gardiner Sleeper, MD, PhD. It was created on their behalf by Ernest Mallick, OA, an ophthalmic assistant. The creation of this record is the provider's dictation and/or activities during the visit.    Electronically signed by: Ernest Mallick, OA  07.19.2019 2:38 PM    Gardiner Sleeper, M.D., Ph.D. Diseases & Surgery of the Retina and [place] 10/09/17  I have reviewed the above documentation for accuracy and completeness, and I agree with the above. Gardiner Sleeper, M.D., Ph.D. 10/09/17 2:38 PM   Abbreviations: M myopia (nearsighted); A astigmatism; H hyperopia (farsighted); P presbyopia; Mrx spectacle prescription;  CTL contact lenses; OD right eye; OS left eye; OU both eyes  XT exotropia; ET esotropia; PEK punctate epithelial keratitis; PEE punctate epithelial erosions; DES dry eye syndrome; MGD meibomian gland dysfunction; ATs artificial tears; PFAT's preservative free artificial tears; [place] nuclear sclerotic cataract; PSC posterior subcapsular cataract; ERM epi-retinal membrane; PVD posterior vitreous detachment; RD retinal detachment; DM diabetes mellitus; DR diabetic retinopathy; NPDR non-proliferative diabetic retinopathy; PDR proliferative diabetic retinopathy; CSME clinically significant macular edema; DME diabetic macular edema; dbh dot blot hemorrhages; CWS cotton wool spot; POAG primary open angle glaucoma; C/D cup-to-disc ratio; HVF humphrey visual field; GVF goldmann visual field; OCT optical coherence tomography; IOP  intraocular pressure; BRVO Branch retinal vein occlusion; CRVO central retinal vein occlusion; CRAO central retinal artery occlusion; BRAO branch retinal artery occlusion; RT retinal tear; SB scleral buckle; PPV pars plana vitrectomy; VH Vitreous hemorrhage; PRP panretinal laser photocoagulation; IVK intravitreal kenalog; VMT vitreomacular traction; MH Macular hole;  NVD neovascularization of the disc; NVE neovascularization elsewhere; AREDS age related eye disease study; ARMD age related macular degeneration; POAG primary open angle glaucoma; EBMD epithelial/anterior basement membrane dystrophy; ACIOL anterior chamber intraocular lens; IOL intraocular lens; PCIOL posterior chamber intraocular lens; Phaco/IOL phacoemulsification with intraocular lens placement; [place] photorefractive keratectomy; LASIK laser assisted in situ keratomileusis; HTN hypertension; DM diabetes mellitus; COPD chronic obstructive pulmonary disease

## 2017-10-09 ENCOUNTER — Ambulatory Visit (INDEPENDENT_AMBULATORY_CARE_PROVIDER_SITE_OTHER): Payer: Medicare HMO | Admitting: Ophthalmology

## 2017-10-09 ENCOUNTER — Encounter (INDEPENDENT_AMBULATORY_CARE_PROVIDER_SITE_OTHER): Payer: Self-pay | Admitting: Ophthalmology

## 2017-10-09 DIAGNOSIS — H34831 Tributary (branch) retinal vein occlusion, right eye, with macular edema: Secondary | ICD-10-CM

## 2017-10-09 DIAGNOSIS — H26493 Other secondary cataract, bilateral: Secondary | ICD-10-CM

## 2017-10-09 DIAGNOSIS — H43822 Vitreomacular adhesion, left eye: Secondary | ICD-10-CM

## 2017-10-09 DIAGNOSIS — H3581 Retinal edema: Secondary | ICD-10-CM

## 2017-10-09 DIAGNOSIS — H35033 Hypertensive retinopathy, bilateral: Secondary | ICD-10-CM

## 2017-10-09 DIAGNOSIS — L511 Stevens-Johnson syndrome: Secondary | ICD-10-CM

## 2017-10-09 DIAGNOSIS — Z961 Presence of intraocular lens: Secondary | ICD-10-CM

## 2017-10-09 MED ORDER — AFLIBERCEPT 2MG/0.05ML IZ SOLN FOR KALEIDOSCOPE
2.0000 mg | INTRAVITREAL | Status: AC
  Administered 2017-10-09: 2 mg via INTRAVITREAL

## 2017-11-03 NOTE — Progress Notes (Addendum)
[facility] Note  11/06/2017     CHIEF COMPLAINT Patient presents for Retina Follow Up   HISTORY OF PRESENT ILLNESS: Natalie Johnson is a 72 y.o. female who presents to the clinic today for:   HPI    Retina Follow Up    Patient presents with  CRVO/BRVO.  In right eye.  This started 2 months ago.  Severity is mild.  Since onset it is stable.  I, the attending physician,  performed the HPI with the patient and updated documentation appropriately.          Comments    F/U BRVO OD . Patient states her vision is the same since last OV, pt still has" red splashes" in her vision in am that last 1-2 seconds. Pt is ready for tx if indicted today.       Last edited by [name], MD on 11/06/2017  3:49 PM. (History)    Pt states OU VA is stable; Pt states when she wakes up in the morning she opens the blinds and notices a "red splash" in [place], states it lasts about 5-10 seconds;   Referring physician: [name], MD [address] [place], [place] [address]  HISTORICAL INFORMATION:   Selected notes from the MEDICAL RECORD NUMBER Referral from Dr. Read Drivers for concern of BRVO OD;  LEE- 11.8.18 (Dr. Read Drivers) Ocular Hx- pseudophakia OU (2015, in [place], Surgeon: unknown), Hx of injections for 'retinal bleeding' [last injection around June 2018], SJS, HTN ret OU;  PMH- SJS, thyroid disease, high chol., fibromyalgia;    CURRENT MEDICATIONS: No current outpatient medications on file. (Ophthalmic Drugs)   Current Facility-Administered Medications (Ophthalmic Drugs)  Medication Route  . aflibercept (EYLEA) SOLN 2 mg Intravitreal  . aflibercept (EYLEA) SOLN 2 mg Intravitreal  . aflibercept (EYLEA) SOLN 2 mg Intravitreal  . aflibercept (EYLEA) SOLN 2 mg Intravitreal  . aflibercept (EYLEA) SOLN 2 mg Intravitreal   Current Outpatient Medications (Other)  Medication Sig  . amitriptyline (ELAVIL) 100 MG tablet   . clonazePAM (KLONOPIN) 2 MG tablet   .  doxycycline (VIBRA-TABS) 100 MG tablet Take 100 mg by mouth 2 (two) times daily. for 7 days  . DULoxetine (CYMBALTA) 30 MG capsule   . fludrocortisone (FLORINEF) 0.1 MG tablet Take 0.1 mg daily by mouth.  . levothyroxine (SYNTHROID, LEVOTHROID) 100 MCG tablet   . lovastatin (MEVACOR) 40 MG tablet   . omeprazole (PRILOSEC) 40 MG capsule   . potassium chloride (MICRO-K) 10 MEQ CR capsule   . rizatriptan (MAXALT) 10 MG tablet   . triamcinolone cream (KENALOG) 0.1 % APPLY CREAM EXTERNALLY TO AFFECTED AREA THREE TIMES DAILY AS NEEDED FOR DERMATITIS   Current Facility-Administered Medications (Other)  Medication Route  . Bevacizumab (AVASTIN) SOLN 1.25 mg Intravitreal  . Bevacizumab (AVASTIN) SOLN 1.25 mg Intravitreal  . Bevacizumab (AVASTIN) SOLN 1.25 mg Intravitreal  . Bevacizumab (AVASTIN) SOLN 1.25 mg Intravitreal  . Bevacizumab (AVASTIN) SOLN 1.25 mg Intravitreal      REVIEW OF SYSTEMS: ROS    Positive for: Eyes   Negative for: Constitutional, Gastrointestinal, Neurological, Skin, Genitourinary, Musculoskeletal, HENT, Endocrine, Cardiovascular, Respiratory, Psychiatric, Allergic/Imm, Heme/Lymph   Last edited by Zenovia Jordan, LPN on 08/23/5407  8:11 PM. (History)       ALLERGIES Allergies  Allergen Reactions  . Augmentin [Amoxicillin-Pot Clavulanate] Anaphylaxis  . Trihexyphenidyl Hcl Anaphylaxis  . Vancomycin Anaphylaxis  . Amoxicillin Hives  . Penicillins Hives  . Piperacillin Hives  . Potassium-Containing Compounds   .  Sodium Acetylsalicylate [Aspirin] Hives  . Soma [Carisoprodol] Hives  . Linezolid Rash    PAST MEDICAL HISTORY Past Medical History:  Diagnosis Date  . Ankle fracture   . Neuropathy   . Stevens-Johnson disease [facility])    Past Surgical History:  Procedure Laterality Date  . ABDOMINAL HYSTERECTOMY    . CATARACT EXTRACTION  2014   OU  . ELBOW FRACTURE SURGERY    . GALLBLADDER SURGERY    . KNEE ARTHROSCOPY    . LUNG LOBECTOMY    . SHOULDER  SURGERY    . TONSILLECTOMY      FAMILY HISTORY Family History  Problem Relation Age of Onset  . Macular degeneration Mother   . Glaucoma Father     SOCIAL HISTORY Social History   Tobacco Use  . Smoking status: Never Smoker  . Smokeless tobacco: Never Used  Substance Use Topics  . Alcohol use: No    Frequency: Never  . Drug use: No         OPHTHALMIC EXAM:  Base Eye Exam    Visual Acuity (Snellen - Linear)      Right Left   Dist [place] 20/50 20/25   Dist ph Protection 20/30 NI       Tonometry (Tonopen, 1:32 PM)      Right Left   Pressure 15 14       Pupils      Dark Light Shape React APD   Right 5 4 Round Brisk None   Left 5 4 Round Brisk None       Visual Fields (Counting fingers)      Left Right    Full Full       Extraocular Movement      Right Left    Full, Ortho Full, Ortho       Neuro/Psych    Oriented x3:  Yes   Mood/Affect:  Normal       Dilation    Both eyes:  1.0% Mydriacyl, 2.5% Phenylephrine @ 1:33 PM        Slit Lamp and Fundus Exam    External Exam      Right Left   External Normal Normal       Slit Lamp Exam      Right Left   Lids/Lashes Dermatochalasis - upper lid Dermatochalasis - upper lid   Conjunctiva/Sclera White and quiet White and quiet   Cornea Arcus, 3+ central Punctate epithelial erosions Arcus, 1+ Punctate epithelial erosions   Anterior Chamber Deep and quiet Deep and quiet   Iris Round and dilated to 592m; no NVI Round and dilated to 823m  Lens Posterior chamber intraocular lens, Open PC, inferior boarder of PC is [id number] lens edge Posterior chamber intraocular lens, 1+ diffuse Posterior capsular opacification greatest peripherally, clearer centrally   Vitreous Vitreous syneresis, Posterior vitreous detachment, mild Asteroid hyalosis Vitreous syneresis       Fundus Exam      Right Left   Disc Normal, vascular loops Normal   C/D Ratio 0.4 0.45   Macula Blunted foveal reflex, intraretinal hemorrhages and MAs  superior macula -- resolved, persistent edema/cystic changes, pigment clumping nasal to fovea, mild cluster of exudates ST to fovea -- improved Flat, few nasal MAs   Vessels Tortuous, Vascular attenuation Mild tortuosity    Periphery Attached, rare dot hemorrhages, punctate pigmented lesion at 1200 Attached          IMAGING AND PROCEDURES  Imaging and Procedures for 07/04/17  OCT, Retina - OU - Both Eyes       Right Eye Quality was good. Central Foveal Thickness: 251. Progression has been stable. Findings include intraretinal fluid, no SRF, abnormal foveal contour (Persistent IRF, no SRF).   Left Eye Quality was good. Central Foveal Thickness: 261. Progression has been stable. Findings include normal foveal contour, no IRF, no SRF, vitreomacular adhesion .   Notes Images taken, stored on drive  Diagnosis / Impression:  OD: BRVO with stable, persistent IRF OS: VMA, No IRF, No SRF  Clinical management:  See below  Abbreviations: NFP - Normal foveal profile. CME - cystoid macular edema. PED - pigment epithelial detachment. IRF - intraretinal fluid. SRF - subretinal fluid. EZ - ellipsoid zone. ERM - epiretinal membrane. ORA - outer retinal atrophy. ORT - outer retinal tubulation. SRHM - subretinal hyper-reflective material         Fluorescein Angiography Optos (Transit OS)       Right Eye   Progression has no prior data. Early phase findings include microaneurysm. Mid/Late phase findings include microaneurysm.   Left Eye   Progression has no prior data. Early phase findings include normal observations. Mid/Late phase findings include normal observations.   Notes *Images captured and stored on drive  Transit was actually OD  Impression: OD: few mild Mas sup macula -- minimal leakage OS: Normal study         Intravitreal Injection, Pharmacologic Agent - OD - Right Eye       Time Out 11/06/2017. 2:45 PM. Confirmed correct patient, procedure, site, and  patient consented.   Anesthesia Topical anesthesia was used. Anesthetic medications included Lidocaine 2%, Tetracaine 0.5%.   Procedure Preparation included 5% betadine to ocular surface, eyelid speculum. A 30 gauge needle was used.   Injection:  2 mg aflibercept 2 MG/0.05ML   NDC: 61755-005-02, Lot: [id number], Expiration date: 09/18/2018   Route: Intravitreal, Site: Right Eye, Waste: 0.05 mL  Post-op Post injection exam found visual acuity of at least counting fingers. The patient tolerated the procedure well. There were no complications. The patient received written and verbal post procedure care education.                 ASSESSMENT/PLAN:    ICD-10-CM   1. Branch retinal vein occlusion of right eye with macular edema H34.8310 OCT, Retina - OU - Both Eyes    Fluorescein Angiography Optos (Transit OS)    Intravitreal Injection, Pharmacologic Agent - OD - Right Eye    aflibercept (EYLEA) SOLN 2 mg  2. Vitreomacular adhesion of left eye H43.822   3. Hypertensive retinopathy of both eyes H35.033 Fluorescein Angiography Optos (Transit OS)  4. Pseudophakia of both eyes Z96.1   5. PCO (posterior capsular opacification), bilateral H26.493   6. Stevens-Johnson syndrome (HCC) L51.1   7. Retinal edema H35.81 OCT, Retina - OU - Both Eyes    1. BRVO with ME OD-  - moved here from [place] in July 2018, was receiving unknown injections, treat and extend, OD - last TX injection OD in June 2018 - initially presented with subjective worsening of vision OD since July - s/p IVA OD #1 (11.13.18), #2 (12.12.18), #3 (01.15.19), #4 (02.13.19), #5 (03.18.19) - pt approved for Eylea - s/p IVE OD #1 (04.16.19), #2 (05.21.19), #3 (06.24.19), #4 (07.22.19) - today, OCT remains stable at 4 week interval -- persistent cystic changes - FA without significant leakage or targets for focal laser - BCVA also stable at 20/30 OD today - discussed  possibility that this may be new baseline - recommend IVE  OD #5 today (08.19.19) with extension to 6 wks - RBA of procedure discussed, questions answered - informed consent obtained and signed - see procedure note - F/U 6 weeks, DFE, OCT  2. VMA OS - mild without traction - stable - monitor  3. Hypertensive Retinopathy OU-  - stable - discussed importance of tight BP control - monitor  4. Pseudophakia OU-  - s/p CE/IOL OU - beautiful surgery, doing well - monitor  5. PCO OU-  - S/P YAG Cap procedure OD (01.24.19) - doing well  6. SJS w/ mild DES OU - continue using artificial tears and lubricating ointment as needed    Ophthalmic Meds Ordered this visit:  Meds ordered this encounter  Medications  . aflibercept (EYLEA) SOLN 2 mg       Return in about 6 weeks (around 12/18/2017) for F/U BRVO OD, DFE, OCT.  There are no Patient Instructions on file for this visit.   Explained the diagnoses, plan, and follow up with the patient and they expressed understanding.  Patient expressed understanding of the importance of proper follow up care.   This document serves as a record of services personally performed by Gardiner Sleeper, MD, PhD. It was created on their behalf by Ernest Mallick, OA, an ophthalmic assistant. The creation of this record is the provider's dictation and/or activities during the visit.    Electronically signed by: Ernest Mallick, OA  08.16.2019 4:01 PM   This document serves as a record of services personally performed by Gardiner Sleeper, MD, PhD. It was created on their behalf by Catha Brow, [place], a certified ophthalmic assistant. The creation of this record is the provider's dictation and/or activities during the visit.  Electronically signed by: Catha Brow, [place]  08.19.19 4:01 PM   Gardiner Sleeper, M.D., Ph.D. Diseases & Surgery of the Retina and Vitreous [facility]  I have reviewed the above documentation for accuracy and completeness, and I agree with the above. Gardiner Sleeper, M.D., Ph.D. 11/06/17 4:01 PM     Abbreviations: M myopia (nearsighted); A astigmatism; H hyperopia (farsighted); P presbyopia; Mrx spectacle prescription;  CTL contact lenses; OD right eye; OS left eye; OU both eyes  XT exotropia; ET esotropia; PEK punctate epithelial keratitis; PEE punctate epithelial erosions; DES dry eye syndrome; MGD meibomian gland dysfunction; ATs artificial tears; PFAT's preservative free artificial tears; [place] nuclear sclerotic cataract; PSC posterior subcapsular cataract; ERM epi-retinal membrane; PVD posterior vitreous detachment; RD retinal detachment; DM diabetes mellitus; DR diabetic retinopathy; NPDR non-proliferative diabetic retinopathy; PDR proliferative diabetic retinopathy; CSME clinically significant macular edema; DME diabetic macular edema; dbh dot blot hemorrhages; CWS cotton wool spot; POAG primary open angle glaucoma; C/D cup-to-disc ratio; HVF humphrey visual field; GVF goldmann visual field; OCT optical coherence tomography; IOP intraocular pressure; BRVO Branch retinal vein occlusion; CRVO central retinal vein occlusion; CRAO central retinal artery occlusion; BRAO branch retinal artery occlusion; RT retinal tear; SB scleral buckle; PPV pars plana vitrectomy; VH Vitreous hemorrhage; PRP panretinal laser photocoagulation; IVK intravitreal kenalog; VMT vitreomacular traction; MH Macular hole;  NVD neovascularization of the disc; NVE neovascularization elsewhere; AREDS age related eye disease study; ARMD age related macular degeneration; POAG primary open angle glaucoma; EBMD epithelial/anterior basement membrane dystrophy; ACIOL anterior chamber intraocular lens; IOL intraocular lens; PCIOL posterior chamber intraocular lens; Phaco/IOL phacoemulsification with intraocular lens placement; PRK photorefractive keratectomy; LASIK laser assisted in situ keratomileusis; HTN hypertension;  DM diabetes mellitus; COPD chronic obstructive pulmonary disease

## 2017-11-06 ENCOUNTER — Ambulatory Visit (INDEPENDENT_AMBULATORY_CARE_PROVIDER_SITE_OTHER): Payer: Medicare HMO | Admitting: Ophthalmology

## 2017-11-06 ENCOUNTER — Encounter (INDEPENDENT_AMBULATORY_CARE_PROVIDER_SITE_OTHER): Payer: Self-pay | Admitting: Ophthalmology

## 2017-11-06 DIAGNOSIS — Z961 Presence of intraocular lens: Secondary | ICD-10-CM

## 2017-11-06 DIAGNOSIS — H43822 Vitreomacular adhesion, left eye: Secondary | ICD-10-CM | POA: Diagnosis not present

## 2017-11-06 DIAGNOSIS — H3581 Retinal edema: Secondary | ICD-10-CM

## 2017-11-06 DIAGNOSIS — L511 Stevens-Johnson syndrome: Secondary | ICD-10-CM | POA: Diagnosis not present

## 2017-11-06 DIAGNOSIS — H26493 Other secondary cataract, bilateral: Secondary | ICD-10-CM

## 2017-11-06 DIAGNOSIS — H35033 Hypertensive retinopathy, bilateral: Secondary | ICD-10-CM

## 2017-11-06 DIAGNOSIS — H34831 Tributary (branch) retinal vein occlusion, right eye, with macular edema: Secondary | ICD-10-CM | POA: Diagnosis not present

## 2017-11-06 MED ORDER — AFLIBERCEPT 2MG/0.05ML IZ SOLN FOR KALEIDOSCOPE
2.0000 mg | INTRAVITREAL | Status: AC
  Administered 2017-11-06: 2 mg via INTRAVITREAL

## 2017-12-15 NOTE — Progress Notes (Signed)
[facility] Note  12/18/2017     CHIEF COMPLAINT Patient presents for Retina Follow Up   HISTORY OF PRESENT ILLNESS: Natalie Johnson is a 72 y.o. female who presents to the clinic today for:   HPI    Retina Follow Up    In right eye.  This started 4 months ago.  Severity is mild.  Since onset it is stable.  I, the attending physician,  performed the HPI with the patient and updated documentation appropriately.          Comments    F/U BRVO OD. Patient states her vision is "doing ok",denies new visual issues. Pt reports she received new rx glass but had to return them to have adjustment done.Pt states she has noticed she is having issues when she watches t.v due to her eye lids drooping  to low. Pt is ready for tx today if indicted.       Last edited by [name], MD on 12/18/2017  1:40 PM. (History)      Referring physician: [name], MD [place], [place] [id number]  HISTORICAL INFORMATION:   Selected notes from the [facility] Referral from Dr. Read Drivers for concern of BRVO OD;  LEE- 11.8.18 (Dr. Read Drivers) Ocular Hx- pseudophakia OU (2015, in [place], Surgeon: unknown), Hx of injections for 'retinal bleeding' [last injection around June 2018], SJS, HTN ret OU;  PMH- SJS, thyroid disease, high chol., fibromyalgia;    CURRENT MEDICATIONS: No current outpatient medications on file. (Ophthalmic Drugs)   Current Facility-Administered Medications (Ophthalmic Drugs)  Medication Route  . aflibercept (EYLEA) SOLN 2 mg Intravitreal  . aflibercept (EYLEA) SOLN 2 mg Intravitreal  . aflibercept (EYLEA) SOLN 2 mg Intravitreal  . aflibercept (EYLEA) SOLN 2 mg Intravitreal  . aflibercept (EYLEA) SOLN 2 mg Intravitreal  . aflibercept (EYLEA) SOLN 2 mg Intravitreal   Current Outpatient Medications (Other)  Medication Sig  . amitriptyline (ELAVIL) 100 MG tablet   . clonazePAM (KLONOPIN) 2 MG tablet   . doxycycline  (VIBRA-TABS) 100 MG tablet Take 100 mg by mouth 2 (two) times daily. for 7 days  . DULoxetine (CYMBALTA) 30 MG capsule   . fludrocortisone (FLORINEF) 0.1 MG tablet Take 0.1 mg daily by mouth.  . levothyroxine (SYNTHROID, LEVOTHROID) 100 MCG tablet   . lovastatin (MEVACOR) 40 MG tablet   . omeprazole (PRILOSEC) 40 MG capsule   . potassium chloride (MICRO-K) 10 MEQ CR capsule   . rizatriptan (MAXALT) 10 MG tablet   . triamcinolone cream (KENALOG) 0.1 % APPLY CREAM EXTERNALLY TO AFFECTED AREA THREE TIMES DAILY AS NEEDED FOR DERMATITIS   Current Facility-Administered Medications (Other)  Medication Route  . Bevacizumab (AVASTIN) SOLN 1.25 mg Intravitreal  . Bevacizumab (AVASTIN) SOLN 1.25 mg Intravitreal  . Bevacizumab (AVASTIN) SOLN 1.25 mg Intravitreal  . Bevacizumab (AVASTIN) SOLN 1.25 mg Intravitreal  . Bevacizumab (AVASTIN) SOLN 1.25 mg Intravitreal      REVIEW OF SYSTEMS: ROS    Positive for: Eyes   Negative for: Constitutional, Gastrointestinal, Neurological, Skin, Genitourinary, Musculoskeletal, HENT, Endocrine, Cardiovascular, Respiratory, Psychiatric, Allergic/Imm, Heme/Lymph   Last edited by Zenovia Jordan, LPN on 07/27/3265  1:24 PM. (History)       ALLERGIES Allergies  Allergen Reactions  . Augmentin [Amoxicillin-Pot Clavulanate] Anaphylaxis  . Trihexyphenidyl Hcl Anaphylaxis  . Vancomycin Anaphylaxis  . Amoxicillin Hives  . Penicillins Hives  . Piperacillin Hives  . Potassium-Containing Compounds   . Sodium Acetylsalicylate [Aspirin] Hives  .  Soma [Carisoprodol] Hives  . Linezolid Rash    PAST MEDICAL HISTORY Past Medical History:  Diagnosis Date  . Ankle fracture   . Neuropathy   . Stevens-Johnson disease [facility])    Past Surgical History:  Procedure Laterality Date  . ABDOMINAL HYSTERECTOMY    . CATARACT EXTRACTION  2014   OU  . ELBOW FRACTURE SURGERY    . GALLBLADDER SURGERY    . KNEE ARTHROSCOPY    . LUNG LOBECTOMY    . SHOULDER SURGERY    .  TONSILLECTOMY      FAMILY HISTORY Family History  Problem Relation Age of Onset  . Macular degeneration Mother   . Glaucoma Father     SOCIAL HISTORY Social History   Tobacco Use  . Smoking status: Never Smoker  . Smokeless tobacco: Never Used  Substance Use Topics  . Alcohol use: No    Frequency: Never  . Drug use: No         OPHTHALMIC EXAM:  Base Eye Exam    Visual Acuity (Snellen - Linear)      Right Left   Dist [place] 20/40 20/25   Dist ph [place] 20/25 NI       Tonometry (Tonopen, 1:29 PM)      Right Left   Pressure 14 16       Pupils      Dark Light Shape React APD   Right 4 3 Round Brisk None   Left 4 3 Round Brisk None       Visual Fields (Counting fingers)      Left Right    Full Full       Extraocular Movement      Right Left    Full, Ortho Full, Ortho       Neuro/Psych    Oriented x3:  Yes   Mood/Affect:  Normal       Dilation    Both eyes:  1.0% Mydriacyl, 2.5% Phenylephrine @ 1:29 PM        Slit Lamp and Fundus Exam    External Exam      Right Left   External Normal Normal       Slit Lamp Exam      Right Left   Lids/Lashes Dermatochalasis - upper lid Dermatochalasis - upper lid   Conjunctiva/Sclera White and quiet White and quiet   Cornea Arcus, 1+ central Punctate epithelial erosions Arcus, 1+ Punctate epithelial erosions   Anterior Chamber Deep and quiet Deep and quiet   Iris Round and dilated to 25m; no NVI Round and dilated to 8536m  Lens Posterior chamber intraocular lens, Open PC, inferior border of PC is [id number] lens edge Posterior chamber intraocular lens, open PC   Vitreous Vitreous syneresis, Posterior vitreous detachment, mild Asteroid hyalosis Vitreous syneresis       Fundus Exam      Right Left   Disc Normal, vascular loops Normal   C/D Ratio 0.55 0.5   Macula Blunted foveal reflex, interval improvement in edema/cystic changes superior to fovea, pigment clumping nasal to fovea, mild cluster of exudates ST to  fovea -- improved, Epiretinal membrane Flat, single nasal MAs, Retinal pigment epithelial mottling   Vessels Tortuous, Vascular attenuation Mild tortuosity , Vascular attenuation   Periphery Attached, rare dot hemorrhages, punctate pigmented lesion at 1200 Attached          IMAGING AND PROCEDURES  Imaging and Procedures for 07/04/17  OCT, Retina - OU - Both Eyes  Right Eye Quality was good. Central Foveal Thickness: 242. Progression has improved. Findings include intraretinal fluid, no SRF, normal foveal contour (Interval improvement in IRF superior to fovea, no SRF).   Left Eye Quality was good. Central Foveal Thickness: 266. Progression has been stable. Findings include normal foveal contour, no IRF, no SRF, vitreomacular adhesion .   Notes Images taken, stored on drive  Diagnosis / Impression:  OD: BRVO with interval improvement in IRF superior to fovea OS: VMA, No IRF, No SRF  Clinical management:  See below  Abbreviations: NFP - Normal foveal profile. CME - cystoid macular edema. PED - pigment epithelial detachment. IRF - intraretinal fluid. SRF - subretinal fluid. EZ - ellipsoid zone. ERM - epiretinal membrane. ORA - outer retinal atrophy. ORT - outer retinal tubulation. SRHM - subretinal hyper-reflective material         Intravitreal Injection, Pharmacologic Agent - OD - Right Eye       Time Out 12/18/2017. 1:57 PM. Confirmed correct patient, procedure, site, and patient consented.   Anesthesia Topical anesthesia was used. Anesthetic medications included Lidocaine 2%, Proparacaine 0.5%.   Procedure Preparation included eyelid speculum, 5% betadine to ocular surface. A 30 gauge needle was used.   Injection:  2 mg aflibercept 2 MG/0.05ML   NDC: 61755-005-02, Lot: [id number], Expiration date: 10/18/2018   Route: Intravitreal, Site: Right Eye, Waste: 0.05 mL  Post-op Post injection exam found visual acuity of at least counting fingers. The patient  tolerated the procedure well. There were no complications. The patient received written and verbal post procedure care education.                 ASSESSMENT/PLAN:    ICD-10-CM   1. Branch retinal vein occlusion of right eye with macular edema H34.8310 OCT, Retina - OU - Both Eyes    Intravitreal Injection, Pharmacologic Agent - OD - Right Eye    aflibercept (EYLEA) SOLN 2 mg  2. Vitreomacular adhesion of left eye H43.822   3. Hypertensive retinopathy of both eyes H35.033   4. Pseudophakia of both eyes Z96.1   5. PCO (posterior capsular opacification), bilateral H26.493   6. Stevens-Johnson syndrome (HCC) L51.1   7. Retinal edema H35.81 OCT, Retina - OU - Both Eyes    1. BRVO with ME OD-  - moved here from [place] in July 2018, was receiving unknown injections, treat and extend, OD - last [place] injection OD in June 2018 - initially presented with subjective worsening of vision OD since July - s/p IVA OD #1 (11.13.18), #2 (12.12.18), #3 (01.15.19), #4 (02.13.19), #5 (03.18.19) - pt approved for Eylea - s/p IVE OD #1 (04.16.19), #2 (05.21.19), #3 (06.24.19), #4 (07.22.19), #5 (08.19.19) - today, OCT with interval improvement in IRF at 6 weeks - most recent FA (8.19.19) without significant leakage or targets for focal laser - BCVA also improved to 20/25 from 20/30 OD today - discussed possibility that this may be new baseline - recommend IVE OD #6 today (09.30.19) with extension to 8 weeks - RBA of procedure discussed, questions answered - informed consent obtained and signed - see procedure note - F/U 8 weeks, DFE, OCT  2. VMA OS - mild without traction - stable - monitor  3. Hypertensive Retinopathy OU-  - stable - discussed importance of tight BP control - monitor  4. Pseudophakia OU-  - s/p CE/IOL OU - beautiful surgery, doing well - monitor  5. PCO OU-  - S/P YAG Cap procedure OD (01.24.19) - doing  well  6. SJS w/ mild DES OU - continue using artificial tears  and lubricating ointment as needed    Ophthalmic Meds Ordered this visit:  Meds ordered this encounter  Medications  . aflibercept (EYLEA) SOLN 2 mg       Return in about 8 weeks (around 02/12/2018) for F/U BRVO OD, DFE, OCT.  There are no Patient Instructions on file for this visit.   Explained the diagnoses, plan, and follow up with the patient and they expressed understanding.  Patient expressed understanding of the importance of proper follow up care.   This document serves as a record of services personally performed by Gardiner Sleeper, MD, PhD. It was created on their behalf by Catha Brow, [place], a certified ophthalmic assistant. The creation of this record is the provider's dictation and/or activities during the visit.  Electronically signed by: Catha Brow, [place]  09.27.19 3:55 PM   Gardiner Sleeper, M.D., Ph.D. Diseases & Surgery of the Retina and Vitreous [facility]   I have reviewed the above documentation for accuracy and completeness, and I agree with the above. Gardiner Sleeper, M.D., Ph.D. 12/18/17 3:57 PM   Abbreviations: M myopia (nearsighted); A astigmatism; H hyperopia (farsighted); P presbyopia; Mrx spectacle prescription;  CTL contact lenses; OD right eye; OS left eye; OU both eyes  XT exotropia; ET esotropia; PEK punctate epithelial keratitis; PEE punctate epithelial erosions; DES dry eye syndrome; MGD meibomian gland dysfunction; ATs artificial tears; PFAT's preservative free artificial tears; [place] nuclear sclerotic cataract; PSC posterior subcapsular cataract; ERM epi-retinal membrane; PVD posterior vitreous detachment; RD retinal detachment; DM diabetes mellitus; DR diabetic retinopathy; NPDR non-proliferative diabetic retinopathy; PDR proliferative diabetic retinopathy; CSME clinically significant macular edema; DME diabetic macular edema; dbh dot blot hemorrhages; CWS cotton wool spot; POAG primary open angle glaucoma; C/D  cup-to-disc ratio; HVF humphrey visual field; GVF goldmann visual field; OCT optical coherence tomography; IOP intraocular pressure; BRVO Branch retinal vein occlusion; CRVO central retinal vein occlusion; CRAO central retinal artery occlusion; BRAO branch retinal artery occlusion; RT retinal tear; SB scleral buckle; PPV pars plana vitrectomy; VH Vitreous hemorrhage; PRP panretinal laser photocoagulation; IVK intravitreal kenalog; VMT vitreomacular traction; MH Macular hole;  NVD neovascularization of the disc; NVE neovascularization elsewhere; AREDS age related eye disease study; ARMD age related macular degeneration; POAG primary open angle glaucoma; EBMD epithelial/anterior basement membrane dystrophy; ACIOL anterior chamber intraocular lens; IOL intraocular lens; PCIOL posterior chamber intraocular lens; Phaco/IOL phacoemulsification with intraocular lens placement; [place] photorefractive keratectomy; LASIK laser assisted in situ keratomileusis; HTN hypertension; DM diabetes mellitus; COPD chronic obstructive pulmonary disease

## 2017-12-18 ENCOUNTER — Ambulatory Visit (INDEPENDENT_AMBULATORY_CARE_PROVIDER_SITE_OTHER): Payer: Medicare HMO | Admitting: Ophthalmology

## 2017-12-18 ENCOUNTER — Encounter (INDEPENDENT_AMBULATORY_CARE_PROVIDER_SITE_OTHER): Payer: Self-pay | Admitting: Ophthalmology

## 2017-12-18 DIAGNOSIS — H34831 Tributary (branch) retinal vein occlusion, right eye, with macular edema: Secondary | ICD-10-CM | POA: Diagnosis not present

## 2017-12-18 DIAGNOSIS — L511 Stevens-Johnson syndrome: Secondary | ICD-10-CM

## 2017-12-18 DIAGNOSIS — H3581 Retinal edema: Secondary | ICD-10-CM

## 2017-12-18 DIAGNOSIS — H26493 Other secondary cataract, bilateral: Secondary | ICD-10-CM | POA: Diagnosis not present

## 2017-12-18 DIAGNOSIS — Z961 Presence of intraocular lens: Secondary | ICD-10-CM

## 2017-12-18 DIAGNOSIS — H43822 Vitreomacular adhesion, left eye: Secondary | ICD-10-CM

## 2017-12-18 DIAGNOSIS — H35033 Hypertensive retinopathy, bilateral: Secondary | ICD-10-CM

## 2017-12-18 MED ORDER — AFLIBERCEPT 2MG/0.05ML IZ SOLN FOR KALEIDOSCOPE
2.0000 mg | INTRAVITREAL | Status: AC
  Administered 2017-12-18: 2 mg via INTRAVITREAL

## 2018-01-30 DIAGNOSIS — H3561 Retinal hemorrhage, right eye: Secondary | ICD-10-CM | POA: Diagnosis not present

## 2018-01-30 DIAGNOSIS — H35033 Hypertensive retinopathy, bilateral: Secondary | ICD-10-CM | POA: Diagnosis not present

## 2018-01-30 DIAGNOSIS — Z961 Presence of intraocular lens: Secondary | ICD-10-CM | POA: Diagnosis not present

## 2018-01-30 DIAGNOSIS — H34831 Tributary (branch) retinal vein occlusion, right eye, with macular edema: Secondary | ICD-10-CM | POA: Diagnosis not present

## 2018-02-09 NOTE — Progress Notes (Signed)
[facility] Note  02/12/2018     CHIEF COMPLAINT Patient presents for Retina Follow Up   HISTORY OF PRESENT ILLNESS: Natalie Johnson is a 72 y.o. female who presents to the clinic today for:   HPI    Retina Follow Up    Patient presents with  CRVO/BRVO.  In right eye.  Severity is moderate.  Duration of 8 weeks.  I, the attending physician,  performed the HPI with the patient and updated documentation appropriately.          Comments    Patient states vision the same OU. No eye pain/discomfort.       Last edited by [name], MD on 02/12/2018  3:32 PM. (History)    pt is doing well, she is leaving tomorrow to go to [place] for Thanksgiving  Referring physician: [name], MD [place], [place] [address]  HISTORICAL INFORMATION:   Selected notes from the [facility] Referral from Dr. Read Drivers for concern of BRVO OD;  LEE- 11.8.18 (Dr. Read Drivers) Ocular Hx- pseudophakia OU (2015, in [place], Surgeon: unknown), Hx of injections for 'retinal bleeding' [last injection around June 2018], SJS, HTN ret OU;  PMH- SJS, thyroid disease, high chol., fibromyalgia;    CURRENT MEDICATIONS: No current outpatient medications on file. (Ophthalmic Drugs)   Current Facility-Administered Medications (Ophthalmic Drugs)  Medication Route  . aflibercept (EYLEA) SOLN 2 mg Intravitreal  . aflibercept (EYLEA) SOLN 2 mg Intravitreal  . aflibercept (EYLEA) SOLN 2 mg Intravitreal  . aflibercept (EYLEA) SOLN 2 mg Intravitreal  . aflibercept (EYLEA) SOLN 2 mg Intravitreal  . aflibercept (EYLEA) SOLN 2 mg Intravitreal  . aflibercept (EYLEA) SOLN 2 mg Intravitreal   Current Outpatient Medications (Other)  Medication Sig  . amitriptyline (ELAVIL) 100 MG tablet   . clonazePAM (KLONOPIN) 2 MG tablet   . doxycycline (VIBRA-TABS) 100 MG tablet Take 100 mg by mouth 2 (two) times daily. for 7 days  . DULoxetine (CYMBALTA) 30 MG capsule   .  fludrocortisone (FLORINEF) 0.1 MG tablet Take 0.1 mg daily by mouth.  . levothyroxine (SYNTHROID, LEVOTHROID) 100 MCG tablet   . lovastatin (MEVACOR) 40 MG tablet   . omeprazole (PRILOSEC) 40 MG capsule   . potassium chloride (MICRO-K) 10 MEQ CR capsule   . rizatriptan (MAXALT) 10 MG tablet   . triamcinolone cream (KENALOG) 0.1 % APPLY CREAM EXTERNALLY TO AFFECTED AREA THREE TIMES DAILY AS NEEDED FOR DERMATITIS   Current Facility-Administered Medications (Other)  Medication Route  . Bevacizumab (AVASTIN) SOLN 1.25 mg Intravitreal  . Bevacizumab (AVASTIN) SOLN 1.25 mg Intravitreal  . Bevacizumab (AVASTIN) SOLN 1.25 mg Intravitreal  . Bevacizumab (AVASTIN) SOLN 1.25 mg Intravitreal  . Bevacizumab (AVASTIN) SOLN 1.25 mg Intravitreal      REVIEW OF SYSTEMS: ROS    Positive for: Eyes   Negative for: Constitutional, Gastrointestinal, Neurological, Skin, Genitourinary, Musculoskeletal, HENT, Endocrine, Cardiovascular, Respiratory, Psychiatric, Allergic/Imm, Heme/Lymph   Last edited by [name] on 02/12/2018  2:24 PM. (History)       ALLERGIES Allergies  Allergen Reactions  . Augmentin [Amoxicillin-Pot Clavulanate] Anaphylaxis  . Trihexyphenidyl Hcl Anaphylaxis  . Vancomycin Anaphylaxis  . Amoxicillin Hives  . Penicillins Hives  . Piperacillin Hives  . Potassium-Containing Compounds   . Sodium Acetylsalicylate [Aspirin] Hives  . Soma [Carisoprodol] Hives  . Linezolid Rash    PAST MEDICAL HISTORY Past Medical History:  Diagnosis Date  . Ankle fracture   . Neuropathy   .  Stevens-Johnson disease [facility])    Past Surgical History:  Procedure Laterality Date  . ABDOMINAL HYSTERECTOMY    . CATARACT EXTRACTION  2014   OU  . ELBOW FRACTURE SURGERY    . GALLBLADDER SURGERY    . KNEE ARTHROSCOPY    . LUNG LOBECTOMY    . SHOULDER SURGERY    . TONSILLECTOMY      FAMILY HISTORY Family History  Problem Relation Age of Onset  . Macular degeneration Mother   . Glaucoma  Father     SOCIAL HISTORY Social History   Tobacco Use  . Smoking status: Never Smoker  . Smokeless tobacco: Never Used  Substance Use Topics  . Alcohol use: No    Frequency: Never  . Drug use: No         OPHTHALMIC EXAM:  Base Eye Exam    Visual Acuity (+Snellen - Linear)      Right Left   Dist [place] 20/30 -2 20/25 +2   Dist ph [place] 20/30 +2 20/25       Tonometry (Tonopen, 2:29 PM)      Right Left   Pressure 14 13       Pupils      Dark Light Shape React APD   Right 4 3 Round Brisk None   Left 4 3 Round Brisk None       Visual Fields (Counting fingers)      Left Right    Full Full       Extraocular Movement      Right Left    Full, Ortho Full, Ortho       Neuro/Psych    Oriented x3:  Yes   Mood/Affect:  Normal       Dilation    Both eyes:  1.0% Mydriacyl, 2.5% Phenylephrine @ 2:29 PM        Slit Lamp and Fundus Exam    External Exam      Right Left   External Normal Normal       Slit Lamp Exam      Right Left   Lids/Lashes Dermatochalasis - upper lid Dermatochalasis - upper lid   Conjunctiva/Sclera White and quiet White and quiet   Cornea Arcus, 1+ central Punctate epithelial erosions Arcus, 1+ Punctate epithelial erosions   Anterior Chamber Deep and quiet Deep and quiet   Iris Round and dilated to 38m; no NVI Round and dilated to 864m  Lens Posterior chamber intraocular lens, Open PC, inferior border of PC is 17m11mbove lens edge Posterior chamber intraocular lens, open PC   Vitreous Vitreous syneresis, Posterior vitreous detachment, mild Asteroid hyalosis Vitreous syneresis       Fundus Exam      Right Left   Disc Normal, vascular loops Normal   C/D Ratio 0.55 0.5   Macula Blunted foveal reflex, persistent edema/cystic changes superior to fovea, pigment clumping nasal to fovea, Epiretinal membrane Flat, single nasal MAs, Retinal pigment epithelial mottling   Vessels Tortuous, Vascular attenuation Mild tortuosity , Vascular attenuation    Periphery Attached, rare dot hemorrhages, punctate pigmented lesion at 1200 Attached          IMAGING AND PROCEDURES  Imaging and Procedures for 07/04/17  OCT, Retina - OU - Both Eyes       Right Eye Quality was good. Central Foveal Thickness: 240. Findings include intraretinal fluid, no SRF, normal foveal contour (Persistent IRF superior to fovea, no SRF).   Left Eye Quality was good. Central  Foveal Thickness: 267. Progression has been stable. Findings include normal foveal contour, no IRF, no SRF, vitreomacular adhesion .   Notes Images taken, stored on drive  Diagnosis / Impression:  OD: BRVO with persistent IRF superior to fovea OS: VMA, No IRF, No SRF  Clinical management:  See below  Abbreviations: NFP - Normal foveal profile. CME - cystoid macular edema. PED - pigment epithelial detachment. IRF - intraretinal fluid. SRF - subretinal fluid. EZ - ellipsoid zone. ERM - epiretinal membrane. ORA - outer retinal atrophy. ORT - outer retinal tubulation. SRHM - subretinal hyper-reflective material         Intravitreal Injection, Pharmacologic Agent - OD - Right Eye       Time Out 02/12/2018. 3:44 PM. Confirmed correct patient, procedure, site, and patient consented.   Anesthesia Topical anesthesia was used. Anesthetic medications included Lidocaine 2%, Proparacaine 0.5%.   Procedure Preparation included 5% betadine to ocular surface, eyelid speculum. A 30 gauge needle was used.   Injection:  2 mg aflibercept 2 MG/0.05ML   NDC: 61755-005-02, Lot: [id number], Expiration date: 12/19/2018   Route: Intravitreal, Site: Right Eye, Waste: 0.05 mL  Post-op Post injection exam found visual acuity of at least counting fingers. The patient tolerated the procedure well. There were no complications. The patient received written and verbal post procedure care education.                 ASSESSMENT/PLAN:    ICD-10-CM   1. Branch retinal vein occlusion of right eye with  macular edema H34.8310 Intravitreal Injection, Pharmacologic Agent - OD - Right Eye    aflibercept (EYLEA) SOLN 2 mg  2. Vitreomacular adhesion of left eye H43.822   3. Hypertensive retinopathy of both eyes H35.033   4. Pseudophakia of both eyes Z96.1   5. PCO (posterior capsular opacification), bilateral H26.493   6. Stevens-Johnson syndrome (HCC) L51.1   7. Retinal edema H35.81 OCT, Retina - OU - Both Eyes    1. BRVO with ME OD-  - moved here from [place] in July 2018, was receiving unknown injections, treat and extend, OD - last [place] injection OD in June 2018 - initially presented with subjective worsening of vision OD since July - s/p IVA OD #1 (11.13.18), #2 (12.12.18), #3 (01.15.19), #4 (02.13.19), #5 (03.18.19) - pt approved for Eylea - s/p IVE OD #1 (04.16.19), #2 (05.21.19), #3 (06.24.19), #4 (07.22.19), #5 (08.19.19), #6 (09.30.19) - today, OCT with persistent IRF at 8 weeks - most recent FA (8.19.19) without significant leakage or targets for focal laser - BCVA 20/30+2 OD today - recommend IVE OD #7 today (11.25.19)  - RBA of procedure discussed, questions answered - informed consent obtained and signed - see procedure note - F/U 8 weeks, DFE, OCT  2. VMA OS - mild without traction - stable - monitor  3. Hypertensive Retinopathy OU-  - stable - discussed importance of tight BP control - monitor  4. Pseudophakia OU-  - s/p CE/IOL OU - beautiful surgery, doing well - monitor  5. PCO OU-  - S/P YAG Cap procedure OD (01.24.19) - doing well  6. SJS w/ mild DES OU - continue using artificial tears and lubricating ointment as needed    Ophthalmic Meds Ordered this visit:  Meds ordered this encounter  Medications  . aflibercept (EYLEA) SOLN 2 mg       Return in about 8 weeks (around 04/09/2018) for F/U BRVO, DFE, OCT.  There are no Patient Instructions on file for this  visit.   Explained the diagnoses, plan, and follow up with the patient and they  expressed understanding.  Patient expressed understanding of the importance of proper follow up care.   This document serves as a record of services personally performed by Gardiner Sleeper, MD, PhD. It was created on their behalf by Ernest Mallick, OA, an ophthalmic assistant. The creation of this record is the provider's dictation and/or activities during the visit.    Electronically signed by: Ernest Mallick, OA  11.22.19 10:38 PM    Gardiner Sleeper, M.D., Ph.D. Diseases & Surgery of the Retina and Vitreous [facility]   I have reviewed the above documentation for accuracy and completeness, and I agree with the above. Gardiner Sleeper, M.D., Ph.D. 02/12/18 10:39 PM    Abbreviations: M myopia (nearsighted); A astigmatism; H hyperopia (farsighted); P presbyopia; Mrx spectacle prescription;  CTL contact lenses; OD right eye; OS left eye; OU both eyes  XT exotropia; ET esotropia; PEK punctate epithelial keratitis; PEE punctate epithelial erosions; DES dry eye syndrome; MGD meibomian gland dysfunction; ATs artificial tears; PFAT's preservative free artificial tears; [place] nuclear sclerotic cataract; PSC posterior subcapsular cataract; ERM epi-retinal membrane; PVD posterior vitreous detachment; RD retinal detachment; DM diabetes mellitus; DR diabetic retinopathy; NPDR non-proliferative diabetic retinopathy; PDR proliferative diabetic retinopathy; CSME clinically significant macular edema; DME diabetic macular edema; dbh dot blot hemorrhages; CWS cotton wool spot; POAG primary open angle glaucoma; C/D cup-to-disc ratio; HVF humphrey visual field; GVF goldmann visual field; OCT optical coherence tomography; IOP intraocular pressure; BRVO Branch retinal vein occlusion; CRVO central retinal vein occlusion; CRAO central retinal artery occlusion; BRAO branch retinal artery occlusion; RT retinal tear; SB scleral buckle; PPV pars plana vitrectomy; VH Vitreous hemorrhage; PRP panretinal laser  photocoagulation; IVK intravitreal kenalog; VMT vitreomacular traction; MH Macular hole;  NVD neovascularization of the disc; NVE neovascularization elsewhere; AREDS age related eye disease study; ARMD age related macular degeneration; POAG primary open angle glaucoma; EBMD epithelial/anterior basement membrane dystrophy; ACIOL anterior chamber intraocular lens; IOL intraocular lens; PCIOL posterior chamber intraocular lens; Phaco/IOL phacoemulsification with intraocular lens placement; [place] photorefractive keratectomy; LASIK laser assisted in situ keratomileusis; HTN hypertension; DM diabetes mellitus; COPD chronic obstructive pulmonary disease

## 2018-02-12 ENCOUNTER — Encounter (INDEPENDENT_AMBULATORY_CARE_PROVIDER_SITE_OTHER): Payer: Self-pay | Admitting: Ophthalmology

## 2018-02-12 ENCOUNTER — Ambulatory Visit (INDEPENDENT_AMBULATORY_CARE_PROVIDER_SITE_OTHER): Payer: Medicare HMO | Admitting: Ophthalmology

## 2018-02-12 DIAGNOSIS — L511 Stevens-Johnson syndrome: Secondary | ICD-10-CM

## 2018-02-12 DIAGNOSIS — H34831 Tributary (branch) retinal vein occlusion, right eye, with macular edema: Secondary | ICD-10-CM | POA: Diagnosis not present

## 2018-02-12 DIAGNOSIS — H43822 Vitreomacular adhesion, left eye: Secondary | ICD-10-CM

## 2018-02-12 DIAGNOSIS — H3581 Retinal edema: Secondary | ICD-10-CM

## 2018-02-12 DIAGNOSIS — H26493 Other secondary cataract, bilateral: Secondary | ICD-10-CM

## 2018-02-12 DIAGNOSIS — H35033 Hypertensive retinopathy, bilateral: Secondary | ICD-10-CM

## 2018-02-12 DIAGNOSIS — Z961 Presence of intraocular lens: Secondary | ICD-10-CM

## 2018-02-12 MED ORDER — AFLIBERCEPT 2MG/0.05ML IZ SOLN FOR KALEIDOSCOPE
2.0000 mg | INTRAVITREAL | Status: AC
  Administered 2018-02-12: 2 mg via INTRAVITREAL

## 2018-03-13 ENCOUNTER — Inpatient Hospital Stay (HOSPITAL_COMMUNITY): Payer: Medicare HMO

## 2018-03-13 ENCOUNTER — Encounter (HOSPITAL_COMMUNITY): Payer: Self-pay | Admitting: *Deleted

## 2018-03-13 ENCOUNTER — Inpatient Hospital Stay (HOSPITAL_COMMUNITY)
Admission: EM | Admit: 2018-03-13 | Discharge: 2018-03-18 | DRG: 439 | Disposition: A | Discharge status: Home / Self Care | Payer: Medicare HMO | Attending: Internal Medicine | Admitting: Internal Medicine

## 2018-03-13 ENCOUNTER — Other Ambulatory Visit: Payer: Self-pay

## 2018-03-13 ENCOUNTER — Emergency Department (HOSPITAL_COMMUNITY): Payer: Medicare HMO

## 2018-03-13 DIAGNOSIS — Z9842 Cataract extraction status, left eye: Secondary | ICD-10-CM | POA: Diagnosis not present

## 2018-03-13 DIAGNOSIS — Z886 Allergy status to analgesic agent status: Secondary | ICD-10-CM

## 2018-03-13 DIAGNOSIS — R197 Diarrhea, unspecified: Secondary | ICD-10-CM | POA: Diagnosis not present

## 2018-03-13 DIAGNOSIS — R1114 Bilious vomiting: Secondary | ICD-10-CM | POA: Diagnosis not present

## 2018-03-13 DIAGNOSIS — M25552 Pain in left hip: Secondary | ICD-10-CM | POA: Diagnosis not present

## 2018-03-13 DIAGNOSIS — R0789 Other chest pain: Secondary | ICD-10-CM | POA: Diagnosis not present

## 2018-03-13 DIAGNOSIS — R748 Abnormal levels of other serum enzymes: Secondary | ICD-10-CM | POA: Diagnosis not present

## 2018-03-13 DIAGNOSIS — E876 Hypokalemia: Secondary | ICD-10-CM | POA: Diagnosis present

## 2018-03-13 DIAGNOSIS — R0989 Other specified symptoms and signs involving the circulatory and respiratory systems: Secondary | ICD-10-CM | POA: Diagnosis not present

## 2018-03-13 DIAGNOSIS — Z9049 Acquired absence of other specified parts of digestive tract: Secondary | ICD-10-CM

## 2018-03-13 DIAGNOSIS — Z888 Allergy status to other drugs, medicaments and biological substances status: Secondary | ICD-10-CM | POA: Diagnosis not present

## 2018-03-13 DIAGNOSIS — L511 Stevens-Johnson syndrome: Secondary | ICD-10-CM | POA: Diagnosis present

## 2018-03-13 DIAGNOSIS — G629 Polyneuropathy, unspecified: Secondary | ICD-10-CM | POA: Diagnosis not present

## 2018-03-13 DIAGNOSIS — M24651 Ankylosis, right hip: Secondary | ICD-10-CM

## 2018-03-13 DIAGNOSIS — E039 Hypothyroidism, unspecified: Secondary | ICD-10-CM | POA: Diagnosis not present

## 2018-03-13 DIAGNOSIS — K8500 Idiopathic acute pancreatitis without necrosis or infection: Principal | ICD-10-CM | POA: Diagnosis present

## 2018-03-13 DIAGNOSIS — Z83511 Family history of glaucoma: Secondary | ICD-10-CM | POA: Diagnosis not present

## 2018-03-13 DIAGNOSIS — N179 Acute kidney failure, unspecified: Secondary | ICD-10-CM | POA: Diagnosis not present

## 2018-03-13 DIAGNOSIS — R1111 Vomiting without nausea: Secondary | ICD-10-CM | POA: Diagnosis not present

## 2018-03-13 DIAGNOSIS — E860 Dehydration: Secondary | ICD-10-CM | POA: Diagnosis present

## 2018-03-13 DIAGNOSIS — Z79890 Hormone replacement therapy (postmenopausal): Secondary | ICD-10-CM | POA: Diagnosis not present

## 2018-03-13 DIAGNOSIS — Z9841 Cataract extraction status, right eye: Secondary | ICD-10-CM

## 2018-03-13 DIAGNOSIS — Z881 Allergy status to other antibiotic agents status: Secondary | ICD-10-CM

## 2018-03-13 DIAGNOSIS — K859 Acute pancreatitis without necrosis or infection, unspecified: Secondary | ICD-10-CM

## 2018-03-13 DIAGNOSIS — R1013 Epigastric pain: Secondary | ICD-10-CM | POA: Diagnosis not present

## 2018-03-13 DIAGNOSIS — M545 Low back pain: Secondary | ICD-10-CM | POA: Diagnosis present

## 2018-03-13 DIAGNOSIS — Z880 Allergy status to penicillin: Secondary | ICD-10-CM

## 2018-03-13 DIAGNOSIS — K851 Biliary acute pancreatitis without necrosis or infection: Secondary | ICD-10-CM

## 2018-03-13 DIAGNOSIS — M797 Fibromyalgia: Secondary | ICD-10-CM | POA: Diagnosis present

## 2018-03-13 DIAGNOSIS — M25551 Pain in right hip: Secondary | ICD-10-CM | POA: Diagnosis not present

## 2018-03-13 DIAGNOSIS — I959 Hypotension, unspecified: Secondary | ICD-10-CM | POA: Diagnosis not present

## 2018-03-13 DIAGNOSIS — R1084 Generalized abdominal pain: Secondary | ICD-10-CM | POA: Diagnosis not present

## 2018-03-13 DIAGNOSIS — E785 Hyperlipidemia, unspecified: Secondary | ICD-10-CM | POA: Diagnosis not present

## 2018-03-13 DIAGNOSIS — R740 Nonspecific elevation of levels of transaminase and lactic acid dehydrogenase [LDH]: Secondary | ICD-10-CM

## 2018-03-13 DIAGNOSIS — T50905A Adverse effect of unspecified drugs, medicaments and biological substances, initial encounter: Secondary | ICD-10-CM | POA: Diagnosis not present

## 2018-03-13 DIAGNOSIS — R079 Chest pain, unspecified: Secondary | ICD-10-CM | POA: Diagnosis not present

## 2018-03-13 DIAGNOSIS — G909 Disorder of the autonomic nervous system, unspecified: Secondary | ICD-10-CM | POA: Diagnosis not present

## 2018-03-13 DIAGNOSIS — R7401 Elevation of levels of liver transaminase levels: Secondary | ICD-10-CM

## 2018-03-13 HISTORY — DX: Migraine, unspecified, not intractable, without status migrainosus: G43.909

## 2018-03-13 HISTORY — DX: Gastro-esophageal reflux disease without esophagitis: K21.9

## 2018-03-13 HISTORY — DX: Systemic involvement of connective tissue, unspecified: M35.9

## 2018-03-13 HISTORY — DX: Basal cell carcinoma of skin, unspecified: C44.91

## 2018-03-13 HISTORY — DX: Fibromyalgia: M79.7

## 2018-03-13 HISTORY — DX: Acute pancreatitis without necrosis or infection, unspecified: K85.90

## 2018-03-13 HISTORY — DX: Hypothyroidism, unspecified: E03.9

## 2018-03-13 HISTORY — DX: Hyperlipidemia, unspecified: E78.5

## 2018-03-13 HISTORY — DX: Autonomic neuropathy in diseases classified elsewhere: G99.0

## 2018-03-13 HISTORY — DX: Gangrene and necrosis of lung: J85.0

## 2018-03-13 LAB — COMPREHENSIVE METABOLIC PANEL
ALT: 167 U/L — ABNORMAL HIGH (ref 0–44)
AST: 532 U/L — ABNORMAL HIGH (ref 15–41)
Albumin: 3.9 g/dL (ref 3.5–5.0)
Alkaline Phosphatase: 125 U/L (ref 38–126)
Anion gap: 10 (ref 5–15)
BUN: 22 mg/dL (ref 8–23)
CO2: 20 mmol/L — ABNORMAL LOW (ref 22–32)
Calcium: 10.7 mg/dL — ABNORMAL HIGH (ref 8.9–10.3)
Chloride: 110 mmol/L (ref 98–111)
Creatinine, Ser: 1.57 mg/dL — ABNORMAL HIGH (ref 0.44–1.00)
GFR calc Af Amer: 38 mL/min — ABNORMAL LOW (ref >60)
GFR calc non Af Amer: 33 mL/min — ABNORMAL LOW (ref >60)
Glucose, Bld: 112 mg/dL — ABNORMAL HIGH (ref 70–99)
Potassium: 4.1 mmol/L (ref 3.5–5.1)
Sodium: 140 mmol/L (ref 135–145)
Total Bilirubin: 0.8 mg/dL (ref 0.3–1.2)
Total Protein: 7 g/dL (ref 6.5–8.1)

## 2018-03-13 LAB — CBC
HCT: 39.2 % (ref 36.0–46.0)
Hemoglobin: 12.7 g/dL (ref 12.0–15.0)
MCH: 31.4 pg (ref 26.0–34.0)
MCHC: 32.4 g/dL (ref 30.0–36.0)
MCV: 96.8 fL (ref 80.0–100.0)
Platelets: 240 10*3/uL (ref 150–400)
RBC: 4.05 MIL/uL (ref 3.87–5.11)
RDW: 13.3 % (ref 11.5–15.5)
WBC: 8.7 10*3/uL (ref 4.0–10.5)
nRBC: 0 % (ref 0.0–0.2)

## 2018-03-13 LAB — I-STAT TROPONIN, ED: Troponin i, poc: 0 ng/mL (ref 0.00–0.08)

## 2018-03-13 LAB — LIPASE, BLOOD: Lipase: 798 U/L — ABNORMAL HIGH (ref 11–51)

## 2018-03-13 MED ORDER — FLUDROCORTISONE ACETATE 0.1 MG PO TABS
0.1000 mg | ORAL_TABLET | Freq: Every evening | ORAL | Status: DC
  Administered 2018-03-13 – 2018-03-17 (×5): 0.1 mg via ORAL
  Filled 2018-03-13 (×6): qty 1

## 2018-03-13 MED ORDER — SODIUM CHLORIDE 0.9 % IV SOLN: INTRAVENOUS | Status: DC

## 2018-03-13 MED ORDER — FENTANYL CITRATE (PF) 100 MCG/2ML IJ SOLN
50.0000 ug | Freq: Once | INTRAMUSCULAR | Status: AC
  Administered 2018-03-13: 50 ug via INTRAVENOUS
  Filled 2018-03-13: qty 2

## 2018-03-13 MED ORDER — MORPHINE SULFATE (PF) 4 MG/ML IV SOLN
4.0000 mg | INTRAVENOUS | Status: DC | PRN
  Administered 2018-03-14 – 2018-03-15 (×2): 4 mg via INTRAVENOUS
  Filled 2018-03-13 (×2): qty 1

## 2018-03-13 MED ORDER — SODIUM CHLORIDE 0.9 % IV SOLN
INTRAVENOUS | Status: DC
  Administered 2018-03-13 – 2018-03-14 (×3): via INTRAVENOUS

## 2018-03-13 MED ORDER — LEVOTHYROXINE SODIUM 100 MCG/5ML IV SOLN
50.0000 ug | Freq: Every day | INTRAVENOUS | Status: DC
  Administered 2018-03-13 – 2018-03-14 (×2): 50 ug via INTRAVENOUS
  Filled 2018-03-13 (×2): qty 5

## 2018-03-13 MED ORDER — ONDANSETRON HCL 4 MG/2ML IJ SOLN
4.0000 mg | Freq: Once | INTRAMUSCULAR | Status: AC
  Administered 2018-03-13: 4 mg via INTRAVENOUS
  Filled 2018-03-13: qty 2

## 2018-03-13 MED ORDER — SODIUM CHLORIDE 0.9 % IV BOLUS
500.0000 mL | Freq: Once | INTRAVENOUS | Status: AC
  Administered 2018-03-13: 500 mL via INTRAVENOUS

## 2018-03-13 MED ORDER — SODIUM CHLORIDE 0.9 % IV BOLUS
1000.0000 mL | Freq: Once | INTRAVENOUS | Status: AC
  Administered 2018-03-13: 1000 mL via INTRAVENOUS

## 2018-03-13 MED ORDER — ENOXAPARIN SODIUM 40 MG/0.4ML ~~LOC~~ SOLN
40.0000 mg | SUBCUTANEOUS | Status: DC
  Administered 2018-03-13 – 2018-03-17 (×5): 40 mg via SUBCUTANEOUS
  Filled 2018-03-13 (×5): qty 0.4

## 2018-03-13 MED ORDER — FLUDROCORTISONE ACETATE 0.1 MG PO TABS
0.2000 mg | ORAL_TABLET | Freq: Every morning | ORAL | Status: DC
  Administered 2018-03-14 – 2018-03-18 (×4): 0.2 mg via ORAL
  Filled 2018-03-13 (×5): qty 2

## 2018-03-13 MED ORDER — ACETAMINOPHEN 325 MG PO TABS
650.0000 mg | ORAL_TABLET | Freq: Four times a day (QID) | ORAL | Status: DC | PRN
  Administered 2018-03-18: 650 mg via ORAL
  Filled 2018-03-13 (×2): qty 2

## 2018-03-13 MED ORDER — FLUDROCORTISONE ACETATE 0.1 MG PO TABS: 0.1000 mg | ORAL_TABLET | ORAL | Status: DC

## 2018-03-13 MED ORDER — PANTOPRAZOLE SODIUM 40 MG IV SOLR
40.0000 mg | INTRAVENOUS | Status: DC
  Administered 2018-03-13: 40 mg via INTRAVENOUS
  Filled 2018-03-13: qty 40

## 2018-03-13 MED ORDER — CLONAZEPAM 1 MG PO TABS
2.0000 mg | ORAL_TABLET | Freq: Every day | ORAL | Status: DC
  Administered 2018-03-13: 2 mg via ORAL
  Filled 2018-03-13: qty 2

## 2018-03-13 MED ORDER — ACETAMINOPHEN 650 MG RE SUPP: 650.0000 mg | Freq: Four times a day (QID) | RECTAL | Status: DC | PRN

## 2018-03-13 NOTE — ED Notes (Signed)
Patient transported to Ultrasound 

## 2018-03-13 NOTE — ED Notes (Signed)
Patient transported to CT 

## 2018-03-13 NOTE — H&P (Signed)
History and Physical:    Natalie Johnson   XNT:[id number] DOB: 29-Sep-1945 DOA: 03/13/2018  Referring MD/provider: Margarita Mail, PA-C PCP: [name], MD   Patient coming from: Home  Chief Complaint: Abdominal pain  History of Present Illness:   Natalie Johnson is an 72 y.o. female with a PMH of autoimmune autonomic neuropathy, Stevens-Johnson syndrome, fibromyalgia, necrotizing lung infection requiring lobectomy, and prior cholecystectomy who presents with a 24-hour history of abdominal pain.  Pain is in the epigastric area and began last night after she ate soup.  Pain radiates to the left upper abdomen and back and she describes it as colicky/sharp.  She has had some associated nausea/vomiting and took omeprazole and Tums without any relief.  She is currently pain-free after being given IV pain medications in the ED.  ED Course:  The patient was noted to have stable vital signs, WBC 8.7, BUN 22, creatinine 1.57, calcium 10.7, and marked elevation of her serum lipase at 798.  GI has been consulted with plans for right upper quadrant ultrasound and possible MRCP.  ROS:   Review of Systems  Constitutional: Positive for weight loss. Negative for chills, diaphoresis, fever and malaise/fatigue.       22 lb intentional wt loss  Eyes:       Retinal bleed recently  Respiratory: Negative.   Cardiovascular: Negative.   Gastrointestinal: Positive for abdominal pain, diarrhea, heartburn, nausea and vomiting. Negative for blood in stool, constipation and melena.  Genitourinary: Negative.   Musculoskeletal: Positive for joint pain and myalgias.  Skin:       Friable skin from h/o Steven's Johnson syndrome  Neurological: Negative.   Endo/Heme/Allergies: Bruises/bleeds easily.    Past Medical History:   Past Medical History:  Diagnosis Date  . Ankle fracture   . Autoimmune autonomic neuropathy ([place])   . Stevens-Johnson disease [facility])     Past Surgical History:   Past  Surgical History:  Procedure Laterality Date  . ABDOMINAL HYSTERECTOMY    . CATARACT EXTRACTION  2014   OU  . CHOLECYSTECTOMY    . ELBOW FRACTURE SURGERY    . KNEE ARTHROSCOPY    . LUNG LOBECTOMY    . SHOULDER SURGERY    . TONSILLECTOMY      Social History:   Social History   Socioeconomic History  . Marital status: Widowed    Spouse name: Not on file  . Number of children: Not on file  . Years of education: Not on file  . Highest education level: Not on file  Occupational History  . Not on file  Social Needs  . Financial resource strain: Not on file  . Food insecurity:    Worry: Not on file    Inability: Not on file  . Transportation needs:    Medical: Not on file    Non-medical: Not on file  Tobacco Use  . Smoking status: Never Smoker  . Smokeless tobacco: Never Used  Substance and Sexual Activity  . Alcohol use: No    Frequency: Never  . Drug use: No  . Sexual activity: Not on file  Lifestyle  . Physical activity:    Days per week: Not on file    Minutes per session: Not on file  . Stress: Not on file  Relationships  . Social connections:    Talks on phone: Not on file    Gets together: Not on file    Attends religious service: Not on file    Active member  of club or organization: Not on file    Attends meetings of clubs or organizations: Not on file    Relationship status: Not on file  . Intimate partner violence:    Fear of current or ex partner: Not on file    Emotionally abused: Not on file    Physically abused: Not on file    Forced sexual activity: Not on file  Other Topics Concern  . Not on file  Social History Narrative  . Not on file    Allergies   Augmentin [amoxicillin-pot clavulanate]; Trihexyphenidyl hcl; Vancomycin; Amoxicillin; Penicillins; Piperacillin; Potassium-containing compounds; Sodium acetylsalicylate [aspirin]; Soma [carisoprodol]; and Linezolid  Family history:   Family History  Problem Relation Age of Onset  .  Macular degeneration Mother   . Glaucoma Father     Current Medications:   Prior to Admission medications   Medication Sig Start Date End Date Taking? Authorizing Provider  amitriptyline (ELAVIL) 100 MG tablet Take 100 mg by mouth at bedtime.  12/24/16  Yes [provider]  clonazePAM (KLONOPIN) 2 MG tablet Take 2 mg by mouth at bedtime.  12/24/16  Yes [provider]  fludrocortisone (FLORINEF) 0.1 MG tablet Take 0.1-0.2 mg by mouth See admin instructions. Take 0.2 mg in the morning and 0.1 mg in the evening   Yes [provider]  levothyroxine (SYNTHROID, LEVOTHROID) 100 MCG tablet Take 100 mcg by mouth daily before breakfast.  12/24/16  Yes [provider]  lovastatin (MEVACOR) 40 MG tablet Take 40 mg by mouth at bedtime.  12/24/16  Yes [provider]  omeprazole (PRILOSEC) 40 MG capsule Take 40 mg by mouth daily.  12/24/16  Yes [provider]  Potassium Chloride ER 20 MEQ TBCR Take 20-40 mEq by mouth See admin instructions. Take 40 mg in the morning and 20 mg in the evening 01/19/17  Yes [provider]  rizatriptan (MAXALT) 10 MG tablet Take 10 mg by mouth as needed for migraine.  12/24/16  Yes [provider]    Physical Exam:   Vitals:   03/13/18 1015 03/13/18 1030 03/13/18 1045 03/13/18 1100  BP: 135/69   111/61  Pulse: 69  76 79  Resp: 11 15 15 19   Temp:      TempSrc:      SpO2: 100%  100% 100%  Weight:      Height:         Physical Exam: Blood pressure 111/61, pulse 79, temperature (!) 97.1 F (36.2 C), temperature source Oral, resp. rate 19, height 5\' 6"  (1.676 m), weight 59.9 kg, SpO2 100 %. Gen: No acute distress. Head: Normocephalic, atraumatic. Eyes: Pupils equal, round and reactive to light. Extraocular movements intact.  Sclerae nonicteric. No lid lag. Mouth: Oropharynx reveals mildly dry mucous membranes. Dentition is intact. Neck: Supple, no thyromegaly, no lymphadenopathy, no jugular venous  distention. Chest: Lungs are clear to auscultation with good air movement. No rales, rhonchi or wheezes.  CV: Heart sounds are regular with an S1, S2. No murmurs, rubs, clicks, or gallops.  Abdomen: Soft, mildly tender, nondistended with slightly hyperactive bowel sounds. No hepatosplenomegaly or palpable masses. Extremities: Extremities are without clubbing, or cyanosis. No edema. Pedal pulses 2+.  Skin: Warm and dry. No rashes, lesions or wounds.  Some scarring noted. Neuro: Alert and oriented times 3; grossly nonfocal.  Psych: Insight is good and judgment is appropriate. Mood and affect normal.   Data Review:    Labs: Basic Metabolic Panel: Recent Labs  Lab  03/13/18 0957  NA 140  K 4.1  CL 110  CO2 20*  GLUCOSE 112*  BUN 22  CREATININE 1.57*  CALCIUM 10.7*   Liver Function Tests: Recent Labs  Lab 03/13/18 0957  AST 532*  ALT 167*  ALKPHOS 125  BILITOT 0.8  PROT 7.0  ALBUMIN 3.9   Recent Labs  Lab 03/13/18 0957  LIPASE 798*   CBC: Recent Labs  Lab 03/13/18 0957  WBC 8.7  HGB 12.7  HCT 39.2  MCV 96.8  PLT 240     Radiographic Studies: Dg Chest 2 View  Result Date: 03/13/2018 CLINICAL DATA:  Abdominal pain radiating to the back after eating soup. Status post left lobectomy and pneumonia in 2010. EXAM: CHEST - 2 VIEW COMPARISON:  None. FINDINGS: Normal sized heart. Small amount of pleural and parenchymal density at the left lung base with an appearance most compatible with scarring. Otherwise, clear lungs. Mild thoracic spine degenerative changes. Probable old, healed right humeral neck fracture. IMPRESSION: No acute abnormality. Electronically Signed   By: Claudie Revering M.D.   On: 03/13/2018 10:34    EKG: Independently reviewed.  Sinus rhythm at 81 bpm with a nonspecific intraventricular conduction delay and evidence of LAD/LVH.  No old tracings to compare.   Assessment/Plan:   Principal Problem:   Acute pancreatitis We will admit to inpatient  given the patient's complaints of abdominal pain requiring opioids, serum lipase markedly elevated, and need for further abdominal imaging with ultrasound +/- MRCP.  She has a history of cholecystectomy but has marked elevation of her LFTs in addition to her lipase, suggestive of a common bile duct obstruction/inflammation.  We will continue IV fluids, opioids as needed for pain relief, and bowel rest.  Monitor closely for third spacing.  Active Problems:   Hypercalcemia Hydrate vigorously and watch for saponification and a drop in her calcium.  Hopefully this just represents dehydration.    AKI (acute kidney injury) ([place]) Vigorously hydrate.    Hypothyroidism Continue Synthroid, but order half the normal dose through the IV route.    Hyperlipidemia Hold Mevacor.    Fibromyalgia Supportive care.   Other information:   DVT prophylaxis: Lovenox ordered. Code Status: Full code. Family Communication: No family presently at the bedside. Disposition Plan: Home when pancreatitis resolved and pain controlled with completion of diagnostic testing. Consults called: Gastroenterology. Admission status: Inpatient given that it is likely the patient will need greater than 48 hours of medically appropriate care while in the hospital.  Currently n.p.o. with need for IV fluids, IV opioids, and further diagnostic testing.  Has marked elevation of her lipase associated with abdominal pain.  The medical decision making on this patient was of high complexity and the patient is at high risk for clinical deterioration, therefore this is a level 3 visit.   Margreta Journey Pheobe Sandiford [facility] Pager [phone] Cell: [phone]   If 7PM-7AM, please contact night-coverage [email or web address] Password [facility] 03/13/2018, 12:18 PM

## 2018-03-13 NOTE — ED Provider Notes (Signed)
Medical screening examination/treatment/procedure(s) were conducted as a shared visit with non-physician practitioner(s) and myself.  I personally evaluated the patient during the encounter.  EKG Interpretation  Date/Time:  Tuesday March 13 2018 09:17:56 EST Ventricular Rate:  81 PR Interval:    QRS Duration: 131 QT Interval:  386 QTC Calculation: 448 R Axis:   -51 Text Interpretation:  Sinus rhythm Short PR interval Nonspecific IVCD with LAD LVH with secondary repolarization abnormality Baseline wander in lead(s) I III aVR aVL aVF V1 V2 V3 V4 V5 No old tracing to compare Confirmed by Lacretia Leigh [phone]) on 03/13/2018 9:52:6 AM 72 year old female presents with abdominal discomfort.  Patient has evidence of pancreatitis.  Will be admitted to the hospitalist service   Lacretia Leigh, MD 03/13/18 1222

## 2018-03-13 NOTE — Consult Note (Signed)
[facility] Consult: 11:42 AM 03/13/2018  LOS: 0 days    Referring Provider: Dr Rockne Menghini  Primary Care Physician:  [name], MD Primary Gastroenterologist: Althia Forts.  Previous GI doctor in [place].    Reason for Consultation: Pancreatitis   HPI: Natalie Johnson is a 72 y.o. female.  PMH autoimmune autonomic neuropathy.  Katherina Right syndrome.  Previous cholecystectomy, lung lobectomy for pneumonia in 2010, orthopedic surgeries  For about a year patient has periodically had brief episodes of upper abdominal discomfort that she thought was heartburn, nausea rarely vomits.  She figured it was reflux disease. Last night a few hours after having soup for dinner she developed severe epigastric pain that radiated into her back, into her chest.  She vomited bilious material several times.  She has had watery diarrhea for close to a week.  In the ED Lipase 798.  T bili 0.8, alkaline phosphatase 125, AST/ALT 532/167. WBCs 8.7.  Calcium 10.7, normal albumin. Renal insufficiency currently at stage 3 CKD, baseline renal function unknown.  Patient says when she had her gallbladder surgery it was uncomplicated she did not require ERCP. Patient does not use NSAIDs.  Takes 40 mg Prilosec daily.  Prior to the diarrhea her stools were formed, brown once or twice a day.  Prior to last night her appetite was good.  Has not had any fever or chills.  She does not drink alcohol.  No new medications. Following administration of morphine the pain and nausea has resolved and has not returned.  Regarding the autonomic neuropathy, initially when it presented she would have these sudden episodes of falling, not syncope.  She sustained trauma which required orthopedic surgery and eventually was moving about with a wheelchair as a  precautionary measure.  In the last year she has been able to walk with canes but in the last several weeks she has begun having the more severe symptoms where she falls.  Recent falls she mildly knocked her face and on another episode fell to her knees.  She denies having sustained any trauma to her abdomen.    Past Medical History:  Diagnosis Date  . Ankle fracture   . Autoimmune autonomic neuropathy ([place])   . Stevens-Johnson disease [facility])     Past Surgical History:  Procedure Laterality Date  . ABDOMINAL HYSTERECTOMY    . CATARACT EXTRACTION  2014   OU  . CHOLECYSTECTOMY    . ELBOW FRACTURE SURGERY    . KNEE ARTHROSCOPY    . LUNG LOBECTOMY    . SHOULDER SURGERY    . TONSILLECTOMY      Prior to Admission medications   Medication Sig Start Date End Date Taking? Authorizing Provider  amitriptyline (ELAVIL) 100 MG tablet Take 100 mg by mouth at bedtime.  12/24/16  Yes [provider]  clonazePAM (KLONOPIN) 2 MG tablet Take 2 mg by mouth at bedtime.  12/24/16  Yes [provider]  fludrocortisone (FLORINEF) 0.1 MG tablet Take 0.1-0.2 mg by mouth See admin instructions. Take 0.2 mg in the morning and 0.1 mg in the  evening   Yes [provider]  levothyroxine (SYNTHROID, LEVOTHROID) 100 MCG tablet Take 100 mcg by mouth daily before breakfast.  12/24/16  Yes [provider]  lovastatin (MEVACOR) 40 MG tablet Take 40 mg by mouth at bedtime.  12/24/16  Yes [provider]  omeprazole (PRILOSEC) 40 MG capsule Take 40 mg by mouth daily.  12/24/16  Yes [provider]  Potassium Chloride ER 20 MEQ TBCR Take 20-40 mEq by mouth See admin instructions. Take 40 mg in the morning and 20 mg in the evening 01/19/17  Yes [provider]  rizatriptan (MAXALT) 10 MG tablet Take 10 mg by mouth as needed for migraine.  12/24/16  Yes [provider]    Scheduled Meds: . aflibercept  2 mg Intravitreal   . aflibercept  2 mg Intravitreal     . aflibercept  2 mg Intravitreal   . aflibercept  2 mg Intravitreal   . aflibercept  2 mg Intravitreal   . aflibercept  2 mg Intravitreal   . aflibercept  2 mg Intravitreal   . Bevacizumab  1.25 mg Intravitreal   . Bevacizumab  1.25 mg Intravitreal   . Bevacizumab  1.25 mg Intravitreal   . Bevacizumab  1.25 mg Intravitreal   . Bevacizumab  1.25 mg Intravitreal    Infusions: . sodium chloride 1,000 mL (03/13/18 1130)   PRN Meds:    Allergies as of 03/13/2018 - Review Complete 03/13/2018  Allergen Reaction Noted  . Augmentin [amoxicillin-pot clavulanate] Anaphylaxis 01/31/2017  . Trihexyphenidyl hcl Anaphylaxis 01/31/2017  . Vancomycin Anaphylaxis 01/31/2017  . Amoxicillin Hives 01/31/2017  . Penicillins Hives 01/31/2017  . Piperacillin Hives 01/31/2017  . Potassium-containing compounds  01/31/2017  . Sodium acetylsalicylate [aspirin] Hives 01/31/2017  . Soma [carisoprodol] Hives 01/31/2017  . Linezolid Rash 01/31/2017    Family History  Problem Relation Age of Onset  . Macular degeneration Mother   . Glaucoma Father     Social History   Socioeconomic History  . Marital status: Widowed    Spouse name: Not on file  . Number of children: Not on file  . Years of education: Not on file  . Highest education level: Not on file  Occupational History  . Not on file  Social Needs  . Financial resource strain: Not on file  . Food insecurity:    Worry: Not on file    Inability: Not on file  . Transportation needs:    Medical: Not on file    Non-medical: Not on file  Tobacco Use  . Smoking status: Never Smoker  . Smokeless tobacco: Never Used  Substance and Sexual Activity  . Alcohol use: No    Frequency: Never  . Drug use: No  . Sexual activity: Not on file  Lifestyle  . Physical activity:    Days per week: Not on file    Minutes per session: Not on file  . Stress: Not on file  Relationships  . Social connections:    Talks on phone: Not on file    Gets  together: Not on file    Attends religious service: Not on file    Active member of club or organization: Not on file    Attends meetings of clubs or organizations: Not on file    Relationship status: Not on file  . Intimate partner violence:    Fear of current or ex partner: Not on file    Emotionally abused: Not on file    Physically  abused: Not on file    Forced sexual activity: Not on file  Other Topics Concern  . Not on file  Social History Narrative  . Not on file    REVIEW OF SYSTEMS: Constitutional: Per HPI ENT:  No nose bleeds Pulm: No trouble breathing or cough. CV:  No palpitations, no LE edema.  Chest pain is radiating pain from the abdomen.  This is not angina. GU:  No hematuria, no frequency GI: Per HPI Heme: No excessive or unusual bleeding or bruising. Transfusions: None Neuro: See HPI for recent dropping episodes.  No headaches, no peripheral tingling or numbness Derm:  No itching, no rash or sores.  Endocrine:  No sweats or chills.  No polyuria or dysuria Immunization: Not queried.    PHYSICAL EXAM: Vital signs in last 24 hours: Vitals:   03/13/18 1045 03/13/18 1100  BP:  111/61  Pulse: 76 79  Resp: 15 19  Temp:    SpO2: 100% 100%   Wt Readings from Last 3 Encounters:  03/13/18 59.9 kg    General: Pleasant, well-appearing WF who appears her stated age.  She is comfortable. Head: No facial asymmetry or swelling.  No signs of head trauma. Eyes: No scleral icterus.  No conjunctival pallor.  EOMI. Ears: Not hard of hearing Nose: No discharge Mouth: Good dentition.  Oral mucosa moist, pink, clear.  Tongue midline. Neck: No JVD, no masses, no thyromegaly. Lungs: Clear bilaterally with excellent breath sounds.  No cough, no dyspnea. Heart: RRR.  No MRG.  S1, S2 present Abdomen: Soft.  Mild epigastric tenderness without guarding or rebound.  Active bowel sounds.  No distention.  No masses, bruits, HSM, hernias..   Rectal: Deferred Musc/Skeltl: No  joint redness, swelling, gross deformity. Extremities: No CCE. Neurologic: Oriented x3.  Excellent historian.  Moves all 4 limbs with full strength.  No tremor.  No gross deficits. Skin: No rash, no sores, no suspicious lesions.  A little bit of a hematoma in the left upper eyelid orbit region. Tattoos: None noted. Nodes: No cervical adenopathy Psych: Calm, pleasant, fluid speech.  Intake/Output from previous day: No intake/output data recorded. Intake/Output this shift: No intake/output data recorded.  LAB RESULTS: Recent Labs    03/13/18 0957  WBC 8.7  HGB 12.7  HCT 39.2  PLT 240   BMET Lab Results  Component Value Date   NA 140 03/13/2018   K 4.1 03/13/2018   CL 110 03/13/2018   CO2 20 (L) 03/13/2018   GLUCOSE 112 (H) 03/13/2018   BUN 22 03/13/2018   CREATININE 1.57 (H) 03/13/2018   CALCIUM 10.7 (H) 03/13/2018   LFT Recent Labs    03/13/18 0957  PROT 7.0  ALBUMIN 3.9  AST 532*  ALT 167*  ALKPHOS 125  BILITOT 0.8   Lipase     Component Value Date/Time   LIPASE 798 (H) 03/13/2018 0957     RADIOLOGY STUDIES: Dg Chest 2 View  Result Date: 03/13/2018 CLINICAL DATA:  Abdominal pain radiating to the back after eating soup. Status post left lobectomy and pneumonia in 2010. EXAM: CHEST - 2 VIEW COMPARISON:  None. FINDINGS: Normal sized heart. Small amount of pleural and parenchymal density at the left lung base with an appearance most compatible with scarring. Otherwise, clear lungs. Mild thoracic spine degenerative changes. Probable old, healed right humeral neck fracture. IMPRESSION: No acute abnormality. Electronically Signed   By: Claudie Revering M.D.   On: 03/13/2018 10:34    IMPRESSION:   *  Acute pancreatitis in patient who is several years out from cholecystectomy.  Does not consume alcoholic beverages. Rule out CBD stone/sludge.  autoimmune, medication induced pancreatitis also on the differential list. Symptoms much improved following administration  of Zofran and fentanyl 45 minutes ago Patient does not look toxic or septic. PLAN:     *     Ultrasound abdomen ordered.   May need MRCP and/or CT scan  *   Keep NPO for now but could have clears later if no further acute sxs and all imaging studies completed.    *   Push IVF.  Tried ordering lactated Ringer's but she is apparently had some adverse response to potassium containing medications so ordered normal saline bolus of 500 mL's and then 150/hour for 24 hours   Azucena Freed  03/13/2018, 11:42 AM Phone [phone]

## 2018-03-13 NOTE — ED Triage Notes (Signed)
Pt here from home via [facility] stating abdominal pain that radiates to back after eating soup.  States some diarrhea and 1 episode of emesis.

## 2018-03-13 NOTE — ED Provider Notes (Signed)
[facility] EMERGENCY DEPARTMENT Provider Note   CSN: [id number] Arrival date & time: 03/13/18  7628     History   Chief Complaint Chief Complaint  Patient presents with  . Abdominal Pain    HPI Natalie Johnson is a 72 y.o. female who is a past medical history of autoimmune neuropathy and Stevens-Johnson syndrome secondary to vancomycin use along with previous necrotizing fasc pneumonia requiring lobectomy of the left lung.  Patient presents the emergency department with chief complaint of epigastric abdominal pain.  She is status post cholecystectomy.  Patient states that last night she ate soup and began having pain in her epigastrium.  Pain radiates around the left side of her upper abdomen, into her back and behind her sternum.  She states that it is colicky and at times sharp.  She had nausea and one episode of vomiting without diaphoresis, pain in her jaw or arm.  The patient took omeprazole, Tums without relief of her symptoms.  She did not take any pain medication.  She denies history of coronary artery disease, hypertension, hyperlipidemia, diabetes, family history of CAD or smoking history.  Denies changes in her stool color.  She denies change in her urine or urinary symptoms.  She denies bilious or bloody emesis.  HPI  Past Medical History:  Diagnosis Date  . Ankle fracture   . Autoimmune autonomic neuropathy ([place])   . Neuropathy   . 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[place])     There are no active problems to display for this patient.   Past Surgical History:  Procedure Laterality Date  . ABDOMINAL HYSTERECTOMY    . CATARACT EXTRACTION  2014   OU  . CHOLECYSTECTOMY    . ELBOW FRACTURE SURGERY    . GALLBLADDER SURGERY    . KNEE ARTHROSCOPY    . LUNG LOBECTOMY    . SHOULDER SURGERY    . TONSILLECTOMY       OB History   No obstetric history on file.      Home Medications    Prior to Admission medications   Medication Sig Start Date End Date  Taking? Authorizing Provider  amitriptyline (ELAVIL) 100 MG tablet Take 100 mg by mouth at bedtime.  12/24/16  Yes [provider]  clonazePAM (KLONOPIN) 2 MG tablet Take 2 mg by mouth at bedtime.  12/24/16  Yes [provider]  fludrocortisone (FLORINEF) 0.1 MG tablet Take 0.1-0.2 mg by mouth See admin instructions. Take 0.2 mg in the morning and 0.1 mg in the evening   Yes [provider]  levothyroxine (SYNTHROID, LEVOTHROID) 100 MCG tablet Take 100 mcg by mouth daily before breakfast.  12/24/16  Yes [provider]  lovastatin (MEVACOR) 40 MG tablet Take 40 mg by mouth at bedtime.  12/24/16  Yes [provider]  omeprazole (PRILOSEC) 40 MG capsule Take 40 mg by mouth daily.  12/24/16  Yes [provider]  Potassium Chloride ER 20 MEQ TBCR Take 20-40 mEq by mouth See admin instructions. Take 40 mg in the morning and 20 mg in the evening 01/19/17  Yes [provider]  rizatriptan (MAXALT) 10 MG tablet Take 10 mg by mouth as needed for migraine.  12/24/16  Yes [provider]    Family History Family History  Problem Relation Age of Onset  . Macular degeneration Mother   . Glaucoma Father     Social History Social History   Tobacco Use  . Smoking status: Never Smoker  . Smokeless tobacco:  Never Used  Substance Use Topics  . Alcohol use: No    Frequency: Never  . Drug use: No     Allergies   Augmentin [amoxicillin-pot clavulanate]; Trihexyphenidyl hcl; Vancomycin; Amoxicillin; Penicillins; Piperacillin; Potassium-containing compounds; Sodium acetylsalicylate [aspirin]; Soma [carisoprodol]; and Linezolid   Review of Systems Review of Systems Ten systems reviewed and are negative for acute change, except as noted in the HPI.    Physical Exam Updated Vital Signs BP 130/70   Pulse 66   Temp (!) 97.1 F (36.2 C) (Oral)   Resp 13   Ht 5\' 6"  (1.676 m)   Wt 59.9 kg   SpO2 100%   BMI 21.31 kg/m   Physical  Exam Vitals signs and nursing note reviewed.  Constitutional:      General: She is not in acute distress.    Appearance: She is well-developed. She is not diaphoretic.  HENT:     Head: Normocephalic and atraumatic.  Eyes:     General: No scleral icterus.    Conjunctiva/sclera: Conjunctivae normal.  Neck:     Musculoskeletal: Normal range of motion.  Cardiovascular:     Rate and Rhythm: Normal rate and regular rhythm.     Heart sounds: Normal heart sounds. No murmur. No friction rub. No gallop.   Pulmonary:     Effort: Pulmonary effort is normal. No respiratory distress.     Breath sounds: Normal breath sounds.  Abdominal:     General: Bowel sounds are normal. There is no distension.     Palpations: Abdomen is soft. There is no mass.     Tenderness: There is abdominal tenderness in the epigastric area and left upper quadrant. There is no right CVA tenderness, left CVA tenderness or guarding.  Skin:    General: Skin is warm and dry.  Neurological:     Mental Status: She is alert and oriented to person, place, and time.  Psychiatric:        Behavior: Behavior normal.      ED Treatments / Results  Labs (all labs ordered are listed, but only abnormal results are displayed) Labs Reviewed  COMPREHENSIVE METABOLIC PANEL - Abnormal; Notable for the following components:      Result Value   CO2 20 (*)    Glucose, Bld 112 (*)    Creatinine, Ser 1.57 (*)    Calcium 10.7 (*)    AST 532 (*)    ALT 167 (*)    GFR calc non Af Amer 33 (*)    GFR calc Af Amer 38 (*)    All other components within normal limits  CBC  LIPASE, BLOOD  I-STAT TROPONIN, ED    EKG EKG Interpretation  Date/Time:  Tuesday March 13 2018 09:17:56 EST Ventricular Rate:  81 PR Interval:    QRS Duration: 131 QT Interval:  386 QTC Calculation: 448 R Axis:   -51 Text Interpretation:  Sinus rhythm Short PR interval Nonspecific IVCD with LAD LVH with secondary repolarization abnormality Baseline wander  in lead(s) I III aVR aVL aVF V1 V2 V3 V4 V5 No old tracing to compare Confirmed by Lacretia Leigh ([id number]) on 03/13/2018 9:27:34 AM   Radiology Dg Chest 2 View  Result Date: 03/13/2018 CLINICAL DATA:  Abdominal pain radiating to the back after eating soup. Status post left lobectomy and pneumonia in 2010. EXAM: CHEST - 2 VIEW COMPARISON:  None. FINDINGS: Normal sized heart. Small amount of pleural and parenchymal density at the left lung base with an appearance most  compatible with scarring. Otherwise, clear lungs. Mild thoracic spine degenerative changes. Probable old, healed right humeral neck fracture. IMPRESSION: No acute abnormality. Electronically Signed   By: Claudie Revering M.D.   On: 03/13/2018 10:34    Procedures Procedures (including critical care time)  Medications Ordered in ED Medications  fentaNYL (SUBLIMAZE) injection 50 mcg (has no administration in time range)  ondansetron (ZOFRAN) injection 4 mg (has no administration in time range)     Initial Impression / Assessment and Plan / ED Course  I have reviewed the triage vital signs and the nursing notes.  Pertinent labs & imaging results that were available during my care of the patient were reviewed by me and considered in my medical decision making (see chart for details).  Clinical Course as of Mar 14 1643  Tue Mar 13, 2018  1055 AST(!): 532 [AH]  1055 ALT(!): 167 [AH]  5481 72 year old female epigastric abdominal pain.Differential diagnosis of epigastric pain includes: Functional or nonulcer dyspepsia , PUD, GERD, Gastritis, (NSAIDs, alcohol, stress, H. pylori, pernicious anemia), pancreatitis or pancreatic cancer, overeating indigestion (high-fat foods, coffee), drugs (aspirin, antibiotics (eg, macrolides, metronidazole), corticosteroids, digoxin, narcotics, theophylline), gastroparesis, lactose intolerance, malabsorption gastric cancer, parasitic infection, (Giardia, Strongyloides, Ascaris) cholelithiasis,  choledocholithiasis, or cholangitis, ACS, pericarditis, pneumonia, abdominal hernia, pregnancy, intestinal ischemia, esophageal rupture, gastric volvulus, hepatitis. The patient has a negative chest x-ray which I personally reviewed.  EKG shows no signs of ischemia.  Her troponin is currently pending.  Patient has markedly elevated liver enzymes and given her history of cholecystectomy I have concern for potential choledocholithiasis versus gallstone pancreatitis however I am still awaiting the lipase result.  Patient has not required any pain medication until this point.   [AH]  1128 Patient with acute pancreatitis.  I have spoken with the hospitalist Dr. Rockne Menghini who will admit the patient.  I have also spoken with Nancy Marus, PA-C of [facility] gastroenterology who recommends starting with right upper quadrant ultrasound.  Patient will need admission.  I discussed the findings with the patient who is aware of the diagnosis agrees with admission.   [AH]    Clinical Course User Index [AH] Margarita Mail, PA-C    Final Clinical Impressions(s) / ED Diagnoses   Final diagnoses:    Acute pancreatitis, unspecified complication status, unspecified pancreatitis type  Transaminitis    ED Discharge Orders    None       Margarita Mail, PA-C 03/13/18 1644    Lacretia Leigh, MD 03/14/18 [phone]

## 2018-03-13 NOTE — Progress Notes (Signed)
Bridey Brookover is a 72 y.o. female patient admitted from ED awake, alert - oriented  X 4 - no acute distress noted.  IV in place, occlusive dsg intact without redness.  Orientation to room, and floor completed with information packet given to patient/family.  Patient declined safety video at this time.  Admission INP armband ID verified with patient/family, and in place.   SR up x 2, fall assessment complete, with patient and family able to verbalize understanding of risk associated with falls, and verbalized understanding to call nsg before up out of bed.  Call light within reach, patient able to voice, and demonstrate understanding.  Skin, clean-dry- intact without evidence of bruising, or skin tears.   No evidence of skin break down noted on exam.     Will cont to eval and treat per MD orders.  Tama High, RN 03/13/2018 5:38 PM

## 2018-03-14 DIAGNOSIS — K859 Acute pancreatitis without necrosis or infection, unspecified: Secondary | ICD-10-CM

## 2018-03-14 LAB — COMPREHENSIVE METABOLIC PANEL
ALT: 117 U/L — ABNORMAL HIGH (ref 0–44)
AST: 147 U/L — ABNORMAL HIGH (ref 15–41)
Albumin: 2.9 g/dL — ABNORMAL LOW (ref 3.5–5.0)
Alkaline Phosphatase: 107 U/L (ref 38–126)
Anion gap: 7 (ref 5–15)
BUN: 15 mg/dL (ref 8–23)
CO2: 18 mmol/L — ABNORMAL LOW (ref 22–32)
Calcium: 8.5 mg/dL — ABNORMAL LOW (ref 8.9–10.3)
Chloride: 118 mmol/L — ABNORMAL HIGH (ref 98–111)
Creatinine, Ser: 1.31 mg/dL — ABNORMAL HIGH (ref 0.44–1.00)
GFR calc Af Amer: 47 mL/min — ABNORMAL LOW (ref >60)
GFR calc non Af Amer: 41 mL/min — ABNORMAL LOW (ref >60)
Glucose, Bld: 85 mg/dL (ref 70–99)
Potassium: 3.2 mmol/L — ABNORMAL LOW (ref 3.5–5.1)
Sodium: 143 mmol/L (ref 135–145)
Total Bilirubin: 0.6 mg/dL (ref 0.3–1.2)
Total Protein: 5.2 g/dL — ABNORMAL LOW (ref 6.5–8.1)

## 2018-03-14 LAB — LIPID PANEL
Cholesterol: 120 mg/dL (ref 0–200)
HDL: 42 mg/dL (ref >40)
LDL Cholesterol: 48 mg/dL (ref 0–99)
Total CHOL/HDL Ratio: 2.9 RATIO
Triglycerides: 149 mg/dL (ref <150)
VLDL: 30 mg/dL (ref 0–40)

## 2018-03-14 LAB — MAGNESIUM: Magnesium: 1.7 mg/dL (ref 1.7–2.4)

## 2018-03-14 MED ORDER — SODIUM CHLORIDE 0.9 % IV SOLN
INTRAVENOUS | Status: DC
  Administered 2018-03-14 (×3): via INTRAVENOUS

## 2018-03-14 MED ORDER — LEVOTHYROXINE SODIUM 100 MCG PO TABS
100.0000 ug | ORAL_TABLET | Freq: Every day | ORAL | Status: DC
  Administered 2018-03-15 – 2018-03-18 (×3): 100 ug via ORAL
  Filled 2018-03-14 (×4): qty 1

## 2018-03-14 MED ORDER — POTASSIUM CHLORIDE CRYS ER 20 MEQ PO TBCR
40.0000 meq | EXTENDED_RELEASE_TABLET | Freq: Once | ORAL | Status: AC
  Administered 2018-03-14: 40 meq via ORAL
  Filled 2018-03-14: qty 2

## 2018-03-14 MED ORDER — CLONAZEPAM 1 MG PO TABS
1.0000 mg | ORAL_TABLET | Freq: Every day | ORAL | Status: DC
  Administered 2018-03-14 – 2018-03-17 (×4): 1 mg via ORAL
  Filled 2018-03-14 (×4): qty 1

## 2018-03-14 MED ORDER — PRAVASTATIN SODIUM 40 MG PO TABS
40.0000 mg | ORAL_TABLET | Freq: Every day | ORAL | Status: DC
  Administered 2018-03-14 – 2018-03-17 (×4): 40 mg via ORAL
  Filled 2018-03-14 (×4): qty 1

## 2018-03-14 MED ORDER — PANTOPRAZOLE SODIUM 40 MG PO TBEC
40.0000 mg | DELAYED_RELEASE_TABLET | Freq: Every day | ORAL | Status: DC
  Administered 2018-03-14 – 2018-03-18 (×4): 40 mg via ORAL
  Filled 2018-03-14 (×4): qty 1

## 2018-03-14 NOTE — Progress Notes (Signed)
PROGRESS NOTE                                                                                                                                                                                                             Patient Demographics:    Natalie Johnson, is a 72 y.o. female, DOB - 01/11/1946, IZT:[id number]  Admit date - 03/13/2018   Admitting Physician Venetia Maxon Rama, MD  Outpatient Primary MD for the patient is [name], MD  LOS - 1  Chief Complaint  Patient presents with  . Abdominal Pain       Brief Narrative  Natalie Johnson is an 72 y.o. female with a PMH of autoimmune autonomic neuropathy, Stevens-Johnson syndrome, fibromyalgia, necrotizing lung infection requiring lobectomy, and prior cholecystectomy who presents with a 24-hour history of abdominal pain. She was admitted for Acute Pancreatitis.   Subjective:    Natalie Johnson today has, No headache, No chest pain, resolved abdominal pain - No Nausea, No new weakness tingling or numbness, No Cough - SOB.     Assessment  & Plan :     1. Acute Pancreatitis -unclear etiology, history of cholecystectomy but LFTs did go up, could have had passed biliary sludge in CBD.  Doubt she has a stone as she has remarkably improved with supportive care, discussed with GI Dr. Collene Mares on 03/14/2018.  Continue hydration, placed on clear liquid diet, will check a fasting lipid panel.  Right upper quadrant ultrasound is nonacute.  Continue to monitor closely.  If stable outpatient GI follow-up and discharge in the next 1 to 2 days.  No results found for: CHOL, HDL, LDLCALC, LDLDIRECT, TRIG, CHOLHDL   2.  Renal insufficiency.  Could be ARF due to dehydration however no baseline available in the chart, hydrate and monitor renal function is improving with good urine output.  3.  Hypothyroidism.  On Synthroid.  4.  Dyslipidemia.  Continue home dose statin, check fasting lipid panel.  5.  Fibromyalgia.  Stable supportive  care.  6.  Dehydration induced hypercalcemia.  Monitor with hydration.\  7.  Hypokalemia.  Replaced will monitor.    Family Communication  :  None  Code Status :  Full  Disposition Plan  :  Home in 1-2 days  Consults  :  GI  Procedures  :    RUQ [place]  - No acute process or explanation for bilious emesis.   DVT Prophylaxis  :  Lovenox    Lab Results  Component Value  Date   PLT 240 03/13/2018    Diet :  Diet Order            Diet clear liquid Room service appropriate? Yes; Fluid consistency: Thin  Diet effective now               Inpatient Medications Scheduled Meds: . clonazePAM  2 mg Oral QHS  . enoxaparin (LOVENOX) injection  40 mg Subcutaneous Q24H  . fludrocortisone  0.1 mg Oral QPM  . fludrocortisone  0.2 mg Oral q morning - 10a  . levothyroxine  50 mcg Intravenous Daily  . pantoprazole (PROTONIX) IV  40 mg Intravenous Q24H   Continuous Infusions: . sodium chloride 150 mL/hr at 03/14/18 0816   PRN Meds:.acetaminophen **OR** acetaminophen, morphine injection  Antibiotics  :   Anti-infectives (From admission, onward)   None          Objective:   Vitals:   03/13/18 1426 03/13/18 1448 03/13/18 2111 03/14/18 0524  BP: (!) 151/121 135/62 (!) 143/79 (!) 118/55  Pulse: 82  72 65  Resp: 18  17 18   Temp: 97.7 F (36.5 C)  98.4 F (36.9 C) (!) 97.4 F (36.3 C)  TempSrc: Oral  Oral Oral  SpO2: 97%  96% 97%  Weight:      Height:        Wt Readings from Last 3 Encounters:  03/13/18 59.9 kg     Intake/Output Summary (Last 24 hours) at 03/14/2018 0859 Last data filed at 03/13/2018 1231 Gross per 24 hour  Intake 999.99 ml  Output -  Net 999.99 ml     Physical Exam  Awake Alert, Oriented X 3, No new F.N deficits, Normal affect [place].AT,PERRAL Supple Neck,No JVD, No cervical lymphadenopathy appriciated.  Symmetrical Chest wall movement, Good air movement bilaterally, CTAB RRR,No Gallops,Rubs or new Murmurs, No Parasternal Heave +ve  B.Sounds, Abd Soft, No tenderness, No organomegaly appriciated, No rebound - guarding or rigidity. No Cyanosis, Clubbing or edema, No new Rash or bruise       Data Review:    CBC Recent Labs  Lab 03/13/18 0957  WBC 8.7  HGB 12.7  HCT 39.2  PLT 240  MCV 96.8  MCH 31.4  MCHC 32.4  RDW 13.3    Chemistries  Recent Labs  Lab 03/13/18 0957 03/14/18 0347  NA 140 143  K 4.1 3.2*  CL 110 118*  CO2 20* 18*  GLUCOSE 112* 85  BUN 22 15  CREATININE 1.57* 1.31*  CALCIUM 10.7* 8.5*  AST 532* 147*  ALT 167* 117*  ALKPHOS 125 107  BILITOT 0.8 0.6   ------------------------------------------------------------------------------------------------------------------ No results for input(s): CHOL, HDL, LDLCALC, TRIG, CHOLHDL, LDLDIRECT in the last 72 hours.  No results found for: HGBA1C ------------------------------------------------------------------------------------------------------------------ No results for input(s): TSH, T4TOTAL, T3FREE, THYROIDAB in the last 72 hours.  Invalid input(s): FREET3 ------------------------------------------------------------------------------------------------------------------ No results for input(s): VITAMINB12, FOLATE, FERRITIN, TIBC, IRON, RETICCTPCT in the last 72 hours.  Coagulation profile No results for input(s): INR, PROTIME in the last 168 hours.  No results for input(s): DDIMER in the last 72 hours.  Cardiac Enzymes No results for input(s): CKMB, TROPONINI, MYOGLOBIN in the last 168 hours.  Invalid input(s): CK ------------------------------------------------------------------------------------------------------------------ No results found for: BNP  Micro Results No results found for this or any previous visit (from the past 240 hour(s)).  Radiology Reports Dg Chest 2 View  Result Date: 03/13/2018 CLINICAL DATA:  Abdominal pain radiating to the back after eating soup. Status post left lobectomy and  pneumonia in 2010.  EXAM: CHEST - 2 VIEW COMPARISON:  None. FINDINGS: Normal sized heart. Small amount of pleural and parenchymal density at the left lung base with an appearance most compatible with scarring. Otherwise, clear lungs. Mild thoracic spine degenerative changes. Probable old, healed right humeral neck fracture. IMPRESSION: No acute abnormality. Electronically Signed   By: Claudie Revering M.D.   On: 03/13/2018 10:34   US Abdomen Limited Ruq  Result Date: 03/13/2018 CLINICAL DATA:  Bilious emesis. EXAM: ULTRASOUND ABDOMEN LIMITED RIGHT UPPER QUADRANT COMPARISON:  None. FINDINGS: Gallbladder: Surgically absent Common bile duct: Diameter: Normal, 5 mm. Liver: Normal in echogenicity, without focal lesion. Portal vein is patent on color Doppler imaging with normal direction of blood flow towards the liver. IMPRESSION: No acute process or explanation for bilious emesis. Electronically Signed   By: Abigail Miyamoto M.D.   On: 03/13/2018 13:39    Time Spent in minutes  30   Lala Lund M.D on 03/14/2018 at 8:59 AM  To page go to [email or web address] - password [facility]

## 2018-03-15 DIAGNOSIS — R1114 Bilious vomiting: Secondary | ICD-10-CM

## 2018-03-15 LAB — CBC
HCT: 34.2 % — ABNORMAL LOW (ref 36.0–46.0)
Hemoglobin: 11.4 g/dL — ABNORMAL LOW (ref 12.0–15.0)
MCH: 31.5 pg (ref 26.0–34.0)
MCHC: 33.3 g/dL (ref 30.0–36.0)
MCV: 94.5 fL (ref 80.0–100.0)
Platelets: 201 10*3/uL (ref 150–400)
RBC: 3.62 MIL/uL — ABNORMAL LOW (ref 3.87–5.11)
RDW: 13.2 % (ref 11.5–15.5)
WBC: 5.6 10*3/uL (ref 4.0–10.5)
nRBC: 0 % (ref 0.0–0.2)

## 2018-03-15 LAB — COMPREHENSIVE METABOLIC PANEL
ALT: 76 U/L — ABNORMAL HIGH (ref 0–44)
AST: 61 U/L — ABNORMAL HIGH (ref 15–41)
Albumin: 3.1 g/dL — ABNORMAL LOW (ref 3.5–5.0)
Alkaline Phosphatase: 97 U/L (ref 38–126)
Anion gap: 8 (ref 5–15)
BUN: 7 mg/dL — ABNORMAL LOW (ref 8–23)
CO2: 22 mmol/L (ref 22–32)
Calcium: 8.4 mg/dL — ABNORMAL LOW (ref 8.9–10.3)
Chloride: 114 mmol/L — ABNORMAL HIGH (ref 98–111)
Creatinine, Ser: 1.18 mg/dL — ABNORMAL HIGH (ref 0.44–1.00)
GFR calc Af Amer: 54 mL/min — ABNORMAL LOW (ref >60)
GFR calc non Af Amer: 46 mL/min — ABNORMAL LOW (ref >60)
Glucose, Bld: 83 mg/dL (ref 70–99)
Potassium: 2.6 mmol/L — CL (ref 3.5–5.1)
Sodium: 144 mmol/L (ref 135–145)
Total Bilirubin: 0.9 mg/dL (ref 0.3–1.2)
Total Protein: 5.5 g/dL — ABNORMAL LOW (ref 6.5–8.1)

## 2018-03-15 LAB — LIPASE, BLOOD: Lipase: 43 U/L (ref 11–51)

## 2018-03-15 MED ORDER — PROMETHAZINE HCL 25 MG/ML IJ SOLN
25.0000 mg | Freq: Four times a day (QID) | INTRAMUSCULAR | Status: DC | PRN
  Administered 2018-03-15: 25 mg via INTRAVENOUS

## 2018-03-15 MED ORDER — POTASSIUM CHLORIDE CRYS ER 20 MEQ PO TBCR
40.0000 meq | EXTENDED_RELEASE_TABLET | Freq: Once | ORAL | Status: AC
  Administered 2018-03-15: 40 meq via ORAL
  Filled 2018-03-15: qty 2

## 2018-03-15 MED ORDER — POTASSIUM CHLORIDE 10 MEQ/100ML IV SOLN
10.0000 meq | INTRAVENOUS | Status: AC
  Administered 2018-03-15 (×6): 10 meq via INTRAVENOUS
  Filled 2018-03-15 (×6): qty 100

## 2018-03-15 MED ORDER — POTASSIUM CHLORIDE CRYS ER 20 MEQ PO TBCR
40.0000 meq | EXTENDED_RELEASE_TABLET | Freq: Every day | ORAL | Status: DC
  Administered 2018-03-15: 40 meq via ORAL
  Filled 2018-03-15: qty 2

## 2018-03-15 MED ORDER — LIDOCAINE 5 % EX PTCH
1.0000 | MEDICATED_PATCH | CUTANEOUS | Status: DC
  Administered 2018-03-15 – 2018-03-16 (×2): 1 via TRANSDERMAL
  Filled 2018-03-15 (×3): qty 1

## 2018-03-15 MED ORDER — PROMETHAZINE HCL 25 MG/ML IJ SOLN
12.5000 mg | Freq: Four times a day (QID) | INTRAMUSCULAR | Status: DC | PRN
  Filled 2018-03-15: qty 1

## 2018-03-15 MED ORDER — LACTATED RINGERS IV SOLN
INTRAVENOUS | Status: DC
  Administered 2018-03-15 – 2018-03-16 (×3): via INTRAVENOUS

## 2018-03-15 MED ORDER — ONDANSETRON HCL 4 MG/2ML IJ SOLN
4.0000 mg | INTRAMUSCULAR | Status: DC | PRN
  Administered 2018-03-15: 4 mg via INTRAVENOUS
  Filled 2018-03-15: qty 2

## 2018-03-15 MED ORDER — LOPERAMIDE HCL 2 MG PO CAPS
2.0000 mg | ORAL_CAPSULE | Freq: Four times a day (QID) | ORAL | Status: DC | PRN
  Administered 2018-03-16 – 2018-03-17 (×3): 2 mg via ORAL
  Filled 2018-03-15 (×4): qty 1

## 2018-03-15 MED ORDER — MAGNESIUM OXIDE 400 (241.3 MG) MG PO TABS
800.0000 mg | ORAL_TABLET | Freq: Every day | ORAL | Status: DC
  Administered 2018-03-15: 800 mg via ORAL
  Filled 2018-03-15: qty 2

## 2018-03-15 MED ORDER — LOPERAMIDE HCL 2 MG PO CAPS
4.0000 mg | ORAL_CAPSULE | Freq: Once | ORAL | Status: AC
  Administered 2018-03-15: 4 mg via ORAL
  Filled 2018-03-15: qty 2

## 2018-03-15 NOTE — Progress Notes (Signed)
Daily Rounding Note  03/15/2018, 1:24 PM  LOS: 2 days   SUBJECTIVE:   Chief complaint: acute idiopathic pancreatitis.    Hip pain now.  Abdomen tender to palpation in lower left where she is getting Lovenox shots.  Nausea and limited emesis after morphine today (for hip pain)  No nausea now.   Stools persistently loose pre and during admission.  Imodium given at 0750 this AM.   Getting IV potassium for hypokalemia.    OBJECTIVE:         Vital signs in last 24 hours:    Temp:  [97.6 F (36.4 C)-98.2 F (36.8 C)] 98.2 F (36.8 C) (12/26 0533) Pulse Rate:  [58-83] 58 (12/26 0533) Resp:  [18-19] 19 (12/25 2203) BP: (128-143)/(64-74) 128/65 (12/26 0533) SpO2:  [96 %-99 %] 96 % (12/26 0533) Last BM Date: 03/14/18 Filed Weights   03/13/18 0921  Weight: 59.9 kg   General: looks tired and chronically ill.  Alert, comfortable   Heart: RRR Chest: clear bil.   Abdomen: soft, ND.  Not tender.  Mild bruise in LLQ.    Extremities: no CCE Neuro/Psych:  Oriented x 3.  Moves all 4s.    Intake/Output from previous day: No intake/output data recorded.  Intake/Output this shift: No intake/output data recorded.  Lab Results: Recent Labs    03/13/18 0957 03/15/18 0355  WBC 8.7 5.6  HGB 12.7 11.4*  HCT 39.2 34.2*  PLT 240 201   BMET Recent Labs    03/13/18 0957 03/14/18 0347 03/15/18 0355  NA 140 143 144  K 4.1 3.2* 2.6*  CL 110 118* 114*  CO2 20* 18* 22  GLUCOSE 112* 85 83  BUN 22 15 7*  CREATININE 1.57* 1.31* 1.18*  CALCIUM 10.7* 8.5* 8.4*   LFT Recent Labs    03/13/18 0957 03/14/18 0347 03/15/18 0355  PROT 7.0 5.2* 5.5*  ALBUMIN 3.9 2.9* 3.1*  AST 532* 147* 61*  ALT 167* 117* 76*  ALKPHOS 125 107 97  BILITOT 0.8 0.6 0.9   PT/INR No results for input(s): LABPROT, INR in the last 72 hours. Hepatitis Panel No results for input(s): HEPBSAG, HCVAB, HEPAIGM, HEPBIGM in the last 72  hours.  Studies/Results: US Abdomen Limited Ruq  Result Date: 03/13/2018 CLINICAL DATA:  Bilious emesis. EXAM: ULTRASOUND ABDOMEN LIMITED RIGHT UPPER QUADRANT COMPARISON:  None. FINDINGS: Gallbladder: Surgically absent Common bile duct: Diameter: Normal, 5 mm. Liver: Normal in echogenicity, without focal lesion. Portal vein is patent on color Doppler imaging with normal direction of blood flow towards the liver. IMPRESSION: No acute process or explanation for bilious emesis. Electronically Signed   By: Abigail Miyamoto M.D.   On: 03/13/2018 13:39    ASSESMENT:   *   Acute idiopathic pancreatitis.  Does not drink ETOH.  GB removed many years ago.  Abdominal ultrasound 12/24: 5 mm CBD, normal liver, normal dopplers.   Lipase 798 >> 43.   transaminases steadily improved.   Pt was taking MD sanctioned weight loss supplements until about 1 month before onset of pancreatitis sxs (upper abd pain, watery diarrhea, bilious emesis) .   Overall better but still not well.   *   AKI improved  *    Hip pain.  Added K Pad.  Added prn tylenol (had renal insufficiency from aleve in past) attempting to avoid narcotics.    *   Hypokalemia, runs KCL in place.     PLAN   *  K Pad, tylenol prn  *    CMET in AM  *   Full liquids.      Azucena Freed  03/15/2018, 1:24 PM Phone [phone]

## 2018-03-15 NOTE — Progress Notes (Signed)
PROGRESS NOTE                                                                                                                                                                                                             Patient Demographics:    Natalie Johnson, is a 72 y.o. female, DOB - 1945-07-01, CVE:[id number]  Admit date - 03/13/2018   Admitting Physician Venetia Maxon Rama, MD  Outpatient Primary MD for the patient is [name], MD  LOS - 2  Chief Complaint  Patient presents with  . Abdominal Pain       Brief Narrative  Natalie Johnson is an 72 y.o. female with a PMH of autoimmune autonomic neuropathy, Stevens-Johnson syndrome, fibromyalgia, necrotizing lung infection requiring lobectomy, and prior cholecystectomy who presents with a 24-hour history of abdominal pain. She was admitted for Acute Pancreatitis.   Subjective:    Natalie Johnson today has, No headache, No chest pain, resolved abdominal pain - No Nausea, No new weakness tingling or numbness, No Cough - SOB.     Assessment  & Plan :     1. Acute Pancreatitis -unclear etiology, history of cholecystectomy but LFTs did go up, could have had passed biliary stone/sludge in CBD.  Symptoms have almost completely resolved, she is almost completely pain-free and tolerating liquid diet, case was discussed with GI physician on-call Dr. Collene Mares on 03/14/2018.  She agreed with conservative management and no further work-up.  Right upper quadrant ultrasound noted to be unremarkable, triglycerides stable.  Patient much improved.  Will advance to soft diet.  If stable discharge tomorrow with outpatient GI follow-up.  Continue gentle hydration.   Lab Results  Component Value Date   CHOL 120 03/14/2018   HDL 42 03/14/2018   LDLCALC 48 03/14/2018   TRIG 149 03/14/2018   CHOLHDL 2.9 03/14/2018    2. ARF.  Dehydration caused by #1 above resolved with IV fluids.  3.  Hypothyroidism.  On Synthroid.  4.  Dyslipidemia.   Continue home dose statin, check fasting lipid panel.  5.  Fibromyalgia.  Stable supportive care.  6.  Dehydration induced hypercalcemia.  Resolved with hydration, continue gentle IV fluids.  7.  Severe Hypokalemia.  Replaced both IV and PO.  8.  Ongoing diarrhea patient states present for the last 3 to 4 days after she had a soup at a family get together, no leukocytosis no fever.  Imodium as needed and monitor.    Family Communication  :  None  Code Status :  Full  Disposition Plan  :  Home in 1-2 days  Consults  :  GI  Procedures  :    RUQ [place]  - No acute process or explanation for bilious emesis.   DVT Prophylaxis  :  Lovenox    Lab Results  Component Value Date   PLT 201 03/15/2018    Diet :  Diet Order            Diet clear liquid Room service appropriate? Yes; Fluid consistency: Thin  Diet effective now               Inpatient Medications Scheduled Meds: . clonazePAM  1 mg Oral QHS  . enoxaparin (LOVENOX) injection  40 mg Subcutaneous Q24H  . fludrocortisone  0.1 mg Oral QPM  . fludrocortisone  0.2 mg Oral q morning - 10a  . levothyroxine  100 mcg Oral QAC breakfast  . magnesium oxide  800 mg Oral Daily  . pantoprazole  40 mg Oral Daily  . potassium chloride  40 mEq Oral Daily  . pravastatin  40 mg Oral q1800   Continuous Infusions: . sodium chloride 100 mL/hr at 03/15/18 0753  . potassium chloride 10 mEq (03/15/18 0800)   PRN Meds:.acetaminophen **OR** [DISCONTINUED] acetaminophen, loperamide, morphine injection  Antibiotics  :   Anti-infectives (From admission, onward)   None          Objective:   Vitals:   03/14/18 0524 03/14/18 1518 03/14/18 2203 03/15/18 0533  BP: (!) 118/55 (!) 142/64 (!) 143/74 128/65  Pulse: 65 72 83 (!) 58  Resp: 18 18 19    Temp: (!) 97.4 F (36.3 C) 97.6 F (36.4 C) 97.9 F (36.6 C) 98.2 F (36.8 C)  TempSrc: Oral Oral  Oral  SpO2: 97% 99% 97% 96%  Weight:      Height:        Wt Readings from Last 3  Encounters:  03/13/18 59.9 kg    No intake or output data in the 24 hours ending 03/15/18 0816   Physical Exam  Awake Alert, Oriented X 3, No new F.N deficits, Normal affect [place].AT,PERRAL Supple Neck,No JVD, No cervical lymphadenopathy appriciated.  Symmetrical Chest wall movement, Good air movement bilaterally, CTAB RRR,No Gallops, Rubs or new Murmurs, No Parasternal Heave +ve B.Sounds, Abd Soft, No tenderness, No organomegaly appriciated, No rebound - guarding or rigidity. No Cyanosis, Clubbing or edema, No new Rash or bruise    Data Review:    CBC Recent Labs  Lab 03/13/18 0957 03/15/18 0355  WBC 8.7 5.6  HGB 12.7 11.4*  HCT 39.2 34.2*  PLT 240 201  MCV 96.8 94.5  MCH 31.4 31.5  MCHC 32.4 33.3  RDW 13.3 13.2    Chemistries  Recent Labs  Lab 03/13/18 0957 03/14/18 0347 03/15/18 0355  NA 140 143 144  K 4.1 3.2* 2.6*  CL 110 118* 114*  CO2 20* 18* 22  GLUCOSE 112* 85 83  BUN 22 15 7*  CREATININE 1.57* 1.31* 1.18*  CALCIUM 10.7* 8.5* 8.4*  MG  --  1.7  --   AST 532* 147* 61*  ALT 167* 117* 76*  ALKPHOS 125 107 97  BILITOT 0.8 0.6 0.9   ------------------------------------------------------------------------------------------------------------------ Recent Labs    03/14/18 0347  CHOL 120  HDL 42  LDLCALC 48  TRIG 149  CHOLHDL 2.9    No results found for: HGBA1C ------------------------------------------------------------------------------------------------------------------ No results for input(s): TSH, T4TOTAL, T3FREE, THYROIDAB in the last 72 hours.  Invalid input(s): FREET3 ------------------------------------------------------------------------------------------------------------------ No results for input(s): VITAMINB12, FOLATE, FERRITIN, TIBC, IRON, RETICCTPCT in the last 72 hours.  Coagulation profile No results for input(s): INR, PROTIME in the last 168 hours.  No results for input(s): DDIMER in the last 72 hours.  Cardiac  Enzymes No results for input(s): CKMB, TROPONINI, MYOGLOBIN in the last 168 hours.  Invalid input(s): CK ------------------------------------------------------------------------------------------------------------------ No results found for: BNP  Micro Results No results found for this or any previous visit (from the past 240 hour(s)).  Radiology Reports Dg Chest 2 View  Result Date: 03/13/2018 CLINICAL DATA:  Abdominal pain radiating to the back after eating soup. Status post left lobectomy and pneumonia in 2010. EXAM: CHEST - 2 VIEW COMPARISON:  None. FINDINGS: Normal sized heart. Small amount of pleural and parenchymal density at the left lung base with an appearance most compatible with scarring. Otherwise, clear lungs. Mild thoracic spine degenerative changes. Probable old, healed right humeral neck fracture. IMPRESSION: No acute abnormality. Electronically Signed   By: Claudie Revering M.D.   On: 03/13/2018 10:34   US Abdomen Limited Ruq  Result Date: 03/13/2018 CLINICAL DATA:  Bilious emesis. EXAM: ULTRASOUND ABDOMEN LIMITED RIGHT UPPER QUADRANT COMPARISON:  None. FINDINGS: Gallbladder: Surgically absent Common bile duct: Diameter: Normal, 5 mm. Liver: Normal in echogenicity, without focal lesion. Portal vein is patent on color Doppler imaging with normal direction of blood flow towards the liver. IMPRESSION: No acute process or explanation for bilious emesis. Electronically Signed   By: Abigail Miyamoto M.D.   On: 03/13/2018 13:39    Time Spent in minutes  30   Lala Lund M.D on 03/15/2018 at 8:16 AM  To page go to [email or web address] - password [email or web address]

## 2018-03-16 ENCOUNTER — Inpatient Hospital Stay (HOSPITAL_COMMUNITY): Payer: Medicare HMO

## 2018-03-16 DIAGNOSIS — R740 Nonspecific elevation of levels of transaminase and lactic acid dehydrogenase [LDH]: Secondary | ICD-10-CM

## 2018-03-16 DIAGNOSIS — R7401 Elevation of levels of liver transaminase levels: Secondary | ICD-10-CM

## 2018-03-16 LAB — COMPREHENSIVE METABOLIC PANEL
ALT: 55 U/L — ABNORMAL HIGH (ref 0–44)
AST: 38 U/L (ref 15–41)
Albumin: 3.2 g/dL — ABNORMAL LOW (ref 3.5–5.0)
Alkaline Phosphatase: 93 U/L (ref 38–126)
Anion gap: 10 (ref 5–15)
BUN: <5 mg/dL — ABNORMAL LOW (ref 8–23)
CO2: 26 mmol/L (ref 22–32)
Calcium: 8.7 mg/dL — ABNORMAL LOW (ref 8.9–10.3)
Chloride: 107 mmol/L (ref 98–111)
Creatinine, Ser: 1.03 mg/dL — ABNORMAL HIGH (ref 0.44–1.00)
GFR calc Af Amer: >60 mL/min (ref >60)
GFR calc non Af Amer: 55 mL/min — ABNORMAL LOW (ref >60)
Glucose, Bld: 84 mg/dL (ref 70–99)
Potassium: 3.1 mmol/L — ABNORMAL LOW (ref 3.5–5.1)
Sodium: 143 mmol/L (ref 135–145)
Total Bilirubin: 0.7 mg/dL (ref 0.3–1.2)
Total Protein: 5.8 g/dL — ABNORMAL LOW (ref 6.5–8.1)

## 2018-03-16 LAB — CBC
HCT: 33.9 % — ABNORMAL LOW (ref 36.0–46.0)
Hemoglobin: 11.8 g/dL — ABNORMAL LOW (ref 12.0–15.0)
MCH: 32.7 pg (ref 26.0–34.0)
MCHC: 34.8 g/dL (ref 30.0–36.0)
MCV: 93.9 fL (ref 80.0–100.0)
Platelets: 206 10*3/uL (ref 150–400)
RBC: 3.61 MIL/uL — ABNORMAL LOW (ref 3.87–5.11)
RDW: 13 % (ref 11.5–15.5)
WBC: 6.2 10*3/uL (ref 4.0–10.5)
nRBC: 0 % (ref 0.0–0.2)

## 2018-03-16 MED ORDER — LACTATED RINGERS IV SOLN
INTRAVENOUS | Status: AC
  Administered 2018-03-17: 04:00:00 via INTRAVENOUS

## 2018-03-16 MED ORDER — POTASSIUM CHLORIDE CRYS ER 20 MEQ PO TBCR
40.0000 meq | EXTENDED_RELEASE_TABLET | Freq: Every day | ORAL | Status: DC
  Filled 2018-03-16: qty 2

## 2018-03-16 MED ORDER — IOHEXOL 300 MG/ML  SOLN
100.0000 mL | Freq: Once | INTRAMUSCULAR | Status: AC | PRN
  Administered 2018-03-16: 100 mL via INTRAVENOUS

## 2018-03-16 NOTE — Progress Notes (Addendum)
Daily Rounding Note  03/16/2018, 10:52 AM  LOS: 3 days   SUBJECTIVE:   Chief complaint: acute pancreatitis.      Hip pain is better.  She had 3 episodes of nausea/vomiting yesterday only 1 of these was associated with with morphine administration.  She was made n.p.o. and has not had anything by mouth this morning.  She is not nauseous.  She is hungry.  OBJECTIVE:         Vital signs in last 24 hours:    Temp:  [98.6 F (37 C)-98.8 F (37.1 C)] 98.8 F (37.1 C) (12/27 0553) Pulse Rate:  [71-74] 71 (12/27 0553) Resp:  [18] 18 (12/27 0553) BP: (152-169)/(73-85) 152/73 (12/27 0553) SpO2:  [92 %-99 %] 99 % (12/27 0553) Last BM Date: 03/15/18 Filed Weights   03/13/18 0921  Weight: 59.9 kg   General: Looks a bit tired.  Not toxic. Heart: RRR. Chest: Clear bilaterally no labored breathing. Abdomen: Soft.  Non-tender.  Active BS.  ND Extremities: no CCE Neuro/Psych: Alert.  Oriented x3.  Calm, pleasant.  Moves all 4 limbs.  Assisted her with wrapping up extension cord, lashing it to the IV pole and watching her walk out of the room down the hall which she does without difficulty.  Intake/Output from previous day: 12/26 0701 - 12/27 0700 In: 1283.1 [I.V.:732.9; IV Piggyback:550.2] Out: -   Intake/Output this shift: Total I/O In: 1506.3 [I.V.:1506.3] Out: -   Lab Results: Recent Labs    03/15/18 0355 03/16/18 0314  WBC 5.6 6.2  HGB 11.4* 11.8*  HCT 34.2* 33.9*  PLT 201 206   BMET Recent Labs    03/14/18 0347 03/15/18 0355 03/16/18 0314  NA 143 144 143  K 3.2* 2.6* 3.1*  CL 118* 114* 107  CO2 18* 22 26  GLUCOSE 85 83 84  BUN 15 7* <5*  CREATININE 1.31* 1.18* 1.03*  CALCIUM 8.5* 8.4* 8.7*   LFT Recent Labs    03/14/18 0347 03/15/18 0355 03/16/18 0314  PROT 5.2* 5.5* 5.8*  ALBUMIN 2.9* 3.1* 3.2*  AST 147* 61* 38  ALT 117* 76* 55*  ALKPHOS 107 97 93  BILITOT 0.6 0.9 0.7     Studies/Results: Dg Hips Bilat With Pelvis Min 5 Views  Result Date: 03/16/2018 CLINICAL DATA:  Pain in both hips for of L1 year, left more than right EXAM: DG HIP (WITH OR WITHOUT PELVIS) 5+V BILAT COMPARISON:  None. FINDINGS: The hip joints appear very normal for age with very little degenerative joint disease. The pelvic rami are intact. The SI joints appear corticated. The lower lumbar spine is unremarkable. IMPRESSION: Both hips appear normal. No significant degenerative change is seen. Electronically Signed   By: Ivar Drape M.D.   On: 03/16/2018 08:39    ASSESMENT:   *   Acute idiopathic, chemical pancreatitis.  Does not drink ETOH.  GB removed many years ago.  Abdominal ultrasound 12/24: 5 mm CBD, normal liver, normal dopplers.   Lipase 798 >> 43.   transaminases steadily improved.   Pt was taking MD sanctioned weight loss supplements until about 1 month before onset of pancreatitis sxs (upper abd pain, watery diarrhea, bilious emesis) .   Overall better but still not well.   *   AKI improved  *    Hip pain.  Added K Pad, prn tylenol (hxrenal insufficiency from aleve).     *   Hypokalemia.  Improved following runs of  potassium yesterday but persists.  Oral potassium ordered.     PLAN   *    Restart clears, advance to low-fat diet if clears tolerated.    Azucena Freed  03/16/2018, 10:52 AM Phone [phone]

## 2018-03-16 NOTE — Care Management Important Message (Signed)
Important Message  Patient Details  Name: Natalie Johnson MRN: [id number] Date of Birth: 1946-02-11   Medicare Important Message Given:  Yes    Barb Merino Jazelyn Sipe 03/16/2018, 4:51 PM

## 2018-03-16 NOTE — Progress Notes (Signed)
PROGRESS NOTE                                                                                                                                                                                                             Patient Demographics:    Natalie Johnson, is a 72 y.o. female, DOB - 19-Jun-1945, [id number]  Admit date - 03/13/2018   Admitting Physician Venetia Maxon Rama, MD  Outpatient Primary MD for the patient is [name], MD  LOS - 3  Chief Complaint  Patient presents with  . Abdominal Pain       Brief Narrative  Natalie Johnson is an 72 y.o. female with a PMH of autoimmune autonomic neuropathy, Stevens-Johnson syndrome, fibromyalgia, necrotizing lung infection requiring lobectomy, and prior cholecystectomy who presents with a 24-hour history of abdominal pain. She was admitted for Acute Pancreatitis.   Subjective:   Patient in bed, appears comfortable, denies any headache, no fever, no chest pain or pressure, no shortness of breath , no abdominal pain, does have low back pain along with nausea vomiting and ongoing diarrhea. No focal weakness.   Assessment  & Plan :     1. Acute Pancreatitis -unclear etiology, history of cholecystectomy but LFTs did go up, could have had passed biliary stone/sludge in CBD.  Triglycerides stable, right upper quadrant ultrasound stable, symptoms have almost completely resolved, she is almost completely pain-free and tolerating liquid diet, GI following.  All stable from pancreatitis standpoint, continue gentle hydration and monitor, now main issues are ongoing diarrhea and nausea vomiting for which CT scan abdomen pelvis will be done on 03/16/2018 along with supportive care.   Lab Results  Component Value Date   CHOL 120 03/14/2018   HDL 42 03/14/2018   LDLCALC 48 03/14/2018   TRIG 149 03/14/2018   CHOLHDL 2.9 03/14/2018    2. ARF.  Dehydration caused by #1 above resolved with IV fluids.  3.  Hypothyroidism.  On  Synthroid.  4.  Dyslipidemia.  Continue home dose statin, check fasting lipid panel.  5.  Fibromyalgia.  Stable supportive care.  6.  Dehydration induced hypercalcemia.  Resolved with hydration, continue gentle IV fluids.  7.  Severe Hypokalemia.  Replaced both IV and PO.  8.  Ongoing diarrhea with nausea vomiting.  Reason unclear.  CT scan abdomen pelvis ordered on 03/16/2018.  Continue supportive care with Imodium and Zofran.  9.  Low back pain.  No fall.  Likely chronic, x-ray stable.    Family Communication  :  None  Code Status :  Full  Disposition Plan  :  Home in 1-2 days  Consults  :  GI  Procedures  :    RUQ [place]  - No acute process or explanation for bilious emesis.   DVT Prophylaxis  :  Lovenox    Lab Results  Component Value Date   PLT 206 03/16/2018    Diet :  Diet Order            Diet NPO time specified  Diet effective now               Inpatient Medications Scheduled Meds: . clonazePAM  1 mg Oral QHS  . enoxaparin (LOVENOX) injection  40 mg Subcutaneous Q24H  . fludrocortisone  0.1 mg Oral QPM  . fludrocortisone  0.2 mg Oral q morning - 10a  . levothyroxine  100 mcg Oral QAC breakfast  . lidocaine  1 patch Transdermal Q24H  . magnesium oxide  800 mg Oral Daily  . pantoprazole  40 mg Oral Daily  . [START ON 03/17/2018] potassium chloride  40 mEq Oral Daily  . pravastatin  40 mg Oral q1800   Continuous Infusions: . lactated ringers 100 mL/hr at 03/16/18 0759   PRN Meds:.acetaminophen **OR** [DISCONTINUED] acetaminophen, loperamide, morphine injection, ondansetron (ZOFRAN) IV, promethazine  Antibiotics  :   Anti-infectives (From admission, onward)   None          Objective:   Vitals:   03/14/18 2203 03/15/18 0533 03/15/18 2100 03/16/18 0553  BP: (!) 143/74 128/65 (!) 169/85 (!) 152/73  Pulse: 83 (!) 58 74 71  Resp: 19  18 18   Temp: 97.9 F (36.6 C) 98.2 F (36.8 C) 98.6 F (37 C) 98.8 F (37.1 C)  TempSrc:  Oral Oral Oral    SpO2: 97% 96% 92% 99%  Weight:      Height:        Wt Readings from Last 3 Encounters:  03/13/18 59.9 kg     Intake/Output Summary (Last 24 hours) at 03/16/2018 1054 Last data filed at 03/16/2018 1000 Gross per 24 hour  Intake 2789.37 ml  Output -  Net 2789.37 ml     Physical Exam  Awake Alert, Oriented X 3, No new F.N deficits, Normal affect [place].AT,PERRAL Supple Neck,No JVD, No cervical lymphadenopathy appriciated.  Symmetrical Chest wall movement, Good air movement bilaterally, CTAB RRR,No Gallops, Rubs or new Murmurs, No Parasternal Heave +ve B.Sounds, Abd Soft, No tenderness, No organomegaly appriciated, No rebound - guarding or rigidity. No Cyanosis, Clubbing or edema, No new Rash or bruise    Data Review:    CBC Recent Labs  Lab 03/13/18 0957 03/15/18 0355 03/16/18 0314  WBC 8.7 5.6 6.2  HGB 12.7 11.4* 11.8*  HCT 39.2 34.2* 33.9*  PLT 240 201 206  MCV 96.8 94.5 93.9  MCH 31.4 31.5 32.7  MCHC 32.4 33.3 34.8  RDW 13.3 13.2 13.0    Chemistries  Recent Labs  Lab 03/13/18 0957 03/14/18 0347 03/15/18 0355 03/16/18 0314  NA 140 143 144 143  K 4.1 3.2* 2.6* 3.1*  CL 110 118* 114* 107  CO2 20* 18* 22 26  GLUCOSE 112* 85 83 84  BUN 22 15 7* <5*  CREATININE 1.57* 1.31* 1.18* 1.03*  CALCIUM 10.7* 8.5* 8.4* 8.7*  MG  --  1.7  --   --   AST 532* 147* 61* 38  ALT 167* 117* 76* 55*  ALKPHOS 125 107 97 93  BILITOT 0.8  0.6 0.9 0.7   ------------------------------------------------------------------------------------------------------------------ Recent Labs    03/14/18 0347  CHOL 120  HDL 42  LDLCALC 48  TRIG 149  CHOLHDL 2.9    No results found for: HGBA1C ------------------------------------------------------------------------------------------------------------------ No results for input(s): TSH, T4TOTAL, T3FREE, THYROIDAB in the last 72 hours.  Invalid input(s):  FREET3 ------------------------------------------------------------------------------------------------------------------ No results for input(s): VITAMINB12, FOLATE, FERRITIN, TIBC, IRON, RETICCTPCT in the last 72 hours.  Coagulation profile No results for input(s): INR, PROTIME in the last 168 hours.  No results for input(s): DDIMER in the last 72 hours.  Cardiac Enzymes No results for input(s): CKMB, TROPONINI, MYOGLOBIN in the last 168 hours.  Invalid input(s): CK ------------------------------------------------------------------------------------------------------------------ No results found for: BNP  Micro Results No results found for this or any previous visit (from the past 240 hour(s)).  Radiology Reports Dg Chest 2 View  Result Date: 03/13/2018 CLINICAL DATA:  Abdominal pain radiating to the back after eating soup. Status post left lobectomy and pneumonia in 2010. EXAM: CHEST - 2 VIEW COMPARISON:  None. FINDINGS: Normal sized heart. Small amount of pleural and parenchymal density at the left lung base with an appearance most compatible with scarring. Otherwise, clear lungs. Mild thoracic spine degenerative changes. Probable old, healed right humeral neck fracture. IMPRESSION: No acute abnormality. Electronically Signed   By: Claudie Revering M.D.   On: 03/13/2018 10:34   Dg Hips Bilat With Pelvis Min 5 Views  Result Date: 03/16/2018 CLINICAL DATA:  Pain in both hips for of L1 year, left more than right EXAM: DG HIP (WITH OR WITHOUT PELVIS) 5+V BILAT COMPARISON:  None. FINDINGS: The hip joints appear very normal for age with very little degenerative joint disease. The pelvic rami are intact. The SI joints appear corticated. The lower lumbar spine is unremarkable. IMPRESSION: Both hips appear normal. No significant degenerative change is seen. Electronically Signed   By: Ivar Drape M.D.   On: 03/16/2018 08:39   US Abdomen Limited Ruq  Result Date: 03/13/2018 CLINICAL DATA:   Bilious emesis. EXAM: ULTRASOUND ABDOMEN LIMITED RIGHT UPPER QUADRANT COMPARISON:  None. FINDINGS: Gallbladder: Surgically absent Common bile duct: Diameter: Normal, 5 mm. Liver: Normal in echogenicity, without focal lesion. Portal vein is patent on color Doppler imaging with normal direction of blood flow towards the liver. IMPRESSION: No acute process or explanation for bilious emesis. Electronically Signed   By: Abigail Miyamoto M.D.   On: 03/13/2018 13:39    Time Spent in minutes  30   Lala Lund M.D on 03/16/2018 at 10:54 AM  To page go to [email or web address] - password [facility]

## 2018-03-17 LAB — CBC
HCT: 32.7 % — ABNORMAL LOW (ref 36.0–46.0)
Hemoglobin: 11.2 g/dL — ABNORMAL LOW (ref 12.0–15.0)
MCH: 31.5 pg (ref 26.0–34.0)
MCHC: 34.3 g/dL (ref 30.0–36.0)
MCV: 92.1 fL (ref 80.0–100.0)
Platelets: 192 10*3/uL (ref 150–400)
RBC: 3.55 MIL/uL — ABNORMAL LOW (ref 3.87–5.11)
RDW: 12.7 % (ref 11.5–15.5)
WBC: 5.9 10*3/uL (ref 4.0–10.5)
nRBC: 0 % (ref 0.0–0.2)

## 2018-03-17 LAB — COMPREHENSIVE METABOLIC PANEL
ALT: 41 U/L (ref 0–44)
AST: 26 U/L (ref 15–41)
Albumin: 3.1 g/dL — ABNORMAL LOW (ref 3.5–5.0)
Alkaline Phosphatase: 87 U/L (ref 38–126)
Anion gap: 11 (ref 5–15)
BUN: <5 mg/dL — ABNORMAL LOW (ref 8–23)
CO2: 28 mmol/L (ref 22–32)
Calcium: 8.5 mg/dL — ABNORMAL LOW (ref 8.9–10.3)
Chloride: 104 mmol/L (ref 98–111)
Creatinine, Ser: 1.01 mg/dL — ABNORMAL HIGH (ref 0.44–1.00)
GFR calc Af Amer: >60 mL/min (ref >60)
GFR calc non Af Amer: 56 mL/min — ABNORMAL LOW (ref >60)
Glucose, Bld: 91 mg/dL (ref 70–99)
Potassium: 2.4 mmol/L — CL (ref 3.5–5.1)
Sodium: 143 mmol/L (ref 135–145)
Total Bilirubin: 0.2 mg/dL — ABNORMAL LOW (ref 0.3–1.2)
Total Protein: 5.5 g/dL — ABNORMAL LOW (ref 6.5–8.1)

## 2018-03-17 LAB — POTASSIUM: Potassium: 3.2 mmol/L — ABNORMAL LOW (ref 3.5–5.1)

## 2018-03-17 MED ORDER — POTASSIUM CHLORIDE 10 MEQ/100ML IV SOLN
10.0000 meq | INTRAVENOUS | Status: AC
  Administered 2018-03-17 (×4): 10 meq via INTRAVENOUS
  Filled 2018-03-17 (×4): qty 100

## 2018-03-17 MED ORDER — MAGNESIUM SULFATE IN D5W 1-5 GM/100ML-% IV SOLN
1.0000 g | Freq: Once | INTRAVENOUS | Status: AC
  Administered 2018-03-17: 1 g via INTRAVENOUS
  Filled 2018-03-17: qty 100

## 2018-03-17 MED ORDER — LOPERAMIDE HCL 2 MG PO CAPS
4.0000 mg | ORAL_CAPSULE | Freq: Once | ORAL | Status: AC
  Administered 2018-03-17: 4 mg via ORAL

## 2018-03-17 MED ORDER — POTASSIUM CHLORIDE CRYS ER 20 MEQ PO TBCR
40.0000 meq | EXTENDED_RELEASE_TABLET | Freq: Two times a day (BID) | ORAL | Status: DC
  Administered 2018-03-17 – 2018-03-18 (×3): 40 meq via ORAL
  Filled 2018-03-17 (×3): qty 2

## 2018-03-17 MED ORDER — POTASSIUM CHLORIDE 10 MEQ/100ML IV SOLN
10.0000 meq | INTRAVENOUS | Status: AC
  Administered 2018-03-17 (×3): 10 meq via INTRAVENOUS
  Filled 2018-03-17 (×3): qty 100

## 2018-03-17 MED ORDER — HYDRALAZINE HCL 20 MG/ML IJ SOLN
5.0000 mg | Freq: Four times a day (QID) | INTRAMUSCULAR | Status: DC | PRN
  Administered 2018-03-17: 5 mg via INTRAVENOUS
  Filled 2018-03-17: qty 1

## 2018-03-17 MED ORDER — LACTATED RINGERS IV SOLN
INTRAVENOUS | Status: AC
  Administered 2018-03-17: 10:00:00 via INTRAVENOUS

## 2018-03-17 NOTE — Progress Notes (Signed)
CRITICAL VALUE ALERT  Critical Value:  Potassium 2.4  Date & Time Notied:  03/17/18  Provider Notified: Kennon Holter, NP  Orders Received/Actions taken: New orders for IV potassium placed

## 2018-03-17 NOTE — Progress Notes (Signed)
PROGRESS NOTE                                                                                                                                                                                                             Patient Demographics:    Natalie Johnson, is a 72 y.o. female, DOB - [birth year], [id number]  Admit date - 03/13/2018   Admitting Physician Venetia Maxon Rama, MD  Outpatient Primary MD for the patient is [name], MD  LOS - 4  Chief Complaint  Patient presents with  . Abdominal Pain       Brief Narrative  Natalie Johnson is an 72 y.o. female with a PMH of autoimmune autonomic neuropathy, Stevens-Johnson syndrome, fibromyalgia, necrotizing lung infection requiring lobectomy, and prior cholecystectomy who presents with a 24-hour history of abdominal pain. She was admitted for Acute Pancreatitis.   Subjective:   Patient in bed, appears comfortable, denies any headache, no fever, no chest pain or pressure, no shortness of breath , no abdominal pain. No focal weakness. Improved diarrhea, nausea and vomiting.   Assessment  & Plan :     1. Acute Pancreatitis -unclear etiology, history of cholecystectomy but LFTs did go up, could have had passed biliary stone/sludge in CBD.  Triglycerides stable, right upper quadrant ultrasound stable, symptoms have almost completely resolved, she is almost completely pain-free and tolerating liquid diet, GI following.  All stable from pancreatitis standpoint, continue gentle hydration and monitor, now main issues are ongoing diarrhea and nausea vomiting for which CT scan abdomen pelvis will be done on 03/16/2018 along with supportive care.   Lab Results  Component Value Date   CHOL 120 03/14/2018   HDL 42 03/14/2018   LDLCALC 48 03/14/2018   TRIG 149 03/14/2018   CHOLHDL 2.9 03/14/2018    2. ARF.  Dehydration caused by #1 above resolved with IV fluids.  3.  Hypothyroidism.  On Synthroid.  4.  Dyslipidemia.   Continue home dose statin, check fasting lipid panel.  5.  Fibromyalgia.  Stable supportive care.  6.  Dehydration induced hypercalcemia.  Resolved with hydration, continue gentle IV fluids.  7.  Severe Hypokalemia.  Aggressively replace p.o. and IV, check magnesium also.  8.  Ongoing diarrhea with nausea vomiting.  Reason unclear.  CAT scan abdomen pelvis completely unremarkable, with supportive care symptoms have considerably improved.  9.  Low back pain.  No fall.  Likely chronic, x-ray stable.  Pain much better with supportive care.    Family  Communication  :  None  Code Status :  Full  Disposition Plan  :  Home in 1-2 days  Consults  :  GI  Procedures  :    RUQ [place]  - No acute process or explanation for bilious emesis.   DVT Prophylaxis  :  Lovenox    Lab Results  Component Value Date   PLT 192 03/17/2018    Diet :  Diet Order            Diet clear liquid Room service appropriate? Yes; Fluid consistency: Thin  Diet effective now               Inpatient Medications Scheduled Meds: . clonazePAM  1 mg Oral QHS  . enoxaparin (LOVENOX) injection  40 mg Subcutaneous Q24H  . fludrocortisone  0.1 mg Oral QPM  . fludrocortisone  0.2 mg Oral q morning - 10a  . levothyroxine  100 mcg Oral QAC breakfast  . lidocaine  1 patch Transdermal Q24H  . pantoprazole  40 mg Oral Daily  . potassium chloride  40 mEq Oral BID  . pravastatin  40 mg Oral q1800   Continuous Infusions: . lactated ringers    . magnesium sulfate 1 - 4 g bolus IVPB    . potassium chloride 10 mEq (03/17/18 0812)  . potassium chloride     PRN Meds:.acetaminophen **OR** [DISCONTINUED] acetaminophen, loperamide, morphine injection, ondansetron (ZOFRAN) IV, promethazine  Antibiotics  :   Anti-infectives (From admission, onward)   None          Objective:   Vitals:   03/16/18 0553 03/16/18 2140 03/17/18 0059 03/17/18 0538  BP: (!) 152/73 (!) 146/69  (!) 145/82  Pulse: 71 (!) 58  71  Resp:  18 12  16   Temp: 98.8 F (37.1 C) 99.1 F (37.3 C) 99.4 F (37.4 C) 98.2 F (36.8 C)  TempSrc: Oral Oral Oral Oral  SpO2: 99% 97%  98%  Weight:      Height:        Wt Readings from Last 3 Encounters:  03/13/18 59.9 kg     Intake/Output Summary (Last 24 hours) at 03/17/2018 0902 Last data filed at 03/16/2018 1000 Gross per 24 hour  Intake 1506.28 ml  Output -  Net 1506.28 ml     Physical Exam  Awake Alert, Oriented X 3, No new F.N deficits, Normal affect [place].AT,PERRAL Supple Neck,No JVD, No cervical lymphadenopathy appriciated.  Symmetrical Chest wall movement, Good air movement bilaterally, CTAB RRR,No Gallops, Rubs or new Murmurs, No Parasternal Heave +ve B.Sounds, Abd Soft, No tenderness, No organomegaly appriciated, No rebound - guarding or rigidity. No Cyanosis, Clubbing or edema, No new Rash or bruise     Data Review:    CBC Recent Labs  Lab 03/13/18 0957 03/15/18 0355 03/16/18 0314 03/17/18 0342  WBC 8.7 5.6 6.2 5.9  HGB 12.7 11.4* 11.8* 11.2*  HCT 39.2 34.2* 33.9* 32.7*  PLT 240 201 206 192  MCV 96.8 94.5 93.9 92.1  MCH 31.4 31.5 32.7 31.5  MCHC 32.4 33.3 34.8 34.3  RDW 13.3 13.2 13.0 12.7    Chemistries  Recent Labs  Lab 03/13/18 0957 03/14/18 0347 03/15/18 0355 03/16/18 0314 03/17/18 0342  NA 140 143 144 143 143  K 4.1 3.2* 2.6* 3.1* 2.4*  CL 110 118* 114* 107 104  CO2 20* 18* 22 26 28   GLUCOSE 112* 85 83 84 91  BUN 22 15 7* <5* <5*  CREATININE 1.57* 1.31* 1.18* 1.03*  1.01*  CALCIUM 10.7* 8.5* 8.4* 8.7* 8.5*  MG  --  1.7  --   --   --   AST 532* 147* 61* 38 26  ALT 167* 117* 76* 55* 41  ALKPHOS 125 107 97 93 87  BILITOT 0.8 0.6 0.9 0.7 0.2*   ------------------------------------------------------------------------------------------------------------------ No results for input(s): CHOL, HDL, LDLCALC, TRIG, CHOLHDL, LDLDIRECT in the last 72 hours.  No results found for:  HGBA1C ------------------------------------------------------------------------------------------------------------------ No results for input(s): TSH, T4TOTAL, T3FREE, THYROIDAB in the last 72 hours.  Invalid input(s): FREET3 ------------------------------------------------------------------------------------------------------------------ No results for input(s): VITAMINB12, FOLATE, FERRITIN, TIBC, IRON, RETICCTPCT in the last 72 hours.  Coagulation profile No results for input(s): INR, PROTIME in the last 168 hours.  No results for input(s): DDIMER in the last 72 hours.  Cardiac Enzymes No results for input(s): CKMB, TROPONINI, MYOGLOBIN in the last 168 hours.  Invalid input(s): CK ------------------------------------------------------------------------------------------------------------------ No results found for: BNP  Micro Results No results found for this or any previous visit (from the past 240 hour(s)).  Radiology Reports Dg Chest 2 View  Result Date: 03/13/2018 CLINICAL DATA:  Abdominal pain radiating to the back after eating soup. Status post left lobectomy and pneumonia in 2010. EXAM: CHEST - 2 VIEW COMPARISON:  None. FINDINGS: Normal sized heart. Small amount of pleural and parenchymal density at the left lung base with an appearance most compatible with scarring. Otherwise, clear lungs. Mild thoracic spine degenerative changes. Probable old, healed right humeral neck fracture. IMPRESSION: No acute abnormality. Electronically Signed   By: Claudie Revering M.D.   On: 03/13/2018 10:34   Ct Abdomen Pelvis W Contrast  Result Date: 03/16/2018 CLINICAL DATA:  Acute generalized abdominal pain. EXAM: CT ABDOMEN AND PELVIS WITH CONTRAST TECHNIQUE: Multidetector CT imaging of the abdomen and pelvis was performed using the standard protocol following bolus administration of intravenous contrast. CONTRAST:  145mL OMNIPAQUE IOHEXOL 300 MG/ML  SOLN COMPARISON:  None. FINDINGS: Lower chest:  No acute abnormality. Hepatobiliary: No focal liver abnormality is seen. Status post cholecystectomy. No biliary dilatation. Pancreas: Unremarkable. No pancreatic ductal dilatation or surrounding inflammatory changes. Spleen: Normal in size without focal abnormality. Adrenals/Urinary Tract: Adrenal glands appear normal. Bilateral renal cortical scarring is noted. No hydronephrosis or renal obstruction is noted. No renal or ureteral calculi are noted. Urinary bladder is unremarkable. Stomach/Bowel: The stomach appears normal. There is no evidence of bowel obstruction or inflammation. Status post appendectomy. Vascular/Lymphatic: No significant vascular findings are present. No enlarged abdominal or pelvic lymph nodes. Reproductive: Status post hysterectomy. No adnexal masses. Other: No abdominal wall hernia or abnormality. No abdominopelvic ascites. Musculoskeletal: No acute or significant osseous findings. IMPRESSION: No acute abnormality seen in the abdomen or pelvis. Electronically Signed   By: Marijo Conception, M.D.   On: 03/16/2018 15:25   Dg Hips Bilat With Pelvis Min 5 Views  Result Date: 03/16/2018 CLINICAL DATA:  Pain in both hips for of L1 year, left more than right EXAM: DG HIP (WITH OR WITHOUT PELVIS) 5+V BILAT COMPARISON:  None. FINDINGS: The hip joints appear very normal for age with very little degenerative joint disease. The pelvic rami are intact. The SI joints appear corticated. The lower lumbar spine is unremarkable. IMPRESSION: Both hips appear normal. No significant degenerative change is seen. Electronically Signed   By: Ivar Drape M.D.   On: 03/16/2018 08:39   US Abdomen Limited Ruq  Result Date: 03/13/2018 CLINICAL DATA:  Bilious emesis. EXAM: ULTRASOUND ABDOMEN LIMITED RIGHT UPPER QUADRANT COMPARISON:  None. FINDINGS:  Gallbladder: Surgically absent Common bile duct: Diameter: Normal, 5 mm. Liver: Normal in echogenicity, without focal lesion. Portal vein is patent on color Doppler  imaging with normal direction of blood flow towards the liver. IMPRESSION: No acute process or explanation for bilious emesis. Electronically Signed   By: Abigail Miyamoto M.D.   On: 03/13/2018 13:39    Time Spent in minutes  30   Lala Lund M.D on 03/17/2018 at 9:02 AM  To page go to [email or web address] - password [facility]

## 2018-03-17 NOTE — Evaluation (Signed)
Physical Therapy Evaluation Patient Details Name: Natalie Johnson MRN: [id number] DOB: 27-Aug-1945 Today's Date: 03/17/2018   History of Present Illness  72 y.o. female with a PMH of autoimmune autonomic neuropathy, Stevens-Johnson syndrome, fibromyalgia, necrotizing lung infection requiring lobectomy, and prior cholecystectomy who presents with a 24-hour history of abdominal pain. She was admitted for Acute Pancreatitis.  Clinical Impression  Patient seen for therapy assessment.  Mobilizing well with no noted focal deficits at this time. No further acute PT needs. Will sign off. Denies dizziness or faint feelings at this time.   Follow Up Recommendations No PT follow up    Equipment Recommendations  None recommended by PT    Recommendations for Other Services       Precautions / Restrictions Precautions Precautions: Fall Restrictions Weight Bearing Restrictions: No      Mobility  Bed Mobility Overal bed mobility: Independent                Transfers Overall transfer level: Independent Equipment used: None                Ambulation/Gait Ambulation/Gait assistance: Independent Gait Distance (Feet): 340 Feet Assistive device: None Gait Pattern/deviations: WFL(Within Functional Limits)   Gait velocity interpretation: >2.62 ft/sec, indicative of community ambulatory General Gait Details: patient steady with ambulation and higher level balance tasks  Stairs            Wheelchair Mobility    Modified Rankin (Stroke Patients Only)       Balance Overall balance assessment: Independent                           High level balance activites: Side stepping;Backward walking;Direction changes;Turns;Sudden stops;Head turns High Level Balance Comments: no difficulty with higher level tasks Standardized Balance Assessment Standardized Balance Assessment : Dynamic Gait Index   Dynamic Gait Index Level Surface: Normal Change in Gait Speed:  Normal Gait with Horizontal Head Turns: Normal Gait with Vertical Head Turns: Normal Gait and Pivot Turn: Normal Step Over Obstacle: Normal Step Around Obstacles: Normal Steps: Severe Impairment(deferred at this time) Total Score: 21       Pertinent Vitals/Pain Pain Assessment: 0-10 Pain Score: 3  Pain Location: generalized discomfort Pain Descriptors / Indicators: Discomfort    Home Living Family/patient expects to be discharged to:: Private residence Living Arrangements: Alone Available Help at Discharge: Family Type of Home: House Home Access: Level entry     Home Layout: One level Home Equipment: Grab bars - tub/shower;Grab bars - toilet;Shower seat;Wheelchair - [employer] - 2 wheels;Crutches      Prior Function Level of Independence: Independent with assistive device(s)               Hand Dominance   Dominant Hand: Right    Extremity/Trunk Assessment   Upper Extremity Assessment Upper Extremity Assessment: Overall WFL for tasks assessed    Lower Extremity Assessment Lower Extremity Assessment: Overall WFL for tasks assessed(history of peripheral Neuropathy bottom of feet bilaterally)       Communication      Cognition Arousal/Alertness: Awake/alert Behavior During Therapy: WFL for tasks assessed/performed Overall Cognitive Status: Within Functional Limits for tasks assessed                                        General Comments      Exercises     Assessment/Plan  PT Assessment Patent does not need any further PT services  PT Problem List         PT Treatment Interventions      PT Goals (Current goals can be found in the Care Plan section)  Acute Rehab PT Goals PT Goal Formulation: All assessment and education complete, DC therapy    Frequency     Barriers to discharge        Co-evaluation               AM-PAC PT "6 Clicks" Mobility  Outcome Measure Help needed turning from your back to your side  while in a flat bed without using bedrails?: None Help needed moving from lying on your back to sitting on the side of a flat bed without using bedrails?: None Help needed moving to and from a bed to a chair (including a wheelchair)?: None Help needed standing up from a chair using your arms (e.g., wheelchair or bedside chair)?: None Help needed to walk in hospital room?: None Help needed climbing 3-5 steps with a railing? : A Little 6 Click Score: 23    End of Session Equipment Utilized During Treatment: Gait belt Activity Tolerance: Patient tolerated treatment well Patient left: with call bell/phone within reach;in bed Nurse Communication: Mobility status PT Visit Diagnosis: Difficulty in walking, not elsewhere classified (R26.2)    Time: 2585-2778 PT Time Calculation (min) (ACUTE ONLY): 17 min   Charges:   PT Evaluation $PT Eval Low Complexity: [place], PT DPT  Board Certified Neurologic Specialist Acute Rehabilitation Services Pager [phone] Office [phone]   Duncan Dull 03/17/2018, 12:11 PM

## 2018-03-17 NOTE — Progress Notes (Signed)
[facility] Subjective: Natalie Johnson is a 72 year old white female with a history of autoimmune autonomic neuropathy Katherina Right syndrome fibromyalgia Necrotizing lung infection requiring a lobectomy and a prior cholecystectomy was admitted for acute pancreatitis. Her abdominal pain is completely resolved and she denies having any nausea vomiting however she continues to have 2-3-4 soft and sometimes loose bowel movements per day. There's no history of melena or hematochezia.    Objective: Vital signs in last 24 hours: Temp:  [98.2 F (36.8 C)-99.4 F (37.4 C)] 98.2 F (36.8 C) (12/28 0538) Pulse Rate:  [58-71] 71 (12/28 0538) Resp:  [12-16] 16 (12/28 0538) BP: (145-146)/(69-82) 145/82 (12/28 0538) SpO2:  [97 %-98 %] 98 % (12/28 0538) Last BM Date: 03/15/18  Intake/Output from previous day: 12/27 0701 - 12/28 0700 In: 1506.3 [I.V.:1506.3] Out: -  Intake/Output this shift: No intake/output data recorded.  General appearance: alert, cooperative, appears stated age, fatigued and no distress Resp: clear to auscultation bilaterally Cardio: regular rate and rhythm, S1, S2 normal, no murmur, click, rub or gallop GI: soft, non-tender; bowel sounds normal; no masses,  no organomegaly Extremities: extremities normal, atraumatic, no cyanosis or edema  Lab Results: Recent Labs    03/15/18 0355 03/16/18 0314 03/17/18 0342  WBC 5.6 6.2 5.9  HGB 11.4* 11.8* 11.2*  HCT 34.2* 33.9* 32.7*  PLT 201 206 192   BMET Recent Labs    03/15/18 0355 03/16/18 0314 03/17/18 0342  NA 144 143 143  K 2.6* 3.1* 2.4*  CL 114* 107 104  CO2 22 26 28   GLUCOSE 83 84 91  BUN 7* <5* <5*  CREATININE 1.18* 1.03* 1.01*  CALCIUM 8.4* 8.7* 8.5*   LFT Recent Labs    03/17/18 0342  PROT 5.5*  ALBUMIN 3.1*  AST 26  ALT 41  ALKPHOS 87  BILITOT 0.2*   PT/INR No results for input(s): LABPROT, INR in the last 72 hours. Hepatitis Panel No results for input(s): HEPBSAG, HCVAB,  HEPAIGM, HEPBIGM in the last 72 hours. C-Diff No results for input(s): CDIFFTOX in the last 72 hours. No results for input(s): CDIFFPCR in the last 72 hours. Fecal Lactopherrin No results for input(s): FECLLACTOFRN in the last 72 hours.  Studies/Results: Ct Abdomen Pelvis W Contrast  Result Date: 03/16/2018 CLINICAL DATA:  Acute generalized abdominal pain. EXAM: CT ABDOMEN AND PELVIS WITH CONTRAST TECHNIQUE: Multidetector CT imaging of the abdomen and pelvis was performed using the standard protocol following bolus administration of intravenous contrast. CONTRAST:  173mL OMNIPAQUE IOHEXOL 300 MG/ML  SOLN COMPARISON:  None. FINDINGS: Lower chest: No acute abnormality. Hepatobiliary: No focal liver abnormality is seen. Status post cholecystectomy. No biliary dilatation. Pancreas: Unremarkable. No pancreatic ductal dilatation or surrounding inflammatory changes. Spleen: Normal in size without focal abnormality. Adrenals/Urinary Tract: Adrenal glands appear normal. Bilateral renal cortical scarring is noted. No hydronephrosis or renal obstruction is noted. No renal or ureteral calculi are noted. Urinary bladder is unremarkable. Stomach/Bowel: The stomach appears normal. There is no evidence of bowel obstruction or inflammation. Status post appendectomy. Vascular/Lymphatic: No significant vascular findings are present. No enlarged abdominal or pelvic lymph nodes. Reproductive: Status post hysterectomy. No adnexal masses. Other: No abdominal wall hernia or abnormality. No abdominopelvic ascites. Musculoskeletal: No acute or significant osseous findings. IMPRESSION: No acute abnormality seen in the abdomen or pelvis. Electronically Signed   By: Marijo Conception, M.D.   On: 03/16/2018 15:25   Dg Hips Bilat With Pelvis Min 5 Views  Result Date: 03/16/2018 CLINICAL  DATA:  Pain in both hips for of L1 year, left more than right EXAM: DG HIP (WITH OR WITHOUT PELVIS) 5+V BILAT COMPARISON:  None. FINDINGS: The hip  joints appear very normal for age with very little degenerative joint disease. The pelvic rami are intact. The SI joints appear corticated. The lower lumbar spine is unremarkable. IMPRESSION: Both hips appear normal. No significant degenerative change is seen. Electronically Signed   By: Ivar Drape M.D.   On: 03/16/2018 08:39    Medications: I have reviewed the patient's current medications.  Assessment/Plan: 1) Acute idiopathic pancreatitis that seems to have resolved from completely; her her LFTs had gone up initially which indicated she may have passed a stone.  2) Acute renal failure with secondary dehydration. 3) Severe hypokalemia improved with IV and PO replacement. 4) Diarrhea-will need stool studies if this does not improve. Patient should not be given any artificial sweeteners as these can worsen her diarrhea. CT scan of the abdomen and pelvis done yesterday was essentially normal..  LOS: 4 days   Tien Aispuro 03/17/2018, 9:06 AM

## 2018-03-17 NOTE — Progress Notes (Signed)
Patient BP 170/80.  No PRN meds.  Notified Jeannette Corpus, NP.  Will continue to monitor the patient.

## 2018-03-18 LAB — COMPREHENSIVE METABOLIC PANEL
ALT: 42 U/L (ref 0–44)
AST: 29 U/L (ref 15–41)
Albumin: 3.5 g/dL (ref 3.5–5.0)
Alkaline Phosphatase: 86 U/L (ref 38–126)
Anion gap: 13 (ref 5–15)
BUN: <5 mg/dL — ABNORMAL LOW (ref 8–23)
CO2: 26 mmol/L (ref 22–32)
Calcium: 9.1 mg/dL (ref 8.9–10.3)
Chloride: 105 mmol/L (ref 98–111)
Creatinine, Ser: 1.08 mg/dL — ABNORMAL HIGH (ref 0.44–1.00)
GFR calc Af Amer: 60 mL/min — ABNORMAL LOW (ref >60)
GFR calc non Af Amer: 52 mL/min — ABNORMAL LOW (ref >60)
Glucose, Bld: 76 mg/dL (ref 70–99)
Potassium: 3.2 mmol/L — ABNORMAL LOW (ref 3.5–5.1)
Sodium: 144 mmol/L (ref 135–145)
Total Bilirubin: 0.6 mg/dL (ref 0.3–1.2)
Total Protein: 6.4 g/dL — ABNORMAL LOW (ref 6.5–8.1)

## 2018-03-18 LAB — MAGNESIUM: Magnesium: 1.7 mg/dL (ref 1.7–2.4)

## 2018-03-18 MED ORDER — POTASSIUM CHLORIDE 10 MEQ/100ML IV SOLN
10.0000 meq | INTRAVENOUS | Status: AC
  Administered 2018-03-18 (×3): 10 meq via INTRAVENOUS
  Filled 2018-03-18 (×3): qty 100

## 2018-03-18 NOTE — Discharge Summary (Signed)
Natalie Johnson FAO:[id number] DOB: Jul 25, 1945 DOA: 03/13/2018  PCP: [name], MD  Admit date: 03/13/2018  Discharge date: 03/18/2018  Admitted From: Home   Disposition:  Home   Recommendations for Outpatient Follow-up:   Follow up with PCP in 1-2 weeks  PCP Please obtain BMP/CBC, 2 view CXR in 1week,  (see Discharge instructions)   PCP Please follow up on the following pending results:    Home Health: None  Equipment/Devices: None Consultations: IR, GI Discharge Condition: Satble   CODE STATUS: Full   Diet Recommendation: Heart Healthy      Chief Complaint  Patient presents with  . Abdominal Pain     Brief history of present illness from the day of admission and additional interim summary    Natalie McCartyis an 72 y.o.femalewith a PMH of autoimmune autonomic neuropathy, Stevens-Johnson syndrome, fibromyalgia, necrotizing lung infection requiring lobectomy, and prior cholecystectomy who presents with a 24-hour history of abdominal pain. She was admitted for Acute Pancreatitis.                                                                 Hospital Course     1. Acute Pancreatitis -unclear etiology, history of cholecystectomy but LFTs did go up, could have had passed biliary stone/sludge in CBD.  Triglycerides stable, right upper quadrant ultrasound stable, treated with supportive care with bowel rest and IV fluids all symptoms and pain have resolved, she is symptom-free will be discharged home with outpatient PCP and GI follow-up.  2. ARF.  Dehydration caused by #1 above resolved with IV fluids.  3.  Hypothyroidism.  On Synthroid.  4.  Dyslipidemia.  Continue home dose statin, check fasting lipid panel.  5.  Fibromyalgia.  Stable supportive care.  6.  Dehydration induced  hypercalcemia.  Resolved with hydration, continue gentle IV fluids.  7.  Severe Hypokalemia.  Aggressively replace p.o. and IV, now stable, oral potassium supplementation given again this morning along with IV, continue home potassium supplementation, of interest patient takes about 60 mg of potassium every day at home which is rather unusual, she also is on Florinef, likely has some adrenal issues underlying will have her follow with endocrine outpatient.  8.  Ongoing diarrhea with nausea vomiting.  Reason unclear.  CT scan abdomen pelvis completely unremarkable, with supportive care symptoms have considerably improved.  Discharge with outpatient GI and PCP follow-up.  9.  Low back pain.  No fall.  Likely chronic, x-ray stable.  Pain much better with supportive care.     Discharge diagnosis     Principal Problem:   Acute pancreatitis Active Problems:   Hypercalcemia   AKI (acute kidney injury) ([place])   Hypothyroidism   Hyperlipidemia   Fibromyalgia   Transaminitis    Discharge instructions    Discharge Instructions  Diet - low sodium heart healthy   Complete by:  As directed    Discharge instructions   Complete by:  As directed    Follow with Primary MD [name], MD in 2-3 days   Get CBC, CMP, 2 view Chest X ray -  checked  by Primary MD in 2days   Activity: As tolerated with Full fall precautions use walker/cane & assistance as needed  Disposition Home    Diet: Heart Healthy    Special Instructions: If you have smoked or chewed Tobacco  in the last 2 yrs please stop smoking, stop any regular Alcohol  and or any Recreational drug use.  On your next visit with your primary care physician please Get Medicines reviewed and adjusted.  Please request your Prim.MD to go over all Hospital Tests and Procedure/Radiological results at the follow up, please get all Hospital records sent to your Prim MD by signing hospital release before you go home.  If you  experience worsening of your admission symptoms, develop shortness of breath, life threatening emergency, suicidal or homicidal thoughts you must seek medical attention immediately by calling 911 or calling your MD immediately  if symptoms less severe.  You Must read complete instructions/literature along with all the possible adverse reactions/side effects for all the Medicines you take and that have been prescribed to you. Take any new Medicines after you have completely understood and accpet all the possible adverse reactions/side effects.   Increase activity slowly   Complete by:  As directed       Discharge Medications   Allergies as of 03/18/2018      Reactions   Augmentin [amoxicillin-pot Clavulanate] Anaphylaxis   Trihexyphenidyl Hcl Anaphylaxis   Vancomycin Anaphylaxis   Amoxicillin Hives   Penicillins Hives   DID THE REACTION INVOLVE: Swelling of the face/tongue/throat, SOB, or low BP? Unknown Sudden or severe rash/hives, skin peeling, or the inside of the mouth or nose? Unknown Did it require medical treatment? Unknown When did it last happen?2010 If all above answers are "NO", may proceed with cephalosporin use.   Piperacillin Hives   Sodium Acetylsalicylate [aspirin] Hives   Soma [carisoprodol] Hives   Linezolid Rash      Medication List    TAKE these medications   amitriptyline 100 MG tablet Commonly known as:  ELAVIL Take 100 mg by mouth at bedtime.   clonazePAM 2 MG tablet Commonly known as:  KLONOPIN Take 2 mg by mouth at bedtime.   fludrocortisone 0.1 MG tablet Commonly known as:  FLORINEF Take 0.1-0.2 mg by mouth See admin instructions. Take 0.2 mg in the morning and 0.1 mg in the evening   levothyroxine 100 MCG tablet Commonly known as:  SYNTHROID, LEVOTHROID Take 100 mcg by mouth daily before breakfast.   lovastatin 40 MG tablet Commonly known as:  MEVACOR Take 40 mg by mouth at bedtime.   omeprazole 40 MG capsule Commonly known as:   PRILOSEC Take 40 mg by mouth daily.   Potassium Chloride ER 20 MEQ Tbcr Take 20-40 mEq by mouth See admin instructions. Take 40 mg in the morning and 20 mg in the evening   rizatriptan 10 MG tablet Commonly known as:  MAXALT Take 10 mg by mouth as needed for migraine.       Follow-up Information    [name], MD. Schedule an appointment as soon as possible for a visit in 2 day(s).   Specialty:  Internal Medicine Contact information: [address] [place] [address]  [phone]        Renato Shin, MD. Schedule an appointment as soon as possible for a visit in 1 week(s).   Specialty:  Endocrinology Why:  Persistent hypokalemia Contact information: [address] [place] [address] [phone]        Thornton Park, MD. Schedule an appointment as soon as possible for a visit in 1 week(s).   Specialty:  Gastroenterology Contact information: [place] [address] [phone]           Major procedures and Radiology Reports - PLEASE review detailed and final reports thoroughly  -        Dg Chest 2 View  Result Date: 03/13/2018 CLINICAL DATA:  Abdominal pain radiating to the back after eating soup. Status post left lobectomy and pneumonia in 2010. EXAM: CHEST - 2 VIEW COMPARISON:  None. FINDINGS: Normal sized heart. Small amount of pleural and parenchymal density at the left lung base with an appearance most compatible with scarring. Otherwise, clear lungs. Mild thoracic spine degenerative changes. Probable old, healed right humeral neck fracture. IMPRESSION: No acute abnormality. Electronically Signed   By: Claudie Revering M.D.   On: 03/13/2018 10:34   Ct Abdomen Pelvis W Contrast  Result Date: 03/16/2018 CLINICAL DATA:  Acute generalized abdominal pain. EXAM: CT ABDOMEN AND PELVIS WITH CONTRAST TECHNIQUE: Multidetector CT imaging of the abdomen and pelvis was performed using the standard protocol following bolus  administration of intravenous contrast. CONTRAST:  169mL OMNIPAQUE IOHEXOL 300 MG/ML  SOLN COMPARISON:  None. FINDINGS: Lower chest: No acute abnormality. Hepatobiliary: No focal liver abnormality is seen. Status post cholecystectomy. No biliary dilatation. Pancreas: Unremarkable. No pancreatic ductal dilatation or surrounding inflammatory changes. Spleen: Normal in size without focal abnormality. Adrenals/Urinary Tract: Adrenal glands appear normal. Bilateral renal cortical scarring is noted. No hydronephrosis or renal obstruction is noted. No renal or ureteral calculi are noted. Urinary bladder is unremarkable. Stomach/Bowel: The stomach appears normal. There is no evidence of bowel obstruction or inflammation. Status post appendectomy. Vascular/Lymphatic: No significant vascular findings are present. No enlarged abdominal or pelvic lymph nodes. Reproductive: Status post hysterectomy. No adnexal masses. Other: No abdominal wall hernia or abnormality. No abdominopelvic ascites. Musculoskeletal: No acute or significant osseous findings. IMPRESSION: No acute abnormality seen in the abdomen or pelvis. Electronically Signed   By: Marijo Conception, M.D.   On: 03/16/2018 15:25   Dg Hips Bilat With Pelvis Min 5 Views  Result Date: 03/16/2018 CLINICAL DATA:  Pain in both hips for of L1 year, left more than right EXAM: DG HIP (WITH OR WITHOUT PELVIS) 5+V BILAT COMPARISON:  None. FINDINGS: The hip joints appear very normal for age with very little degenerative joint disease. The pelvic rami are intact. The SI joints appear corticated. The lower lumbar spine is unremarkable. IMPRESSION: Both hips appear normal. No significant degenerative change is seen. Electronically Signed   By: Ivar Drape M.D.   On: 03/16/2018 08:39   US Abdomen Limited Ruq  Result Date: 03/13/2018 CLINICAL DATA:  Bilious emesis. EXAM: ULTRASOUND ABDOMEN LIMITED RIGHT UPPER QUADRANT COMPARISON:  None. FINDINGS: Gallbladder: Surgically absent  Common bile duct: Diameter: Normal, 5 mm. Liver: Normal in echogenicity, without focal lesion. Portal vein is patent on color Doppler imaging with normal direction of blood flow towards the liver. IMPRESSION: No acute process or explanation for bilious emesis. Electronically Signed   By: Abigail Miyamoto M.D.   On: 03/13/2018 13:39    Micro Results  No results found for this or any previous visit (from the past 240 hour(s)).  Today   Subjective    Natalie Johnson today has no headache,no chest abdominal pain,no new weakness tingling or numbness, feels much better wants to go home today.    Objective   Blood pressure (!) 141/77, pulse 72, temperature 98.4 F (36.9 C), resp. rate 12, height 5\' 6"  (1.676 m), weight 59.9 kg, SpO2 96 %.   Intake/Output Summary (Last 24 hours) at 03/18/2018 0844 Last data filed at 03/18/2018 0500 Gross per 24 hour  Intake 1773.31 ml  Output -  Net 1773.31 ml    Exam Awake Alert, Oriented x 3, No new F.N deficits, Normal affect [place].AT,PERRAL Supple Neck,No JVD, No cervical lymphadenopathy appriciated.  Symmetrical Chest wall movement, Good air movement bilaterally, CTAB RRR,No Gallops,Rubs or new Murmurs, No Parasternal Heave +ve B.Sounds, Abd Soft, Non tender, No organomegaly appriciated, No rebound -guarding or rigidity. No Cyanosis, Clubbing or edema, No new Rash or bruise   Data Review   CBC w Diff:  Lab Results  Component Value Date   WBC 5.9 03/17/2018   HGB 11.2 (L) 03/17/2018   HCT 32.7 (L) 03/17/2018   PLT 192 03/17/2018    CMP:  Lab Results  Component Value Date   NA 144 03/18/2018   K 3.2 (L) 03/18/2018   CL 105 03/18/2018   CO2 26 03/18/2018   BUN <5 (L) 03/18/2018   CREATININE 1.08 (H) 03/18/2018   PROT 6.4 (L) 03/18/2018   ALBUMIN 3.5 03/18/2018   BILITOT 0.6 03/18/2018   ALKPHOS 86 03/18/2018   AST 29 03/18/2018   ALT 42 03/18/2018  .   Total Time in preparing paper work, data evaluation and todays exam - 44  minutes  Lala Lund M.D on 03/18/2018 at 8:44 AM  [facility]   Office  [phone]

## 2018-03-18 NOTE — Discharge Instructions (Signed)
Follow with Primary MD [name], MD in 2-3 days   Get CBC, CMP, 2 view Chest X ray -  checked  by Primary MD in 2days   Activity: As tolerated with Full fall precautions use walker/cane & assistance as needed  Disposition Home    Diet: Heart Healthy    Special Instructions: If you have smoked or chewed Tobacco  in the last 2 yrs please stop smoking, stop any regular Alcohol  and or any Recreational drug use.  On your next visit with your primary care physician please Get Medicines reviewed and adjusted.  Please request your Prim.MD to go over all Hospital Tests and Procedure/Radiological results at the follow up, please get all Hospital records sent to your Prim MD by signing hospital release before you go home.  If you experience worsening of your admission symptoms, develop shortness of breath, life threatening emergency, suicidal or homicidal thoughts you must seek medical attention immediately by calling 911 or calling your MD immediately  if symptoms less severe.  You Must read complete instructions/literature along with all the possible adverse reactions/side effects for all the Medicines you take and that have been prescribed to you. Take any new Medicines after you have completely understood and accpet all the possible adverse reactions/side effects.

## 2018-03-18 NOTE — Progress Notes (Signed)
Nsg Discharge Note  Admit Date:  03/13/2018 Discharge date: 03/18/2018   Natalie Johnson to be D/C'd Home per MD order.  AVS completed.  Copy for chart, and copy for patient signed, and dated. Patient/caregiver able to verbalize understanding.  Discharge Medication: Allergies as of 03/18/2018      Reactions   Augmentin [amoxicillin-pot Clavulanate] Anaphylaxis   Trihexyphenidyl Hcl Anaphylaxis   Vancomycin Anaphylaxis   Amoxicillin Hives   Penicillins Hives   DID THE REACTION INVOLVE: Swelling of the face/tongue/throat, SOB, or low BP? Unknown Sudden or severe rash/hives, skin peeling, or the inside of the mouth or nose? Unknown Did it require medical treatment? Unknown When did it last happen?2010 If all above answers are "NO", may proceed with cephalosporin use.   Piperacillin Hives   Sodium Acetylsalicylate [aspirin] Hives   Soma [carisoprodol] Hives   Linezolid Rash      Medication List    TAKE these medications   amitriptyline 100 MG tablet Commonly known as:  ELAVIL Take 100 mg by mouth at bedtime.   clonazePAM 2 MG tablet Commonly known as:  KLONOPIN Take 2 mg by mouth at bedtime.   fludrocortisone 0.1 MG tablet Commonly known as:  FLORINEF Take 0.1-0.2 mg by mouth See admin instructions. Take 0.2 mg in the morning and 0.1 mg in the evening   levothyroxine 100 MCG tablet Commonly known as:  SYNTHROID, LEVOTHROID Take 100 mcg by mouth daily before breakfast.   lovastatin 40 MG tablet Commonly known as:  MEVACOR Take 40 mg by mouth at bedtime.   omeprazole 40 MG capsule Commonly known as:  PRILOSEC Take 40 mg by mouth daily.   Potassium Chloride ER 20 MEQ Tbcr Take 20-40 mEq by mouth See admin instructions. Take 40 mg in the morning and 20 mg in the evening   rizatriptan 10 MG tablet Commonly known as:  MAXALT Take 10 mg by mouth as needed for migraine.       Discharge Assessment: Vitals:   03/17/18 2344 03/18/18 0532  BP: (!) 150/82 (!)  141/77  Pulse: 72 72  Resp:  12  Temp:  98.4 F (36.9 C)  SpO2:  96%   Skin clean, dry and intact without evidence of skin break down, no evidence of skin tears noted. IV catheter discontinued intact. Site without signs and symptoms of complications - no redness or edema noted at insertion site, patient denies c/o pain - only slight tenderness at site.  Dressing with slight pressure applied.  D/c Instructions-Education: Discharge instructions given to patient/family with verbalized understanding. D/c education completed with patient/family including follow up instructions, medication list, d/c activities limitations if indicated, with other d/c instructions as indicated by MD - patient able to verbalize understanding, all questions fully answered. Patient instructed to return to ED, call 911, or call MD for any changes in condition.  Patient escorted via [name], and D/C home via private auto.  Natalie N Crystal Ellwood, RN 03/18/2018 10:35 AM

## 2018-03-19 ENCOUNTER — Encounter: Payer: Self-pay | Admitting: Gastroenterology

## 2018-03-19 NOTE — Consult Note (Signed)
            [facility] CM Primary Care Navigator  03/19/2018  Natalie Johnson 1945/10/18 [id number]   Attempt to seepatient at the bedside to identify possible discharge needs butshe was already discharged homeover the weekend.  Per MD note, patient was admitted for acute pancreatitis, severe hypokalemia, transaminitis.  Patient has discharge instruction to follow-up withprimary care provider in 2- 3 days, endocrinology in 1 week and gastroenterology follow-up in 1 week.  Primary care provider's office is listed as providing transition of care (TOC) follow-up.   For additional questions please contact:  Edwena Felty A. Kanitra Purifoy, BSN, RN-BC [facility] PRIMARY CARE Navigator Cell: [phone]

## 2018-03-22 DIAGNOSIS — Z6821 Body mass index (BMI) 21.0-21.9, adult: Secondary | ICD-10-CM | POA: Diagnosis not present

## 2018-03-22 DIAGNOSIS — R197 Diarrhea, unspecified: Secondary | ICD-10-CM | POA: Diagnosis not present

## 2018-03-22 DIAGNOSIS — Z0000 Encounter for general adult medical examination without abnormal findings: Secondary | ICD-10-CM | POA: Diagnosis not present

## 2018-03-22 DIAGNOSIS — K8592 Acute pancreatitis with infected necrosis, unspecified: Secondary | ICD-10-CM | POA: Diagnosis not present

## 2018-03-22 DIAGNOSIS — Z1389 Encounter for screening for other disorder: Secondary | ICD-10-CM | POA: Diagnosis not present

## 2018-03-22 DIAGNOSIS — E039 Hypothyroidism, unspecified: Secondary | ICD-10-CM | POA: Diagnosis not present

## 2018-03-22 DIAGNOSIS — M25571 Pain in right ankle and joints of right foot: Secondary | ICD-10-CM | POA: Diagnosis not present

## 2018-03-22 DIAGNOSIS — E038 Other specified hypothyroidism: Secondary | ICD-10-CM | POA: Diagnosis not present

## 2018-03-22 DIAGNOSIS — E7849 Other hyperlipidemia: Secondary | ICD-10-CM | POA: Diagnosis not present

## 2018-03-22 DIAGNOSIS — R82998 Other abnormal findings in urine: Secondary | ICD-10-CM | POA: Diagnosis not present

## 2018-03-22 DIAGNOSIS — R21 Rash and other nonspecific skin eruption: Secondary | ICD-10-CM | POA: Diagnosis not present

## 2018-03-27 DIAGNOSIS — K8592 Acute pancreatitis with infected necrosis, unspecified: Secondary | ICD-10-CM | POA: Diagnosis not present

## 2018-03-27 DIAGNOSIS — I951 Orthostatic hypotension: Secondary | ICD-10-CM | POA: Diagnosis not present

## 2018-03-27 DIAGNOSIS — E038 Other specified hypothyroidism: Secondary | ICD-10-CM | POA: Diagnosis not present

## 2018-03-27 DIAGNOSIS — F4321 Adjustment disorder with depressed mood: Secondary | ICD-10-CM | POA: Diagnosis not present

## 2018-03-27 DIAGNOSIS — Z0000 Encounter for general adult medical examination without abnormal findings: Secondary | ICD-10-CM | POA: Diagnosis not present

## 2018-03-27 DIAGNOSIS — E7849 Other hyperlipidemia: Secondary | ICD-10-CM | POA: Diagnosis not present

## 2018-03-27 DIAGNOSIS — M797 Fibromyalgia: Secondary | ICD-10-CM | POA: Diagnosis not present

## 2018-03-27 DIAGNOSIS — R197 Diarrhea, unspecified: Secondary | ICD-10-CM | POA: Diagnosis not present

## 2018-03-27 DIAGNOSIS — M25571 Pain in right ankle and joints of right foot: Secondary | ICD-10-CM | POA: Diagnosis not present
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
## 2018-04-09 ENCOUNTER — Encounter (INDEPENDENT_AMBULATORY_CARE_PROVIDER_SITE_OTHER): Payer: Self-pay | Admitting: Ophthalmology

## 2018-04-09 ENCOUNTER — Ambulatory Visit (INDEPENDENT_AMBULATORY_CARE_PROVIDER_SITE_OTHER): Payer: Medicare HMO | Admitting: Ophthalmology

## 2018-04-09 DIAGNOSIS — H43822 Vitreomacular adhesion, left eye: Secondary | ICD-10-CM | POA: Diagnosis not present

## 2018-04-09 DIAGNOSIS — H26493 Other secondary cataract, bilateral: Secondary | ICD-10-CM | POA: Diagnosis not present

## 2018-04-09 DIAGNOSIS — H35033 Hypertensive retinopathy, bilateral: Secondary | ICD-10-CM

## 2018-04-09 DIAGNOSIS — Z961 Presence of intraocular lens: Secondary | ICD-10-CM

## 2018-04-09 DIAGNOSIS — L511 Stevens-Johnson syndrome: Secondary | ICD-10-CM

## 2018-04-09 DIAGNOSIS — H3581 Retinal edema: Secondary | ICD-10-CM

## 2018-04-09 DIAGNOSIS — H34831 Tributary (branch) retinal vein occlusion, right eye, with macular edema: Secondary | ICD-10-CM | POA: Diagnosis not present

## 2018-04-09 MED ORDER — BEVACIZUMAB CHEMO INJECTION 1.25MG/0.05ML SYRINGE FOR KALEIDOSCOPE
1.2500 mg | INTRAVITREAL | Status: AC
  Administered 2018-04-09: 1.25 mg via INTRAVITREAL

## 2018-04-16 ENCOUNTER — Encounter: Payer: Self-pay | Admitting: Gastroenterology

## 2018-04-16 ENCOUNTER — Encounter (INDEPENDENT_AMBULATORY_CARE_PROVIDER_SITE_OTHER): Payer: Self-pay

## 2018-04-16 ENCOUNTER — Ambulatory Visit: Payer: Medicare HMO | Admitting: Gastroenterology

## 2018-04-16 ENCOUNTER — Other Ambulatory Visit (INDEPENDENT_AMBULATORY_CARE_PROVIDER_SITE_OTHER): Payer: Medicare HMO

## 2018-04-16 VITALS — BP 134/78 | HR 67 | Ht 65.5 in | Wt 134.0 lb

## 2018-04-16 DIAGNOSIS — R197 Diarrhea, unspecified: Secondary | ICD-10-CM | POA: Diagnosis not present

## 2018-04-16 DIAGNOSIS — K851 Biliary acute pancreatitis without necrosis or infection: Secondary | ICD-10-CM

## 2018-04-16 LAB — COMPREHENSIVE METABOLIC PANEL
ALT: 10 U/L (ref 0–35)
AST: 16 U/L (ref 0–37)
Albumin: 4 g/dL (ref 3.5–5.2)
Alkaline Phosphatase: 106 U/L (ref 39–117)
BUN: 13 mg/dL (ref 6–23)
CO2: 26 mEq/L (ref 19–32)
Calcium: 9.3 mg/dL (ref 8.4–10.5)
Chloride: 103 mEq/L (ref 96–112)
Creatinine, Ser: 1.17 mg/dL (ref 0.40–1.20)
GFR: 45.53 mL/min — ABNORMAL LOW (ref >60.00)
Glucose, Bld: 93 mg/dL (ref 70–99)
Potassium: 3.5 mEq/L (ref 3.5–5.1)
Sodium: 139 mEq/L (ref 135–145)
Total Bilirubin: 0.5 mg/dL (ref 0.2–1.2)
Total Protein: 6.9 g/dL (ref 6.0–8.3)

## 2018-04-16 LAB — C-REACTIVE PROTEIN: CRP: <1 mg/dL (ref 0.5–20.0)

## 2018-04-16 LAB — TSH: TSH: 0.85 u[IU]/mL (ref 0.35–4.50)

## 2018-04-16 LAB — SEDIMENTATION RATE: Sed Rate: 25 mm/hr (ref 0–30)

## 2018-04-16 LAB — LIPASE: Lipase: 41 U/L (ref 11.0–59.0)
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
## 2018-04-16 NOTE — Patient Instructions (Signed)
Your provider has requested that you go to the basement level for lab work before leaving today. Press "B" on the elevator. The lab is located at the first door on the left as you exit the elevator.  You have been scheduled for an MRI at ______ on ______. Your appointment time is ______. Please arrive 15 minutes prior to your appointment time for registration purposes. Please make certain not to have anything to eat or drink 6 hours prior to your test. In addition, if you have any metal in your body, have a pacemaker or defibrillator, please be sure to let your ordering physician know. This test typically takes 45 minutes to 1 hour to complete. Should you need to reschedule, please call [phone] to do so.

## 2018-04-17 ENCOUNTER — Encounter: Payer: Self-pay | Admitting: Gastroenterology

## 2018-04-17 ENCOUNTER — Other Ambulatory Visit: Payer: Medicare HMO

## 2018-04-17 DIAGNOSIS — K851 Biliary acute pancreatitis without necrosis or infection: Secondary | ICD-10-CM | POA: Diagnosis not present

## 2018-04-17 DIAGNOSIS — R197 Diarrhea, unspecified: Secondary | ICD-10-CM

## 2018-04-19 ENCOUNTER — Ambulatory Visit: Payer: Medicare HMO | Admitting: Endocrinology

## 2018-04-19 ENCOUNTER — Encounter: Payer: Self-pay | Admitting: Endocrinology

## 2018-04-19 DIAGNOSIS — E876 Hypokalemia: Secondary | ICD-10-CM

## 2018-04-19 LAB — BASIC METABOLIC PANEL
BUN: 10 mg/dL (ref 6–23)
CO2: 30 mEq/L (ref 19–32)
Calcium: 9.4 mg/dL (ref 8.4–10.5)
Chloride: 104 mEq/L (ref 96–112)
Creatinine, Ser: 1.17 mg/dL (ref 0.40–1.20)
GFR: 45.53 mL/min — ABNORMAL LOW (ref >60.00)
Glucose, Bld: 84 mg/dL (ref 70–99)
Potassium: 3.5 mEq/L (ref 3.5–5.1)
Sodium: 143 mEq/L (ref 135–145)

## 2018-04-19 LAB — CORTISOL: Cortisol, Plasma: 8.7 ug/dL

## 2018-04-19 LAB — CALPROTECTIN, FECAL: Calprotectin, Fecal: 23 ug/g (ref 0–120)

## 2018-04-19 LAB — MAGNESIUM: Magnesium: 1.6 mg/dL (ref 1.5–2.5)

## 2018-04-19 LAB — PHOSPHORUS: Phosphorus: 4.2 mg/dL (ref 2.3–4.6)

## 2018-04-19 NOTE — Patient Instructions (Addendum)
Blood tests are requested for you today.  We'll let you know about the results.  I would be happy to see you back here as needed It is likely that the fludrocortisone contributed to the low potassium.  However, it is helping you, so I would not stop it if I was you--just make sure to have the potassium checked whenever you get sick.
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
## 2018-04-20 LAB — PTH, INTACT AND CALCIUM
Calcium: 9.6 mg/dL (ref 8.6–10.4)
PTH: 126 pg/mL — ABNORMAL HIGH (ref 14–64)

## 2018-04-20 LAB — VITAMIN D 25 HYDROXY (VIT D DEFICIENCY, FRACTURES): VITD: 17.66 ng/mL — ABNORMAL LOW (ref 30.00–100.00)

## 2018-04-24 LAB — STOOL CULTURE
MICRO NUMBER:: 115503
MICRO NUMBER:: 115504
MICRO NUMBER:: 115506
SHIGA RESULT:: NOT DETECTED
SPECIMEN QUALITY:: ADEQUATE
SPECIMEN QUALITY:: ADEQUATE
SPECIMEN QUALITY:: ADEQUATE

## 2018-04-24 LAB — GIARDIA ANTIGEN
MICRO NUMBER:: 115505
RESULT:: NOT DETECTED
SPECIMEN QUALITY:: ADEQUATE

## 2018-04-24 LAB — PANCREATIC ELASTASE, FECAL: Pancreatic Elastase-1, Stool: >500 mcg/g

## 2018-04-30 ENCOUNTER — Other Ambulatory Visit: Payer: Self-pay | Admitting: Gastroenterology

## 2018-04-30 ENCOUNTER — Ambulatory Visit (HOSPITAL_COMMUNITY)
Admission: RE | Admit: 2018-04-30 | Discharge: 2018-04-30 | Disposition: A | Discharge status: Home / Self Care | Payer: Medicare HMO | Source: Ambulatory Visit | Attending: Gastroenterology | Admitting: Gastroenterology

## 2018-04-30 DIAGNOSIS — K851 Biliary acute pancreatitis without necrosis or infection: Secondary | ICD-10-CM

## 2018-04-30 DIAGNOSIS — R197 Diarrhea, unspecified: Secondary | ICD-10-CM

## 2018-04-30 NOTE — Progress Notes (Signed)
Pt has loop recorder per pt. Unsure of make and model. No record in epic of make and model needed to proceed with MRI. Pt is attempting to find identification card. As well as reach out to [employer] [place] where recorder was placed. MRI department doing the same to try and receive records. Pt to call back to MRI department if ID is located.

## 2018-05-05 ENCOUNTER — Ambulatory Visit (HOSPITAL_COMMUNITY)
Admission: RE | Admit: 2018-05-05 | Discharge: 2018-05-05 | Disposition: A | Discharge status: Home / Self Care | Payer: Medicare HMO | Source: Ambulatory Visit | Attending: Gastroenterology | Admitting: Gastroenterology

## 2018-05-05 DIAGNOSIS — K851 Biliary acute pancreatitis without necrosis or infection: Secondary | ICD-10-CM | POA: Diagnosis not present

## 2018-05-05 DIAGNOSIS — K859 Acute pancreatitis without necrosis or infection, unspecified: Secondary | ICD-10-CM | POA: Diagnosis not present

## 2018-05-05 DIAGNOSIS — R197 Diarrhea, unspecified: Secondary | ICD-10-CM | POA: Diagnosis not present

## 2018-05-05 DIAGNOSIS — R932 Abnormal findings on diagnostic imaging of liver and biliary tract: Secondary | ICD-10-CM | POA: Diagnosis not present

## 2018-05-05 MED ORDER — GADOBUTROL 1 MMOL/ML IV SOLN
6.0000 mL | Freq: Once | INTRAVENOUS | Status: AC | PRN
  Administered 2018-05-05: 6 mL via INTRAVENOUS

## 2018-05-18 ENCOUNTER — Ambulatory Visit: Payer: Medicare HMO | Admitting: Gastroenterology

## 2018-05-18 ENCOUNTER — Encounter: Payer: Self-pay | Admitting: Gastroenterology

## 2018-05-18 VITALS — BP 104/76 | HR 76 | Ht 65.5 in | Wt 137.0 lb

## 2018-05-18 DIAGNOSIS — K859 Acute pancreatitis without necrosis or infection, unspecified: Secondary | ICD-10-CM | POA: Diagnosis not present

## 2018-05-18 DIAGNOSIS — K529 Noninfective gastroenteritis and colitis, unspecified: Secondary | ICD-10-CM | POA: Diagnosis not present

## 2018-05-18 MED ORDER — SUPREP BOWEL PREP KIT 17.5-3.13-1.6 GM/177ML PO SOLN: 1.0000 | ORAL | 0 refills | Status: DC

## 2018-05-18 NOTE — Patient Instructions (Addendum)
Please use a stool bulking agent like Metamucil, Citrucel, or Benefiber every day.   I have recommended a colonoscopy with random biopsies to further investigate your diarrhea.   If you are age 72 or older, your body mass index should be between 23-30. Your Body mass index is 22.45 kg/m. If this is out of the aforementioned range listed, please consider follow up with your Primary Care Provider.  Thank you for choosing me and [facility].  Dr.Maris Bena
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
## 2018-05-21 ENCOUNTER — Encounter: Payer: Self-pay | Admitting: Gastroenterology
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
## 2018-06-04 ENCOUNTER — Ambulatory Visit (INDEPENDENT_AMBULATORY_CARE_PROVIDER_SITE_OTHER): Payer: Medicare HMO | Admitting: Ophthalmology

## 2018-06-04 ENCOUNTER — Encounter (INDEPENDENT_AMBULATORY_CARE_PROVIDER_SITE_OTHER): Payer: Self-pay | Admitting: Ophthalmology

## 2018-06-04 ENCOUNTER — Other Ambulatory Visit: Payer: Self-pay

## 2018-06-04 DIAGNOSIS — Z961 Presence of intraocular lens: Secondary | ICD-10-CM

## 2018-06-04 DIAGNOSIS — H43822 Vitreomacular adhesion, left eye: Secondary | ICD-10-CM | POA: Diagnosis not present

## 2018-06-04 DIAGNOSIS — L511 Stevens-Johnson syndrome: Secondary | ICD-10-CM | POA: Diagnosis not present

## 2018-06-04 DIAGNOSIS — H26493 Other secondary cataract, bilateral: Secondary | ICD-10-CM

## 2018-06-04 DIAGNOSIS — H3581 Retinal edema: Secondary | ICD-10-CM | POA: Diagnosis not present

## 2018-06-04 DIAGNOSIS — H35033 Hypertensive retinopathy, bilateral: Secondary | ICD-10-CM

## 2018-06-04 DIAGNOSIS — H34831 Tributary (branch) retinal vein occlusion, right eye, with macular edema: Secondary | ICD-10-CM

## 2018-06-06 ENCOUNTER — Encounter (INDEPENDENT_AMBULATORY_CARE_PROVIDER_SITE_OTHER): Payer: Self-pay | Admitting: Ophthalmology

## 2018-06-06 MED ORDER — AFLIBERCEPT 2MG/0.05ML IZ SOLN FOR KALEIDOSCOPE
2.0000 mg | INTRAVITREAL | Status: AC | PRN
  Administered 2018-06-06: 2 mg via INTRAVITREAL

## 2018-06-14 ENCOUNTER — Encounter: Payer: Medicare HMO | Admitting: Gastroenterology

## 2018-07-30 ENCOUNTER — Encounter (INDEPENDENT_AMBULATORY_CARE_PROVIDER_SITE_OTHER): Payer: Medicare HMO | Admitting: Ophthalmology

## 2018-08-06 ENCOUNTER — Encounter (INDEPENDENT_AMBULATORY_CARE_PROVIDER_SITE_OTHER): Payer: Self-pay | Admitting: Ophthalmology

## 2018-08-06 ENCOUNTER — Ambulatory Visit (INDEPENDENT_AMBULATORY_CARE_PROVIDER_SITE_OTHER): Payer: Medicare HMO | Admitting: Ophthalmology

## 2018-08-06 ENCOUNTER — Other Ambulatory Visit: Payer: Self-pay

## 2018-08-06 DIAGNOSIS — Z961 Presence of intraocular lens: Secondary | ICD-10-CM

## 2018-08-06 DIAGNOSIS — H43822 Vitreomacular adhesion, left eye: Secondary | ICD-10-CM | POA: Diagnosis not present

## 2018-08-06 DIAGNOSIS — H3581 Retinal edema: Secondary | ICD-10-CM | POA: Diagnosis not present

## 2018-08-06 DIAGNOSIS — H26493 Other secondary cataract, bilateral: Secondary | ICD-10-CM | POA: Diagnosis not present

## 2018-08-06 DIAGNOSIS — H35033 Hypertensive retinopathy, bilateral: Secondary | ICD-10-CM | POA: Diagnosis not present

## 2018-08-06 DIAGNOSIS — H34831 Tributary (branch) retinal vein occlusion, right eye, with macular edema: Secondary | ICD-10-CM | POA: Diagnosis not present

## 2018-08-06 DIAGNOSIS — L511 Stevens-Johnson syndrome: Secondary | ICD-10-CM

## 2018-08-06 MED ORDER — AFLIBERCEPT 2MG/0.05ML IZ SOLN FOR KALEIDOSCOPE
2.0000 mg | INTRAVITREAL | Status: AC | PRN
  Administered 2018-08-06: 2 mg via INTRAVITREAL
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
## 2018-09-25 DIAGNOSIS — E785 Hyperlipidemia, unspecified: Secondary | ICD-10-CM | POA: Diagnosis not present

## 2018-09-25 DIAGNOSIS — I951 Orthostatic hypotension: Secondary | ICD-10-CM | POA: Diagnosis not present

## 2018-09-25 DIAGNOSIS — E039 Hypothyroidism, unspecified: Secondary | ICD-10-CM | POA: Diagnosis not present

## 2018-09-25 DIAGNOSIS — G629 Polyneuropathy, unspecified: Secondary | ICD-10-CM | POA: Diagnosis not present

## 2018-09-27 DIAGNOSIS — E7849 Other hyperlipidemia: Secondary | ICD-10-CM | POA: Diagnosis not present

## 2018-09-27 DIAGNOSIS — G909 Disorder of the autonomic nervous system, unspecified: Secondary | ICD-10-CM | POA: Diagnosis not present
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
## 2018-10-01 ENCOUNTER — Ambulatory Visit (INDEPENDENT_AMBULATORY_CARE_PROVIDER_SITE_OTHER): Payer: Medicare HMO | Admitting: Ophthalmology

## 2018-10-01 ENCOUNTER — Encounter (INDEPENDENT_AMBULATORY_CARE_PROVIDER_SITE_OTHER): Payer: Self-pay | Admitting: Ophthalmology

## 2018-10-01 ENCOUNTER — Other Ambulatory Visit: Payer: Self-pay

## 2018-10-01 DIAGNOSIS — H43822 Vitreomacular adhesion, left eye: Secondary | ICD-10-CM | POA: Diagnosis not present

## 2018-10-01 DIAGNOSIS — H26493 Other secondary cataract, bilateral: Secondary | ICD-10-CM

## 2018-10-01 DIAGNOSIS — L511 Stevens-Johnson syndrome: Secondary | ICD-10-CM

## 2018-10-01 DIAGNOSIS — H34831 Tributary (branch) retinal vein occlusion, right eye, with macular edema: Secondary | ICD-10-CM | POA: Diagnosis not present

## 2018-10-01 DIAGNOSIS — H35033 Hypertensive retinopathy, bilateral: Secondary | ICD-10-CM

## 2018-10-01 DIAGNOSIS — Z961 Presence of intraocular lens: Secondary | ICD-10-CM

## 2018-10-01 DIAGNOSIS — H3581 Retinal edema: Secondary | ICD-10-CM | POA: Diagnosis not present

## 2018-10-01 MED ORDER — AFLIBERCEPT 2MG/0.05ML IZ SOLN FOR KALEIDOSCOPE
2.0000 mg | INTRAVITREAL | Status: AC | PRN
  Administered 2018-10-01: 2 mg via INTRAVITREAL

## 2018-10-02 ENCOUNTER — Telehealth (INDEPENDENT_AMBULATORY_CARE_PROVIDER_SITE_OTHER): Payer: Self-pay

## 2018-10-23 ENCOUNTER — Encounter: Payer: Self-pay | Admitting: Neurology

## 2018-10-23 ENCOUNTER — Ambulatory Visit (INDEPENDENT_AMBULATORY_CARE_PROVIDER_SITE_OTHER): Payer: Medicare HMO | Admitting: Neurology

## 2018-10-23 ENCOUNTER — Other Ambulatory Visit: Payer: Self-pay

## 2018-10-23 VITALS — BP 118/88 | HR 86 | Temp 99.3°F | Ht 65.5 in | Wt 153.0 lb

## 2018-10-23 DIAGNOSIS — G909 Disorder of the autonomic nervous system, unspecified: Secondary | ICD-10-CM

## 2018-10-23 DIAGNOSIS — R269 Unspecified abnormalities of gait and mobility: Secondary | ICD-10-CM | POA: Diagnosis not present

## 2018-10-23 DIAGNOSIS — G3281 Cerebellar ataxia in diseases classified elsewhere: Secondary | ICD-10-CM

## 2018-10-23 MED ORDER — PYRIDOSTIGMINE BROMIDE 60 MG PO TABS: 60.0000 mg | ORAL_TABLET | Freq: Three times a day (TID) | ORAL | 11 refills | Status: DC
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
## 2018-10-24 ENCOUNTER — Telehealth: Payer: Self-pay | Admitting: Neurology

## 2018-10-24 LAB — ANA W/REFLEX IF POSITIVE: Anti Nuclear Antibody (ANA): NEGATIVE

## 2018-10-24 LAB — PROTEIN ELECTROPHORESIS
A/G Ratio: 1.3 (ref 0.7–1.7)
Albumin ELP: 3.8 g/dL (ref 2.9–4.4)
Alpha 1: 0.2 g/dL (ref 0.0–0.4)
Alpha 2: 0.8 g/dL (ref 0.4–1.0)
Beta: 1.1 g/dL (ref 0.7–1.3)
Gamma Globulin: 0.9 g/dL (ref 0.4–1.8)
Globulin, Total: 3 g/dL (ref 2.2–3.9)
Total Protein: 6.8 g/dL (ref 6.0–8.5)

## 2018-10-24 LAB — VITAMIN B12: Vitamin B-12: 220 pg/mL — ABNORMAL LOW (ref 232–1245)

## 2018-10-24 LAB — RPR: RPR Ser Ql: NONREACTIVE

## 2018-10-24 LAB — CK: Total CK: 36 U/L (ref 32–182)

## 2018-10-24 LAB — TSH: TSH: 0.846 u[IU]/mL (ref 0.450–4.500)

## 2018-10-24 LAB — SEDIMENTATION RATE: Sed Rate: 26 mm/hr (ref 0–40)

## 2018-10-24 LAB — HGB A1C W/O EAG: Hgb A1c MFr Bld: 5.2 % (ref 4.8–5.6)

## 2018-10-24 LAB — VITAMIN D 25 HYDROXY (VIT D DEFICIENCY, FRACTURES): Vit D, 25-Hydroxy: 9.7 ng/mL — ABNORMAL LOW (ref 30.0–100.0)

## 2018-10-24 LAB — C-REACTIVE PROTEIN: CRP: 3 mg/L (ref 0–10)

## 2018-10-24 NOTE — Telephone Encounter (Signed)
Natalie Johnson: [id number] (exp. 10/24/18 to 11/23/18) order sent to GI. They will reach out to the patient to schedule.

## 2018-10-24 NOTE — Telephone Encounter (Signed)
[employer] pending faxed notes  

## 2018-10-24 NOTE — Telephone Encounter (Signed)
Please call patient, extensive laboratory evaluation showed significantly low vitamin D level  She would benefit over-the-counter vitamin D3 supplement, 1000 units 3 tablets daily,  Low vitamin B12 220, she will benefit vitamin B12 supplement, better to get it IM 1000 mcg daily for 1 week, then weekly for 1 month, then monthly  Rest of the laboratory evaluations were normal

## 2018-10-25 ENCOUNTER — Other Ambulatory Visit: Payer: Self-pay | Admitting: Neurology

## 2018-10-25 DIAGNOSIS — R269 Unspecified abnormalities of gait and mobility: Secondary | ICD-10-CM

## 2018-10-25 DIAGNOSIS — E538 Deficiency of other specified B group vitamins: Secondary | ICD-10-CM

## 2018-10-25 MED ORDER — CYANOCOBALAMIN 1000 MCG/ML IJ SOLN
1000.0000 ug | Freq: Every day | INTRAMUSCULAR | Status: AC
  Administered 2018-10-29: 1000 ug via INTRAMUSCULAR

## 2018-10-25 NOTE — Telephone Encounter (Signed)
Called the patient back and advised of Dr Rhea Belton recommendation. Advised that we can start administering the vitamin b 12 next week. Patient scheduled to come in on Monday for first injection at 2 pm.

## 2018-10-25 NOTE — Telephone Encounter (Signed)
Called the patient and advised that her Vitamind D level was low. Informed her Dr Krista Blue recommended her starting a Vitamin D supplement over the counter. Advised of the recommended dose per MD. Informed that vitamin B12 is also low. Patient is agreeable to starting a Vitamin b 12 supplement and agreed to IM injections. The patient doesn't have anyone to give the injections for her so would have to get them completed in our office. Advised I would inform Dr. Krista Blue of this information and see if she still would recommend the patient complete the injections daily to begin with. Pt verbalized understanding. Advised I would call the patient back with Dr Krista Blue recommendations and get her scheduled for the first injection. Pt verbalized understanding and was appreciative for the call.

## 2018-10-25 NOTE — Telephone Encounter (Signed)
vitamin B12 im supplement, 1000 mcg daily for 1 week,  weekly for 1 month,  then monthly

## 2018-10-29 ENCOUNTER — Other Ambulatory Visit: Payer: Self-pay

## 2018-10-29 ENCOUNTER — Ambulatory Visit (INDEPENDENT_AMBULATORY_CARE_PROVIDER_SITE_OTHER): Payer: Medicare HMO | Admitting: Neurology

## 2018-10-29 ENCOUNTER — Telehealth: Payer: Self-pay

## 2018-10-29 DIAGNOSIS — E538 Deficiency of other specified B group vitamins: Secondary | ICD-10-CM | POA: Diagnosis not present

## 2018-10-29 NOTE — Telephone Encounter (Signed)
Patient called and stated that her doctor has not received any information in regards to them giving her the B-12 injections at their office. Please advise.

## 2018-10-29 NOTE — Telephone Encounter (Signed)
I have faxed it to Dr. Kaylyn Lim office a second time (fax [phone]).  Confirmation received both times.   B12 injection schedule and lab results sent.

## 2018-10-30 ENCOUNTER — Telehealth: Payer: Self-pay

## 2018-11-06 ENCOUNTER — Other Ambulatory Visit: Payer: Self-pay | Admitting: *Deleted

## 2018-11-06 NOTE — Patient Outreach (Signed)
[facility]) Care Management  11/06/2018  Natalie Johnson 09/13/1945 [id number]   Subjective: Telephone call to patient's home number, spoke with patient, and HIPAA verified.  Discussed [place] Medicare EMMI Prevent referral follow up, patient voiced understanding, and is in agreement to follow up.   Patient states she is doing fine, remembers speaking with referral source, is currently at the bank, unable to talk at this time, and requested call back at a later time.      Objective: Per KPN (Knowledge Performance Now, point of care tool) and chart review, patient has had no recent ED visits or hospitalizations.  Patient has a history of hypertension, autoimmune autonomic neuropathy, Stevens-Johnson syndrome, fibromyalgia, necrotizing lung infection (Necrotizing pneumonia )  requiring lobectomy, Acute Pancreatitis, Hypothyroidism, Dyslipidemia, hyperlipidemia, Fibromyalgia, AKI (acute kidney injury), Chronic insomnia, Migraine, Skin cancer, basal cell (face, arms), Orthostatic hypotension, chronic diarrhea, and Hypertensive retinopathy of both eyes.      Assessment: Received [employer] Medicare EMMI Prevent referral follow up on 10/30/2018.  Referral source: [facility].    Referral reason: Patient engagement tool score.   Screening  follow up pending patient contact.      Plan: RNCM will send unsuccessful outreach  letter, [facility] pamphlet, will call patient for 2nd telephone outreach attempt within 4 business days, screening follow up, and will proceed with case closure within 10 business days if no return call.      Gabrelle Roca H. Annia Friendly, BSN, [place] [facility] Telephonic CM Phone: [phone] Fax: [phone]

## 2018-11-07 ENCOUNTER — Other Ambulatory Visit: Payer: Self-pay | Admitting: *Deleted

## 2018-11-07 ENCOUNTER — Encounter: Payer: Self-pay | Admitting: *Deleted

## 2018-11-07 NOTE — Patient Outreach (Addendum)
[facility]) Care Management  11/07/2018  Natalie Johnson October 21, 1945 [id number]   Subjective: Telephone call to patient's home number, spoke with patient, and HIPAA verified.  Discussed [place] Medicare EMMI Prevent referral  follow up, patient voiced understanding, and is in agreement to follow up.  Patient states she is doing okay, has a follow up appointment with primary MD on 11/08/2018, and dealing with signs/ symptoms of orthostatic hypotension.   States she has had approximately 5 falls over the last 3 months without injury due to orthostatic hypotension, can feel it coming on, has an aura, discussed fall prevention / safety strategies (avoid clutter, safety proof environment, no rugs, sitting down, slowly changing positions), patient voiced understanding, and states signs/ symptoms are being addressed by neurologist.  Patient states she is aware of signs/ symptoms to report, how to reach provider if needed after hours, when to go to ED, and / or call 911.  Patient states she is able to manage self care and has assistance as needed.  States she self quarantine as much as possible due to covid pandemic and only goes out when necessary.  Patient voices understanding of medical diagnosis and treatment plan.  States she is accessing her [facility] Medicare benefits as needed via member services number on back of card.  States she has been researching on line regarding Autoimmune autonomic neuropathy, advised of the [employer] for Rare [employer] web site, has questioned regarding her prognosis, advised to discuss her questions, and verify website information with neurologist, patient voices understanding, states she will follow up with neurologist regarding her questions, she will verify information obtain on line with her providers as needed.  Discussed Advanced Directives, advised of [place] [facility] Social Worker Advanced Directives document completion  benefit, patient voices understanding, and  declined to access benefit at this time.  Patient in agreement to received Advanced Directive packet and will notify [facility] if services needed in the future.  Discussed [facility] Care Management services, patient declined the following: [place] [facility] Telephonic RNCM referral for care coordination, Health Coach referral for hypertension disease education, Social Worker referral for [employer] / document completion follow up.  Patient states she does not have any education material, transition of care, care coordination, disease management, disease monitoring, transportation, community resource, or pharmacy needs at this time.  States she is very appreciative of the follow up and is in agreement to receive [place] [facility] information.     Objective: Per KPN (Knowledge Performance Now, point of care tool) and chart review, patient has had no recent ED visits or hospitalizations.  Patient has a history of hypertension, autoimmune autonomic neuropathy, Stevens-Johnson syndrome, fibromyalgia, necrotizing lung infection (Necrotizing pneumonia )  requiring lobectomy, Acute Pancreatitis, Hypothyroidism, Dyslipidemia, hyperlipidemia, Fibromyalgia, AKI (acute kidney injury), Chronic insomnia, Migraine, Skin cancer, basal cell (face, arms), Orthostatic hypotension, chronic diarrhea, and Hypertensive retinopathy of both eyes.      Assessment: Received [employer] Medicare EMMI Prevent referral follow up on 10/30/2018.  Referral source: [facility].    Referral reason: Patient engagement tool score. Screening completed, patient refusing services and will proceed with case closure.     Plan: RNCM will send patient successful outreach letter, [facility] pamphlet, Advanced Directive packet, and magnet. RNCM will close case due to patient declining [facility] Care Management services. RNCM will send MD case closure letter.         Aseret Hoffman H. Burt Knack Therapist, sports, BSN, CCM [facility]  Care Management [facility] Telephonic CM Phone: [phone] Fax: [phone]

## 2018-11-08 DIAGNOSIS — Z23 Encounter for immunization: Secondary | ICD-10-CM | POA: Diagnosis not present

## 2018-11-08 DIAGNOSIS — E538 Deficiency of other specified B group vitamins: Secondary | ICD-10-CM | POA: Diagnosis not present

## 2018-11-15 DIAGNOSIS — E538 Deficiency of other specified B group vitamins: Secondary | ICD-10-CM | POA: Diagnosis not present

## 2018-11-21 ENCOUNTER — Ambulatory Visit
Admission: RE | Admit: 2018-11-21 | Discharge: 2018-11-21 | Disposition: A | Discharge status: Home / Self Care | Payer: Medicare HMO | Source: Ambulatory Visit | Attending: Neurology | Admitting: Neurology

## 2018-11-21 ENCOUNTER — Other Ambulatory Visit: Payer: Self-pay

## 2018-11-21 DIAGNOSIS — G3281 Cerebellar ataxia in diseases classified elsewhere: Secondary | ICD-10-CM

## 2018-11-21 DIAGNOSIS — R202 Paresthesia of skin: Secondary | ICD-10-CM | POA: Diagnosis not present

## 2018-11-21 DIAGNOSIS — R269 Unspecified abnormalities of gait and mobility: Secondary | ICD-10-CM | POA: Diagnosis not present

## 2018-11-22 DIAGNOSIS — E538 Deficiency of other specified B group vitamins: Secondary | ICD-10-CM | POA: Diagnosis not present
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
## 2018-11-27 ENCOUNTER — Other Ambulatory Visit: Payer: Self-pay

## 2018-11-27 ENCOUNTER — Encounter (INDEPENDENT_AMBULATORY_CARE_PROVIDER_SITE_OTHER): Payer: Self-pay | Admitting: Ophthalmology

## 2018-11-27 ENCOUNTER — Telehealth: Payer: Self-pay | Admitting: *Deleted

## 2018-11-27 ENCOUNTER — Ambulatory Visit (INDEPENDENT_AMBULATORY_CARE_PROVIDER_SITE_OTHER): Payer: Medicare HMO | Admitting: Ophthalmology

## 2018-11-27 DIAGNOSIS — H34831 Tributary (branch) retinal vein occlusion, right eye, with macular edema: Secondary | ICD-10-CM | POA: Diagnosis not present

## 2018-11-27 DIAGNOSIS — H35033 Hypertensive retinopathy, bilateral: Secondary | ICD-10-CM

## 2018-11-27 DIAGNOSIS — H3581 Retinal edema: Secondary | ICD-10-CM | POA: Diagnosis not present

## 2018-11-27 DIAGNOSIS — H26493 Other secondary cataract, bilateral: Secondary | ICD-10-CM

## 2018-11-27 DIAGNOSIS — L511 Stevens-Johnson syndrome: Secondary | ICD-10-CM

## 2018-11-27 DIAGNOSIS — H43822 Vitreomacular adhesion, left eye: Secondary | ICD-10-CM | POA: Diagnosis not present

## 2018-11-27 DIAGNOSIS — Z961 Presence of intraocular lens: Secondary | ICD-10-CM | POA: Diagnosis not present

## 2018-11-27 MED ORDER — AFLIBERCEPT 2MG/0.05ML IZ SOLN FOR KALEIDOSCOPE
2.0000 mg | INTRAVITREAL | Status: AC | PRN
  Administered 2018-11-27: 2 mg via INTRAVITREAL

## 2018-11-27 NOTE — Telephone Encounter (Signed)
I have spoken with the patient and she verbalized understanding of her MRI results.

## 2018-11-27 NOTE — Telephone Encounter (Signed)
-----   Message from Marcial Pacas, MD sent at 11/27/2018 11:42 AM EDT ----- Please call pt for normal MRI cervical

## 2018-11-29 DIAGNOSIS — Z4800 Encounter for change or removal of nonsurgical wound dressing: Secondary | ICD-10-CM | POA: Diagnosis not present

## 2018-11-29 DIAGNOSIS — T2220XA Burn of second degree of shoulder and upper limb, except wrist and hand, unspecified site, initial encounter: Secondary | ICD-10-CM | POA: Diagnosis not present

## 2018-11-29 DIAGNOSIS — E538 Deficiency of other specified B group vitamins: Secondary | ICD-10-CM | POA: Diagnosis not present

## 2018-12-07 DIAGNOSIS — E538 Deficiency of other specified B group vitamins: Secondary | ICD-10-CM | POA: Diagnosis not present

## 2019-01-09 ENCOUNTER — Other Ambulatory Visit: Payer: Self-pay

## 2019-01-09 ENCOUNTER — Ambulatory Visit: Payer: Medicare HMO | Admitting: Neurology

## 2019-01-09 ENCOUNTER — Ambulatory Visit (INDEPENDENT_AMBULATORY_CARE_PROVIDER_SITE_OTHER): Payer: Medicare HMO | Admitting: Neurology

## 2019-01-09 DIAGNOSIS — R202 Paresthesia of skin: Secondary | ICD-10-CM

## 2019-01-09 DIAGNOSIS — E538 Deficiency of other specified B group vitamins: Secondary | ICD-10-CM | POA: Diagnosis not present

## 2019-01-09 DIAGNOSIS — E559 Vitamin D deficiency, unspecified: Secondary | ICD-10-CM | POA: Diagnosis not present

## 2019-01-09 DIAGNOSIS — G909 Disorder of the autonomic nervous system, unspecified: Secondary | ICD-10-CM

## 2019-01-09 MED ORDER — VITAMIN D (ERGOCALCIFEROL) 1.25 MG (50000 UNIT) PO CAPS: 50000.0000 [IU] | ORAL_CAPSULE | ORAL | 0 refills | Status: DC
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
## 2019-01-09 NOTE — Procedures (Signed)
Full Name: Natalie Johnson Gender: Female MRN #: [id number] Date of Birth: 12/12/1945    Visit Date: 01/09/2019 08:59 Age: 73 Years 2 Months Old Examining Physician: Marcial Pacas, MD  Referring Physician: Marcial Pacas, MD History:   73 years old with intermittent bilateral feet paresthesia, also has dizziness with sudden positional change  Summary of the tests: Nerve conduction study: Right sural sensory response was absent.  Left sural, bilateral superficial peroneal sensory responses showed significantly decreased snap amplitude. Bilateral tibial and right peroneal to EDB motor responses were normal. Left peroneal to EDB motor response showed mildly decreased CMAP amplitude. Left ulnar sensory and motor responses were normal.  Electromyography: Selected needle examinations of left lower extremity muscles were normal.   Conclusion:  This is a mild abnormal study.  There is electrodiagnostic evidence of mild axonal sensorimotor polyneuropathy.  There is no evidence of left lumbar radiculopathy.   ------------------------------- Marcial Pacas, M.D. PhD [facility] [place], [place] [address] Tel: [phone] Fax: [phone]        [facility]    Nerve / Sites Muscle Latency Ref. Amplitude Ref. Rel Amp Segments Distance Velocity Ref. Area    ms ms mV mV %  cm m/s m/s mVms  L Ulnar - ADM     Wrist ADM 3.0 ?3.3 10.6 ?6.0 100 Wrist - ADM 7   27.5     B.Elbow ADM 6.1  9.8  92.5 B.Elbow - Wrist 18 58 ?49 24.4     A.Elbow ADM 8.1  9.8  99.2 A.Elbow - B.Elbow 10 49 ?49 25.0         A.Elbow - Wrist      L Peroneal - EDB     Ankle EDB 4.2 ?6.5 3.8 ?2.0 100 Ankle - EDB 9   12.5     Fib head EDB 10.4  3.8  102 Fib head - Ankle 29 47 ?44 12.4     Pop fossa EDB 12.4  3.8  98.6 Pop fossa - Fib head 10 49 ?44 12.4         Pop fossa - Ankle      R Peroneal - EDB     Ankle EDB 5.6 ?6.5 1.6 ?2.0 100 Ankle - EDB 9   5.5     Fib head EDB 13.2  1.1  68.1 Fib head -  Ankle 29 38 ?44 4.4     Pop fossa EDB 15.4  1.1  104 Pop fossa - Fib head 10 46 ?44 4.6         Pop fossa - Ankle      L Tibial - AH     Ankle AH 3.2 ?5.8 8.9 ?4.0 100 Ankle - AH 9   18.8     Pop fossa AH 11.7  6.3  71.2 Pop fossa - Ankle 38 44 ?41 16.9  R Tibial - AH     Ankle AH 3.4 ?5.8 9.4 ?4.0 100 Ankle - AH 9   23.8     Pop fossa AH 12.0  7.8  83.4 Pop fossa - Ankle 38 44 ?41 17.7               SNC    Nerve / Sites Rec. Site Peak Lat Ref.  Amp Ref. Segments Distance    ms ms V V  cm  L Sural - Ankle (Calf)     Calf Ankle NR ?4.4 NR ?6 Calf - Ankle 14  R Sural - Ankle (Calf)     Calf Ankle 4.0 ?4.4 1 ?6 Calf - Ankle 14  L Superficial peroneal - Ankle     Lat leg Ankle 3.4 ?4.4 3 ?6 Lat leg - Ankle 14  R Superficial peroneal - Ankle     Lat leg Ankle 3.9 ?4.4 1 ?6 Lat leg - Ankle 14  L Ulnar - Orthodromic, (Dig V, Mid palm)     Dig V Wrist 2.6 ?3.1 5 ?5 Dig V - Wrist 60                F  Wave    Nerve F Lat Ref.   ms ms  L Tibial - AH 45.8 ?56.0  R Tibial - AH 49.6 ?56.0  L Ulnar - ADM 28.7 ?32.0           EMG       EMG Summary Table    Spontaneous MUAP Recruitment  Muscle IA Fib PSW Fasc Other Amp Dur. Poly Pattern  L. Tibialis anterior Normal None None None _______ Normal Normal Normal Normal  L. Tibialis posterior Normal None None None _______ Normal Normal Normal Normal  L. Peroneus longus Normal None None None _______ Normal Normal Normal Normal  L. Gastrocnemius (Medial head) Normal None None None _______ Normal Normal Normal Normal  L. Vastus lateralis Normal None None None _______ Normal Normal Normal Normal

## 2019-01-22 DIAGNOSIS — E538 Deficiency of other specified B group vitamins: Secondary | ICD-10-CM | POA: Diagnosis not present

## 2019-01-28 ENCOUNTER — Other Ambulatory Visit: Payer: Self-pay

## 2019-01-28 ENCOUNTER — Encounter (HOSPITAL_COMMUNITY): Payer: Self-pay | Admitting: Emergency Medicine

## 2019-01-28 ENCOUNTER — Emergency Department (HOSPITAL_COMMUNITY)
Admission: EM | Admit: 2019-01-28 | Discharge: 2019-01-29 | Disposition: A | Discharge status: Home / Self Care | Payer: Medicare HMO | Attending: Emergency Medicine | Admitting: Emergency Medicine

## 2019-01-28 DIAGNOSIS — W010XXA Fall on same level from slipping, tripping and stumbling without subsequent striking against object, initial encounter: Secondary | ICD-10-CM | POA: Insufficient documentation

## 2019-01-28 DIAGNOSIS — Z79899 Other long term (current) drug therapy: Secondary | ICD-10-CM | POA: Insufficient documentation

## 2019-01-28 DIAGNOSIS — Y9301 Activity, walking, marching and hiking: Secondary | ICD-10-CM | POA: Diagnosis not present

## 2019-01-28 DIAGNOSIS — Z23 Encounter for immunization: Secondary | ICD-10-CM | POA: Diagnosis not present

## 2019-01-28 DIAGNOSIS — W19XXXA Unspecified fall, initial encounter: Secondary | ICD-10-CM

## 2019-01-28 DIAGNOSIS — R030 Elevated blood-pressure reading, without diagnosis of hypertension: Secondary | ICD-10-CM | POA: Diagnosis not present

## 2019-01-28 DIAGNOSIS — Z95818 Presence of other cardiac implants and grafts: Secondary | ICD-10-CM | POA: Diagnosis not present

## 2019-01-28 DIAGNOSIS — Y92009 Unspecified place in unspecified non-institutional (private) residence as the place of occurrence of the external cause: Secondary | ICD-10-CM | POA: Insufficient documentation

## 2019-01-28 DIAGNOSIS — S0181XA Laceration without foreign body of other part of head, initial encounter: Secondary | ICD-10-CM | POA: Insufficient documentation

## 2019-01-28 DIAGNOSIS — S79911A Unspecified injury of right hip, initial encounter: Secondary | ICD-10-CM | POA: Diagnosis not present

## 2019-01-28 DIAGNOSIS — Z902 Acquired absence of lung [part of]: Secondary | ICD-10-CM | POA: Insufficient documentation

## 2019-01-28 DIAGNOSIS — Y999 Unspecified external cause status: Secondary | ICD-10-CM | POA: Insufficient documentation

## 2019-01-28 DIAGNOSIS — E039 Hypothyroidism, unspecified: Secondary | ICD-10-CM | POA: Diagnosis not present

## 2019-01-28 DIAGNOSIS — S0003XA Contusion of scalp, initial encounter: Secondary | ICD-10-CM | POA: Diagnosis not present

## 2019-01-28 DIAGNOSIS — S5011XA Contusion of right forearm, initial encounter: Secondary | ICD-10-CM | POA: Diagnosis not present

## 2019-01-28 DIAGNOSIS — S199XXA Unspecified injury of neck, initial encounter: Secondary | ICD-10-CM | POA: Diagnosis not present

## 2019-01-28 DIAGNOSIS — M542 Cervicalgia: Secondary | ICD-10-CM | POA: Diagnosis not present

## 2019-01-28 DIAGNOSIS — S50312A Abrasion of left elbow, initial encounter: Secondary | ICD-10-CM | POA: Insufficient documentation

## 2019-01-28 DIAGNOSIS — S01111A Laceration without foreign body of right eyelid and periocular area, initial encounter: Secondary | ICD-10-CM | POA: Diagnosis not present

## 2019-01-28 DIAGNOSIS — S0993XA Unspecified injury of face, initial encounter: Secondary | ICD-10-CM | POA: Diagnosis present

## 2019-01-28 DIAGNOSIS — Z85828 Personal history of other malignant neoplasm of skin: Secondary | ICD-10-CM | POA: Diagnosis not present

## 2019-01-28 DIAGNOSIS — S0990XA Unspecified injury of head, initial encounter: Secondary | ICD-10-CM | POA: Insufficient documentation

## 2019-01-28 DIAGNOSIS — M25551 Pain in right hip: Secondary | ICD-10-CM | POA: Diagnosis not present

## 2019-01-28 NOTE — ED Triage Notes (Signed)
Patient lost her balance and fell at home this evening , hit her head against the hardwood floor , denies LOC/ambulatory , alert and oriented , presents with right eyebrow laceration approx. 1/2" bleeding controlled/ hematoma at right temporal area , denies anticoagulant medication , patient added mild right hip pain .

## 2019-01-29 ENCOUNTER — Encounter (HOSPITAL_COMMUNITY): Payer: Self-pay | Admitting: Student

## 2019-01-29 ENCOUNTER — Emergency Department (HOSPITAL_COMMUNITY): Payer: Medicare HMO

## 2019-01-29 DIAGNOSIS — S79911A Unspecified injury of right hip, initial encounter: Secondary | ICD-10-CM | POA: Diagnosis not present

## 2019-01-29 DIAGNOSIS — S0003XA Contusion of scalp, initial encounter: Secondary | ICD-10-CM | POA: Diagnosis not present

## 2019-01-29 DIAGNOSIS — M25551 Pain in right hip: Secondary | ICD-10-CM | POA: Diagnosis not present

## 2019-01-29 DIAGNOSIS — S199XXA Unspecified injury of neck, initial encounter: Secondary | ICD-10-CM | POA: Diagnosis not present

## 2019-01-29 DIAGNOSIS — M542 Cervicalgia: Secondary | ICD-10-CM | POA: Diagnosis not present

## 2019-01-29 LAB — BASIC METABOLIC PANEL
Anion gap: 10 (ref 5–15)
BUN: 17 mg/dL (ref 8–23)
CO2: 23 mmol/L (ref 22–32)
Calcium: 9.3 mg/dL (ref 8.9–10.3)
Chloride: 105 mmol/L (ref 98–111)
Creatinine, Ser: 1.21 mg/dL — ABNORMAL HIGH (ref 0.44–1.00)
GFR calc Af Amer: 52 mL/min — ABNORMAL LOW (ref >60)
GFR calc non Af Amer: 45 mL/min — ABNORMAL LOW (ref >60)
Glucose, Bld: 89 mg/dL (ref 70–99)
Potassium: 3.8 mmol/L (ref 3.5–5.1)
Sodium: 138 mmol/L (ref 135–145)

## 2019-01-29 LAB — CBC WITH DIFFERENTIAL/PLATELET
Abs Immature Granulocytes: 0.03 10*3/uL (ref 0.00–0.07)
Basophils Absolute: 0.1 10*3/uL (ref 0.0–0.1)
Basophils Relative: 1 %
Eosinophils Absolute: 0.3 10*3/uL (ref 0.0–0.5)
Eosinophils Relative: 4 %
HCT: 37.1 % (ref 36.0–46.0)
Hemoglobin: 12.2 g/dL (ref 12.0–15.0)
Immature Granulocytes: 0 %
Lymphocytes Relative: 27 %
Lymphs Abs: 2.1 10*3/uL (ref 0.7–4.0)
MCH: 30.7 pg (ref 26.0–34.0)
MCHC: 32.9 g/dL (ref 30.0–36.0)
MCV: 93.5 fL (ref 80.0–100.0)
Monocytes Absolute: 0.6 10*3/uL (ref 0.1–1.0)
Monocytes Relative: 8 %
Neutro Abs: 4.8 10*3/uL (ref 1.7–7.7)
Neutrophils Relative %: 60 %
Platelets: 222 10*3/uL (ref 150–400)
RBC: 3.97 MIL/uL (ref 3.87–5.11)
RDW: 12.9 % (ref 11.5–15.5)
WBC: 7.8 10*3/uL (ref 4.0–10.5)
nRBC: 0 % (ref 0.0–0.2)

## 2019-01-29 MED ORDER — TETANUS-DIPHTH-ACELL PERTUSSIS 5-2.5-18.5 LF-MCG/0.5 IM SUSP
0.5000 mL | Freq: Once | INTRAMUSCULAR | Status: AC
  Administered 2019-01-29: 09:00:00 0.5 mL via INTRAMUSCULAR
  Filled 2019-01-29: qty 0.5

## 2019-01-29 MED ORDER — LIDOCAINE HCL (PF) 1 % IJ SOLN
5.0000 mL | Freq: Once | INTRAMUSCULAR | Status: AC
  Administered 2019-01-29: 5 mL
  Filled 2019-01-29: qty 5

## 2019-01-29 MED ORDER — ACETAMINOPHEN-CODEINE #3 300-30 MG PO TABS
1.0000 | ORAL_TABLET | Freq: Once | ORAL | Status: AC
  Administered 2019-01-29: 1 via ORAL
  Filled 2019-01-29: qty 1
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
## 2019-01-29 NOTE — ED Notes (Signed)
Patient verbalizes understanding of discharge instructions. Opportunity for questioning and answers were provided. Pt discharged from ED. 

## 2019-01-29 NOTE — ED Provider Notes (Signed)
Medical screening examination/treatment/procedure(s) were conducted as a shared visit with non-physician practitioner(s) and myself.  I personally evaluated the patient during the encounter.    Patient mechanical fall last night.  She got a lot of bruising and a laceration around the right brow.  She denies loss of consciousness.  Not on anticoagulant.  Patient is alert and appropriate with clear mental status.  She does have significant contusion and hematoma around the right eye but no significant lid edema.  Extraocular motions are normal and intact.  Linear laceration has been repaired by PA-C Putrucelli.  I agree with plan of management.   Natalie Shanks, MD 01/29/19 1000

## 2019-01-29 NOTE — Discharge Instructions (Signed)
You were seen in the emergency department today after a fall.  Your imaging did not show any fractures, dislocations, or brain bleed.  Your labs showed f that your kidney function was mildly elevated compared to prior, your creatinine is 1.31 today and has previously been 1.21.  Please have this rechecked by primary care.  Or a laceration. Your laceration was closed with 4 stitches. Please keep this area clean and dry for the next 24 hours, after 24 hours you may get this area wet, but avoid soaking the area. Keep the area covered as best possible especially when in the sun to help in minimizing scarring.   Your tetanus has been updated   You will need to have the stitches removed and the wound rechecked in 5 days. Please return to the emergency department, go to an urgent care, or see your primary care provider to have this performed. Return to the ER soon should you start to experience pus type drainage from the wound, redness around the wound, or fevers as this could indicate the area is infected, please return to the ER for any other worsening symptoms or concerns that you may have.
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
## 2019-02-04 DIAGNOSIS — H34831 Tributary (branch) retinal vein occlusion, right eye, with macular edema: Secondary | ICD-10-CM | POA: Diagnosis not present

## 2019-02-04 DIAGNOSIS — H04123 Dry eye syndrome of bilateral lacrimal glands: Secondary | ICD-10-CM | POA: Diagnosis not present

## 2019-02-04 DIAGNOSIS — S01119A Laceration without foreign body of unspecified eyelid and periocular area, initial encounter: Secondary | ICD-10-CM | POA: Diagnosis not present

## 2019-02-04 DIAGNOSIS — Z4802 Encounter for removal of sutures: Secondary | ICD-10-CM | POA: Diagnosis not present

## 2019-02-04 DIAGNOSIS — H35033 Hypertensive retinopathy, bilateral: Secondary | ICD-10-CM | POA: Diagnosis not present

## 2019-02-04 DIAGNOSIS — W19XXXA Unspecified fall, initial encounter: Secondary | ICD-10-CM | POA: Diagnosis not present

## 2019-02-04 DIAGNOSIS — S0003XA Contusion of scalp, initial encounter: Secondary | ICD-10-CM | POA: Diagnosis not present

## 2019-02-05 ENCOUNTER — Encounter (INDEPENDENT_AMBULATORY_CARE_PROVIDER_SITE_OTHER): Payer: Self-pay | Admitting: Ophthalmology

## 2019-02-05 ENCOUNTER — Ambulatory Visit (INDEPENDENT_AMBULATORY_CARE_PROVIDER_SITE_OTHER): Payer: Medicare HMO | Admitting: Ophthalmology

## 2019-02-05 DIAGNOSIS — H3581 Retinal edema: Secondary | ICD-10-CM

## 2019-02-05 DIAGNOSIS — Z961 Presence of intraocular lens: Secondary | ICD-10-CM

## 2019-02-05 DIAGNOSIS — H34831 Tributary (branch) retinal vein occlusion, right eye, with macular edema: Secondary | ICD-10-CM

## 2019-02-05 DIAGNOSIS — H26493 Other secondary cataract, bilateral: Secondary | ICD-10-CM

## 2019-02-05 DIAGNOSIS — L511 Stevens-Johnson syndrome: Secondary | ICD-10-CM

## 2019-02-05 DIAGNOSIS — H43822 Vitreomacular adhesion, left eye: Secondary | ICD-10-CM

## 2019-02-05 DIAGNOSIS — H35033 Hypertensive retinopathy, bilateral: Secondary | ICD-10-CM

## 2019-02-05 MED ORDER — AFLIBERCEPT 2MG/0.05ML IZ SOLN FOR KALEIDOSCOPE
2.0000 mg | INTRAVITREAL | Status: AC | PRN
  Administered 2019-02-05: 2 mg via INTRAVITREAL

## 2019-02-25 DIAGNOSIS — S93401A Sprain of unspecified ligament of right ankle, initial encounter: Secondary | ICD-10-CM | POA: Diagnosis not present

## 2019-03-05 ENCOUNTER — Other Ambulatory Visit: Payer: Self-pay | Admitting: *Deleted

## 2019-03-05 MED ORDER — PYRIDOSTIGMINE BROMIDE 60 MG PO TABS: 60.0000 mg | ORAL_TABLET | Freq: Three times a day (TID) | ORAL | 3 refills | Status: AC

## 2019-03-18 DIAGNOSIS — M25571 Pain in right ankle and joints of right foot: Secondary | ICD-10-CM | POA: Diagnosis not present

## 2019-03-25 DIAGNOSIS — E538 Deficiency of other specified B group vitamins: Secondary | ICD-10-CM | POA: Diagnosis not present

## 2019-03-26 DIAGNOSIS — E7849 Other hyperlipidemia: Secondary | ICD-10-CM | POA: Diagnosis not present

## 2019-03-26 DIAGNOSIS — E038 Other specified hypothyroidism: Secondary | ICD-10-CM | POA: Diagnosis not present

## 2019-04-01 DIAGNOSIS — E039 Hypothyroidism, unspecified: Secondary | ICD-10-CM | POA: Diagnosis not present

## 2019-04-01 DIAGNOSIS — Z1331 Encounter for screening for depression: Secondary | ICD-10-CM | POA: Diagnosis not present

## 2019-04-01 DIAGNOSIS — I951 Orthostatic hypotension: Secondary | ICD-10-CM | POA: Diagnosis not present

## 2019-04-01 DIAGNOSIS — Z0000 Encounter for general adult medical examination without abnormal findings: Secondary | ICD-10-CM | POA: Diagnosis not present

## 2019-04-01 DIAGNOSIS — G43909 Migraine, unspecified, not intractable, without status migrainosus: Secondary | ICD-10-CM | POA: Diagnosis not present

## 2019-04-01 DIAGNOSIS — G629 Polyneuropathy, unspecified: Secondary | ICD-10-CM | POA: Diagnosis not present

## 2019-04-01 DIAGNOSIS — G909 Disorder of the autonomic nervous system, unspecified: Secondary | ICD-10-CM | POA: Diagnosis not present

## 2019-04-01 DIAGNOSIS — E785 Hyperlipidemia, unspecified: Secondary | ICD-10-CM | POA: Diagnosis not present

## 2019-04-01 DIAGNOSIS — M25571 Pain in right ankle and joints of right foot: Secondary | ICD-10-CM | POA: Diagnosis not present

## 2019-04-01 DIAGNOSIS — E538 Deficiency of other specified B group vitamins: Secondary | ICD-10-CM | POA: Diagnosis not present

## 2019-04-01 DIAGNOSIS — N183 Chronic kidney disease, stage 3 unspecified: Secondary | ICD-10-CM | POA: Diagnosis not present

## 2019-04-01 DIAGNOSIS — Z1339 Encounter for screening examination for other mental health and behavioral disorders: Secondary | ICD-10-CM | POA: Diagnosis not present

## 2019-04-04 DIAGNOSIS — M2559 Pain in other specified joint: Secondary | ICD-10-CM | POA: Diagnosis not present

## 2019-04-04 DIAGNOSIS — M6281 Muscle weakness (generalized): Secondary | ICD-10-CM | POA: Diagnosis not present

## 2019-04-04 DIAGNOSIS — M62552 Muscle wasting and atrophy, not elsewhere classified, left thigh: Secondary | ICD-10-CM | POA: Diagnosis not present

## 2019-04-04 DIAGNOSIS — M62551 Muscle wasting and atrophy, not elsewhere classified, right thigh: Secondary | ICD-10-CM | POA: Diagnosis not present

## 2019-04-04 DIAGNOSIS — R262 Difficulty in walking, not elsewhere classified: Secondary | ICD-10-CM | POA: Diagnosis not present

## 2019-04-04 DIAGNOSIS — R278 Other lack of coordination: Secondary | ICD-10-CM | POA: Diagnosis not present

## 2019-04-16 ENCOUNTER — Encounter (INDEPENDENT_AMBULATORY_CARE_PROVIDER_SITE_OTHER): Payer: Medicare HMO | Admitting: Ophthalmology

## 2019-04-16 DIAGNOSIS — Z20818 Contact with and (suspected) exposure to other bacterial communicable diseases: Secondary | ICD-10-CM | POA: Diagnosis not present

## 2019-04-16 DIAGNOSIS — R197 Diarrhea, unspecified: Secondary | ICD-10-CM | POA: Diagnosis not present
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
## 2019-04-18 ENCOUNTER — Encounter (INDEPENDENT_AMBULATORY_CARE_PROVIDER_SITE_OTHER): Payer: Self-pay | Admitting: Ophthalmology

## 2019-04-18 ENCOUNTER — Ambulatory Visit (INDEPENDENT_AMBULATORY_CARE_PROVIDER_SITE_OTHER): Payer: Medicare HMO | Admitting: Ophthalmology

## 2019-04-18 ENCOUNTER — Institutional Professional Consult (permissible substitution): Payer: Medicare HMO | Admitting: Neurology

## 2019-04-18 DIAGNOSIS — Z961 Presence of intraocular lens: Secondary | ICD-10-CM | POA: Diagnosis not present

## 2019-04-18 DIAGNOSIS — H34831 Tributary (branch) retinal vein occlusion, right eye, with macular edema: Secondary | ICD-10-CM | POA: Diagnosis not present

## 2019-04-18 DIAGNOSIS — H43822 Vitreomacular adhesion, left eye: Secondary | ICD-10-CM

## 2019-04-18 DIAGNOSIS — L511 Stevens-Johnson syndrome: Secondary | ICD-10-CM

## 2019-04-18 DIAGNOSIS — H3581 Retinal edema: Secondary | ICD-10-CM

## 2019-04-18 DIAGNOSIS — H26493 Other secondary cataract, bilateral: Secondary | ICD-10-CM

## 2019-04-18 DIAGNOSIS — H35033 Hypertensive retinopathy, bilateral: Secondary | ICD-10-CM | POA: Diagnosis not present

## 2019-04-18 MED ORDER — AFLIBERCEPT 2MG/0.05ML IZ SOLN FOR KALEIDOSCOPE
2.0000 mg | INTRAVITREAL | Status: AC | PRN
  Administered 2019-04-18: 2 mg via INTRAVITREAL

## 2019-04-19 ENCOUNTER — Encounter (INDEPENDENT_AMBULATORY_CARE_PROVIDER_SITE_OTHER): Payer: Self-pay | Admitting: Ophthalmology

## 2019-04-23 DIAGNOSIS — H02403 Unspecified ptosis of bilateral eyelids: Secondary | ICD-10-CM | POA: Diagnosis not present

## 2019-05-08 DIAGNOSIS — M62552 Muscle wasting and atrophy, not elsewhere classified, left thigh: Secondary | ICD-10-CM | POA: Diagnosis not present

## 2019-05-08 DIAGNOSIS — R278 Other lack of coordination: Secondary | ICD-10-CM | POA: Diagnosis not present

## 2019-05-08 DIAGNOSIS — R262 Difficulty in walking, not elsewhere classified: Secondary | ICD-10-CM | POA: Diagnosis not present

## 2019-05-08 DIAGNOSIS — M6281 Muscle weakness (generalized): Secondary | ICD-10-CM | POA: Diagnosis not present

## 2019-05-08 DIAGNOSIS — M62551 Muscle wasting and atrophy, not elsewhere classified, right thigh: Secondary | ICD-10-CM | POA: Diagnosis not present

## 2019-05-08 DIAGNOSIS — M2559 Pain in other specified joint: Secondary | ICD-10-CM | POA: Diagnosis not present

## 2019-05-14 ENCOUNTER — Ambulatory Visit: Payer: Medicare HMO | Admitting: Neurology

## 2019-05-16 ENCOUNTER — Ambulatory Visit: Payer: Medicare HMO

## 2019-05-16 ENCOUNTER — Ambulatory Visit: Payer: Medicare HMO | Attending: Internal Medicine

## 2019-05-16 DIAGNOSIS — Z23 Encounter for immunization: Secondary | ICD-10-CM | POA: Insufficient documentation

## 2019-05-16 NOTE — Progress Notes (Signed)
   Covid-19 Vaccination Clinic  Name:  Christol Thetford    MRN: [id number] DOB: 05-Feb-1946  05/16/2019  Ms. Bueche was observed post Covid-19 immunization for 30 minutes based on pre-vaccination screening without incidence. She was provided with Vaccine Information Sheet and instruction to access the V-Safe system.   Ms. Med[place]in was instructed to call 911 with any severe reactions post vaccine: Marland Kitchen Difficulty breathing  . Swelling of your face and throat  . A fast heartbeat  . A bad rash all over your body  . Dizziness and weakness    Immunizations Administered    Name Date Dose VIS Date Route   Pfizer COVID-19 Vaccine 05/16/2019  3:11 PM 0.3 mL 03/01/2019 Intramuscular   Manufacturer: [place]   Lot: [id number]   [place]: [id number]

## 2019-05-20 DIAGNOSIS — M6281 Muscle weakness (generalized): Secondary | ICD-10-CM | POA: Diagnosis not present

## 2019-05-20 DIAGNOSIS — R278 Other lack of coordination: Secondary | ICD-10-CM | POA: Diagnosis not present

## 2019-05-20 DIAGNOSIS — M2559 Pain in other specified joint: Secondary | ICD-10-CM | POA: Diagnosis not present

## 2019-05-20 DIAGNOSIS — R262 Difficulty in walking, not elsewhere classified: Secondary | ICD-10-CM | POA: Diagnosis not present

## 2019-05-20 DIAGNOSIS — M62551 Muscle wasting and atrophy, not elsewhere classified, right thigh: Secondary | ICD-10-CM | POA: Diagnosis not present

## 2019-05-20 DIAGNOSIS — M62552 Muscle wasting and atrophy, not elsewhere classified, left thigh: Secondary | ICD-10-CM | POA: Diagnosis not present

## 2019-05-21 DIAGNOSIS — Z01812 Encounter for preprocedural laboratory examination: Secondary | ICD-10-CM | POA: Diagnosis not present

## 2019-05-21 DIAGNOSIS — Z20822 Contact with and (suspected) exposure to covid-19: Secondary | ICD-10-CM | POA: Diagnosis not present

## 2019-05-21 DIAGNOSIS — H02403 Unspecified ptosis of bilateral eyelids: Secondary | ICD-10-CM | POA: Diagnosis not present

## 2019-05-22 DIAGNOSIS — M2559 Pain in other specified joint: Secondary | ICD-10-CM | POA: Diagnosis not present

## 2019-05-22 DIAGNOSIS — R262 Difficulty in walking, not elsewhere classified: Secondary | ICD-10-CM | POA: Diagnosis not present

## 2019-05-22 DIAGNOSIS — R278 Other lack of coordination: Secondary | ICD-10-CM | POA: Diagnosis not present

## 2019-05-22 DIAGNOSIS — M6281 Muscle weakness (generalized): Secondary | ICD-10-CM | POA: Diagnosis not present

## 2019-05-22 DIAGNOSIS — M62552 Muscle wasting and atrophy, not elsewhere classified, left thigh: Secondary | ICD-10-CM | POA: Diagnosis not present

## 2019-05-22 DIAGNOSIS — M62551 Muscle wasting and atrophy, not elsewhere classified, right thigh: Secondary | ICD-10-CM | POA: Diagnosis not present

## 2019-05-27 DIAGNOSIS — H02403 Unspecified ptosis of bilateral eyelids: Secondary | ICD-10-CM | POA: Diagnosis not present

## 2019-05-27 DIAGNOSIS — H02413 Mechanical ptosis of bilateral eyelids: Secondary | ICD-10-CM | POA: Diagnosis not present

## 2019-06-06 DIAGNOSIS — Z9889 Other specified postprocedural states: Secondary | ICD-10-CM | POA: Diagnosis not present

## 2019-06-06 DIAGNOSIS — H02403 Unspecified ptosis of bilateral eyelids: Secondary | ICD-10-CM | POA: Diagnosis not present

## 2019-06-07 DIAGNOSIS — M62551 Muscle wasting and atrophy, not elsewhere classified, right thigh: Secondary | ICD-10-CM | POA: Diagnosis not present

## 2019-06-07 DIAGNOSIS — R262 Difficulty in walking, not elsewhere classified: Secondary | ICD-10-CM | POA: Diagnosis not present

## 2019-06-07 DIAGNOSIS — R278 Other lack of coordination: Secondary | ICD-10-CM | POA: Diagnosis not present

## 2019-06-07 DIAGNOSIS — M62552 Muscle wasting and atrophy, not elsewhere classified, left thigh: Secondary | ICD-10-CM | POA: Diagnosis not present

## 2019-06-07 DIAGNOSIS — M2559 Pain in other specified joint: Secondary | ICD-10-CM | POA: Diagnosis not present

## 2019-06-07 DIAGNOSIS — M6281 Muscle weakness (generalized): Secondary | ICD-10-CM | POA: Diagnosis not present

## 2019-06-11 ENCOUNTER — Ambulatory Visit: Payer: Medicare HMO | Attending: Internal Medicine

## 2019-06-11 DIAGNOSIS — Z23 Encounter for immunization: Secondary | ICD-10-CM

## 2019-06-11 NOTE — Progress Notes (Signed)
   Covid-19 Vaccination Clinic  Name:  Natalie Johnson    MRN: [id number] DOB: [birth year]  06/11/2019  [name] was observed post Covid-19 immunization for 15 minutes without incident. She was provided with Vaccine Information Sheet and instruction to access the V-Safe system.   [name] was instructed to call 911 with any severe reactions post vaccine: Marland Kitchen Difficulty breathing  . Swelling of face and throat  . A fast heartbeat  . A bad rash all over body  . Dizziness and weakness   Immunizations Administered    Name Date Dose VIS Date Route   Pfizer COVID-19 Vaccine 06/11/2019  4:14 PM 0.3 mL 03/01/2019 Intramuscular   Manufacturer: [place]   Lot: [id number]   [place]: [id number]

## 2019-06-14 ENCOUNTER — Encounter (INDEPENDENT_AMBULATORY_CARE_PROVIDER_SITE_OTHER): Payer: Medicare HMO | Admitting: Ophthalmology

## 2019-06-21 ENCOUNTER — Encounter (INDEPENDENT_AMBULATORY_CARE_PROVIDER_SITE_OTHER): Payer: Medicare HMO | Admitting: Ophthalmology
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
## 2019-06-26 ENCOUNTER — Ambulatory Visit (INDEPENDENT_AMBULATORY_CARE_PROVIDER_SITE_OTHER): Payer: Medicare HMO | Admitting: Ophthalmology

## 2019-06-26 ENCOUNTER — Encounter (INDEPENDENT_AMBULATORY_CARE_PROVIDER_SITE_OTHER): Payer: Self-pay | Admitting: Ophthalmology

## 2019-06-26 DIAGNOSIS — H43822 Vitreomacular adhesion, left eye: Secondary | ICD-10-CM

## 2019-06-26 DIAGNOSIS — Z961 Presence of intraocular lens: Secondary | ICD-10-CM | POA: Diagnosis not present

## 2019-06-26 DIAGNOSIS — H35033 Hypertensive retinopathy, bilateral: Secondary | ICD-10-CM | POA: Diagnosis not present

## 2019-06-26 DIAGNOSIS — H3581 Retinal edema: Secondary | ICD-10-CM

## 2019-06-26 DIAGNOSIS — H34831 Tributary (branch) retinal vein occlusion, right eye, with macular edema: Secondary | ICD-10-CM | POA: Diagnosis not present

## 2019-06-26 DIAGNOSIS — L511 Stevens-Johnson syndrome: Secondary | ICD-10-CM | POA: Diagnosis not present

## 2019-06-26 MED ORDER — AFLIBERCEPT 2MG/0.05ML IZ SOLN FOR KALEIDOSCOPE
2.0000 mg | INTRAVITREAL | Status: AC | PRN
  Administered 2019-06-26: 2 mg via INTRAVITREAL

## 2019-06-27 DIAGNOSIS — R262 Difficulty in walking, not elsewhere classified: Secondary | ICD-10-CM | POA: Diagnosis not present

## 2019-06-27 DIAGNOSIS — M62551 Muscle wasting and atrophy, not elsewhere classified, right thigh: Secondary | ICD-10-CM | POA: Diagnosis not present

## 2019-06-27 DIAGNOSIS — R278 Other lack of coordination: Secondary | ICD-10-CM | POA: Diagnosis not present

## 2019-06-27 DIAGNOSIS — M62552 Muscle wasting and atrophy, not elsewhere classified, left thigh: Secondary | ICD-10-CM | POA: Diagnosis not present

## 2019-06-27 DIAGNOSIS — M6281 Muscle weakness (generalized): Secondary | ICD-10-CM | POA: Diagnosis not present

## 2019-06-27 DIAGNOSIS — M2559 Pain in other specified joint: Secondary | ICD-10-CM | POA: Diagnosis not present

## 2019-08-20 DIAGNOSIS — H02403 Unspecified ptosis of bilateral eyelids: Secondary | ICD-10-CM | POA: Diagnosis not present

## 2019-08-20 DIAGNOSIS — Z9889 Other specified postprocedural states: Secondary | ICD-10-CM | POA: Diagnosis not present

## 2019-08-30 DIAGNOSIS — E785 Hyperlipidemia, unspecified: Secondary | ICD-10-CM | POA: Diagnosis not present

## 2019-08-30 DIAGNOSIS — E039 Hypothyroidism, unspecified: Secondary | ICD-10-CM | POA: Diagnosis not present

## 2019-08-30 DIAGNOSIS — R2681 Unsteadiness on feet: Secondary | ICD-10-CM | POA: Diagnosis not present

## 2019-08-30 DIAGNOSIS — M797 Fibromyalgia: Secondary | ICD-10-CM | POA: Diagnosis not present

## 2019-08-30 DIAGNOSIS — G909 Disorder of the autonomic nervous system, unspecified: Secondary | ICD-10-CM | POA: Diagnosis not present

## 2019-08-30 DIAGNOSIS — R1310 Dysphagia, unspecified: Secondary | ICD-10-CM | POA: Diagnosis not present

## 2019-08-30 DIAGNOSIS — G629 Polyneuropathy, unspecified: Secondary | ICD-10-CM | POA: Diagnosis not present
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
## 2019-09-04 ENCOUNTER — Other Ambulatory Visit: Payer: Self-pay

## 2019-09-04 ENCOUNTER — Ambulatory Visit (INDEPENDENT_AMBULATORY_CARE_PROVIDER_SITE_OTHER): Payer: Medicare HMO | Admitting: Ophthalmology

## 2019-09-04 DIAGNOSIS — H43822 Vitreomacular adhesion, left eye: Secondary | ICD-10-CM | POA: Diagnosis not present

## 2019-09-04 DIAGNOSIS — H34831 Tributary (branch) retinal vein occlusion, right eye, with macular edema: Secondary | ICD-10-CM

## 2019-09-04 DIAGNOSIS — Z961 Presence of intraocular lens: Secondary | ICD-10-CM

## 2019-09-04 DIAGNOSIS — H3581 Retinal edema: Secondary | ICD-10-CM | POA: Diagnosis not present

## 2019-09-04 DIAGNOSIS — H35033 Hypertensive retinopathy, bilateral: Secondary | ICD-10-CM

## 2019-09-04 DIAGNOSIS — H26493 Other secondary cataract, bilateral: Secondary | ICD-10-CM

## 2019-09-04 DIAGNOSIS — L511 Stevens-Johnson syndrome: Secondary | ICD-10-CM | POA: Diagnosis not present

## 2019-09-05 ENCOUNTER — Encounter (INDEPENDENT_AMBULATORY_CARE_PROVIDER_SITE_OTHER): Payer: Self-pay | Admitting: Ophthalmology

## 2019-09-05 MED ORDER — AFLIBERCEPT 2MG/0.05ML IZ SOLN FOR KALEIDOSCOPE
2.0000 mg | INTRAVITREAL | Status: AC | PRN
  Administered 2019-09-05: 2 mg via INTRAVITREAL

## 2019-09-30 ENCOUNTER — Ambulatory Visit (INDEPENDENT_AMBULATORY_CARE_PROVIDER_SITE_OTHER): Payer: Medicare HMO | Admitting: Ophthalmology

## 2019-09-30 ENCOUNTER — Encounter (INDEPENDENT_AMBULATORY_CARE_PROVIDER_SITE_OTHER): Payer: Self-pay | Admitting: Ophthalmology

## 2019-09-30 ENCOUNTER — Other Ambulatory Visit: Payer: Self-pay

## 2019-09-30 DIAGNOSIS — H43822 Vitreomacular adhesion, left eye: Secondary | ICD-10-CM | POA: Diagnosis not present

## 2019-09-30 DIAGNOSIS — H3581 Retinal edema: Secondary | ICD-10-CM

## 2019-09-30 DIAGNOSIS — H34831 Tributary (branch) retinal vein occlusion, right eye, with macular edema: Secondary | ICD-10-CM | POA: Diagnosis not present

## 2019-09-30 DIAGNOSIS — H35033 Hypertensive retinopathy, bilateral: Secondary | ICD-10-CM

## 2019-09-30 DIAGNOSIS — Z961 Presence of intraocular lens: Secondary | ICD-10-CM

## 2019-09-30 DIAGNOSIS — H26493 Other secondary cataract, bilateral: Secondary | ICD-10-CM

## 2019-09-30 DIAGNOSIS — L511 Stevens-Johnson syndrome: Secondary | ICD-10-CM | POA: Diagnosis not present
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
## 2019-10-21 ENCOUNTER — Other Ambulatory Visit: Payer: Self-pay

## 2019-10-21 ENCOUNTER — Emergency Department (HOSPITAL_COMMUNITY): Payer: Medicare HMO

## 2019-10-21 ENCOUNTER — Emergency Department (HOSPITAL_COMMUNITY)
Admission: EM | Admit: 2019-10-21 | Discharge: 2019-10-22 | Disposition: A | Discharge status: Home / Self Care | Payer: Medicare HMO | Attending: Emergency Medicine | Admitting: Emergency Medicine

## 2019-10-21 DIAGNOSIS — Y999 Unspecified external cause status: Secondary | ICD-10-CM | POA: Diagnosis not present

## 2019-10-21 DIAGNOSIS — S52514A Nondisplaced fracture of right radial styloid process, initial encounter for closed fracture: Secondary | ICD-10-CM

## 2019-10-21 DIAGNOSIS — C4491 Basal cell carcinoma of skin, unspecified: Secondary | ICD-10-CM | POA: Insufficient documentation

## 2019-10-21 DIAGNOSIS — E039 Hypothyroidism, unspecified: Secondary | ICD-10-CM | POA: Insufficient documentation

## 2019-10-21 DIAGNOSIS — Y9289 Other specified places as the place of occurrence of the external cause: Secondary | ICD-10-CM | POA: Insufficient documentation

## 2019-10-21 DIAGNOSIS — W010XXA Fall on same level from slipping, tripping and stumbling without subsequent striking against object, initial encounter: Secondary | ICD-10-CM | POA: Insufficient documentation

## 2019-10-21 DIAGNOSIS — Y9389 Activity, other specified: Secondary | ICD-10-CM | POA: Diagnosis not present

## 2019-10-21 DIAGNOSIS — I6782 Cerebral ischemia: Secondary | ICD-10-CM | POA: Diagnosis not present

## 2019-10-21 DIAGNOSIS — S0990XA Unspecified injury of head, initial encounter: Secondary | ICD-10-CM | POA: Diagnosis not present

## 2019-10-21 DIAGNOSIS — S0181XA Laceration without foreign body of other part of head, initial encounter: Secondary | ICD-10-CM | POA: Insufficient documentation

## 2019-10-21 NOTE — ED Triage Notes (Signed)
Pt has a condition which causes her to fall frequently. However she was walking without her crutches and fell today, has lac and bruising to R face, also c/o R wrist pain. No dizziness prior to fall, no LOC, no blood thinners.

## 2019-10-22 DIAGNOSIS — M25531 Pain in right wrist: Secondary | ICD-10-CM | POA: Diagnosis not present

## 2019-10-22 DIAGNOSIS — S52514A Nondisplaced fracture of right radial styloid process, initial encounter for closed fracture: Secondary | ICD-10-CM | POA: Diagnosis not present

## 2019-10-22 MED ORDER — LIDOCAINE-EPINEPHRINE 1 %-1:100000 IJ SOLN
10.0000 mL | Freq: Once | INTRAMUSCULAR | Status: AC
  Administered 2019-10-22: 10 mL via INTRADERMAL
  Filled 2019-10-22: qty 1

## 2019-10-22 NOTE — Progress Notes (Signed)
Orthopedic Tech Progress Note Patient Details:  Natalie Johnson 08-25-1945 [id number]  Ortho Devices Type of Ortho Device: Arm sling, [place]ong splint Ortho Device/Splint Location: rue Ortho Device/Splint Interventions: Ordered, Application, Adjustment   Post Interventions Patient Tolerated: Well Instructions Provided: Care of device, Adjustment of device   Karolee Stamps 10/22/2019, 3:51 AM
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
## 2019-10-22 NOTE — Discharge Instructions (Signed)
Return for her redness drainage or if you develop a fever.  Water can run over the area but do not scrub it.  Do not fully immerse underwater.  Follow-up with orthopedics in the office for your radial styloid fracture.  If you cannot be seen by your orthopedist I have attached information for the orthopedist on call today.

## 2019-11-05 DIAGNOSIS — M25531 Pain in right wrist: Secondary | ICD-10-CM | POA: Diagnosis not present
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
## 2019-11-13 ENCOUNTER — Ambulatory Visit (INDEPENDENT_AMBULATORY_CARE_PROVIDER_SITE_OTHER): Payer: Medicare HMO | Admitting: Ophthalmology

## 2019-11-13 ENCOUNTER — Encounter (INDEPENDENT_AMBULATORY_CARE_PROVIDER_SITE_OTHER): Payer: Self-pay | Admitting: Ophthalmology

## 2019-11-13 ENCOUNTER — Other Ambulatory Visit: Payer: Self-pay

## 2019-11-13 DIAGNOSIS — L511 Stevens-Johnson syndrome: Secondary | ICD-10-CM

## 2019-11-13 DIAGNOSIS — H34831 Tributary (branch) retinal vein occlusion, right eye, with macular edema: Secondary | ICD-10-CM | POA: Diagnosis not present

## 2019-11-13 DIAGNOSIS — Z961 Presence of intraocular lens: Secondary | ICD-10-CM

## 2019-11-13 DIAGNOSIS — H26493 Other secondary cataract, bilateral: Secondary | ICD-10-CM | POA: Diagnosis not present

## 2019-11-13 DIAGNOSIS — H43822 Vitreomacular adhesion, left eye: Secondary | ICD-10-CM | POA: Diagnosis not present

## 2019-11-13 DIAGNOSIS — H3581 Retinal edema: Secondary | ICD-10-CM | POA: Diagnosis not present

## 2019-11-13 DIAGNOSIS — H35033 Hypertensive retinopathy, bilateral: Secondary | ICD-10-CM

## 2019-11-14 ENCOUNTER — Encounter (INDEPENDENT_AMBULATORY_CARE_PROVIDER_SITE_OTHER): Payer: Self-pay | Admitting: Ophthalmology

## 2019-11-14 MED ORDER — AFLIBERCEPT 2MG/0.05ML IZ SOLN FOR KALEIDOSCOPE
2.0000 mg | INTRAVITREAL | Status: AC | PRN
  Administered 2019-11-14: 2 mg via INTRAVITREAL

## 2019-11-24 ENCOUNTER — Emergency Department (HOSPITAL_COMMUNITY)
Admission: EM | Admit: 2019-11-24 | Discharge: 2019-11-25 | Disposition: A | Discharge status: Home / Self Care | Payer: Medicare HMO | Attending: Emergency Medicine | Admitting: Emergency Medicine

## 2019-11-24 ENCOUNTER — Encounter (HOSPITAL_COMMUNITY): Payer: Self-pay | Admitting: Emergency Medicine

## 2019-11-24 ENCOUNTER — Other Ambulatory Visit: Payer: Self-pay

## 2019-11-24 DIAGNOSIS — S8011XA Contusion of right lower leg, initial encounter: Secondary | ICD-10-CM | POA: Diagnosis not present

## 2019-11-24 DIAGNOSIS — S86811A Strain of other muscle(s) and tendon(s) at lower leg level, right leg, initial encounter: Secondary | ICD-10-CM | POA: Diagnosis not present

## 2019-11-24 DIAGNOSIS — Y9389 Activity, other specified: Secondary | ICD-10-CM | POA: Diagnosis not present

## 2019-11-24 DIAGNOSIS — S0083XA Contusion of other part of head, initial encounter: Secondary | ICD-10-CM | POA: Insufficient documentation

## 2019-11-24 DIAGNOSIS — S86911A Strain of unspecified muscle(s) and tendon(s) at lower leg level, right leg, initial encounter: Secondary | ICD-10-CM | POA: Insufficient documentation

## 2019-11-24 DIAGNOSIS — Y999 Unspecified external cause status: Secondary | ICD-10-CM | POA: Diagnosis not present

## 2019-11-24 DIAGNOSIS — Y9289 Other specified places as the place of occurrence of the external cause: Secondary | ICD-10-CM | POA: Insufficient documentation

## 2019-11-24 DIAGNOSIS — W228XXA Striking against or struck by other objects, initial encounter: Secondary | ICD-10-CM | POA: Diagnosis not present

## 2019-11-24 DIAGNOSIS — S01512A Laceration without foreign body of oral cavity, initial encounter: Secondary | ICD-10-CM | POA: Diagnosis not present

## 2019-11-24 DIAGNOSIS — C4490 Unspecified malignant neoplasm of skin, unspecified: Secondary | ICD-10-CM | POA: Diagnosis not present

## 2019-11-24 DIAGNOSIS — W19XXXA Unspecified fall, initial encounter: Secondary | ICD-10-CM

## 2019-11-24 DIAGNOSIS — E039 Hypothyroidism, unspecified: Secondary | ICD-10-CM | POA: Diagnosis not present

## 2019-11-24 DIAGNOSIS — Z79890 Hormone replacement therapy (postmenopausal): Secondary | ICD-10-CM | POA: Diagnosis not present

## 2019-11-24 DIAGNOSIS — S0990XA Unspecified injury of head, initial encounter: Secondary | ICD-10-CM | POA: Diagnosis present

## 2019-11-24 DIAGNOSIS — Z79899 Other long term (current) drug therapy: Secondary | ICD-10-CM | POA: Diagnosis not present

## 2019-11-24 NOTE — ED Triage Notes (Addendum)
Pt fell just prior to arrival.  States she normally ambulates with 2 crutches and was only using 1.  C/o hematoma to R lower leg.  Reports upper tooth loose, lac to R lower mouth from tooth, and R cheek with bruising.  Bleeding controlled.  Denies LOC.  Denies neck and back pain.  No blood thinners.

## 2019-11-25 ENCOUNTER — Emergency Department (HOSPITAL_COMMUNITY): Payer: Medicare HMO

## 2019-11-25 DIAGNOSIS — S01512A Laceration without foreign body of oral cavity, initial encounter: Secondary | ICD-10-CM | POA: Diagnosis not present

## 2019-11-25 DIAGNOSIS — S8011XA Contusion of right lower leg, initial encounter: Secondary | ICD-10-CM | POA: Diagnosis not present

## 2019-11-25 DIAGNOSIS — S0990XA Unspecified injury of head, initial encounter: Secondary | ICD-10-CM | POA: Diagnosis not present
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
## 2019-11-25 NOTE — Discharge Instructions (Addendum)
Follow-up with your primary doctor regarding the frequent falls.  Return to ER if you develop any additional falls, chest pain, difficulty breathing or other new concerning symptom.

## 2019-12-10 DIAGNOSIS — M25531 Pain in right wrist: Secondary | ICD-10-CM | POA: Diagnosis not present
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
## 2020-01-08 ENCOUNTER — Ambulatory Visit (INDEPENDENT_AMBULATORY_CARE_PROVIDER_SITE_OTHER): Payer: Medicare HMO | Admitting: Ophthalmology

## 2020-01-08 ENCOUNTER — Encounter (INDEPENDENT_AMBULATORY_CARE_PROVIDER_SITE_OTHER): Payer: Self-pay | Admitting: Ophthalmology

## 2020-01-08 ENCOUNTER — Other Ambulatory Visit: Payer: Self-pay

## 2020-01-08 DIAGNOSIS — L511 Stevens-Johnson syndrome: Secondary | ICD-10-CM | POA: Diagnosis not present

## 2020-01-08 DIAGNOSIS — H26493 Other secondary cataract, bilateral: Secondary | ICD-10-CM

## 2020-01-08 DIAGNOSIS — H3581 Retinal edema: Secondary | ICD-10-CM | POA: Diagnosis not present

## 2020-01-08 DIAGNOSIS — H35033 Hypertensive retinopathy, bilateral: Secondary | ICD-10-CM | POA: Diagnosis not present

## 2020-01-08 DIAGNOSIS — Z961 Presence of intraocular lens: Secondary | ICD-10-CM

## 2020-01-08 DIAGNOSIS — H34831 Tributary (branch) retinal vein occlusion, right eye, with macular edema: Secondary | ICD-10-CM

## 2020-01-08 DIAGNOSIS — H43822 Vitreomacular adhesion, left eye: Secondary | ICD-10-CM

## 2020-01-08 MED ORDER — AFLIBERCEPT 2MG/0.05ML IZ SOLN FOR KALEIDOSCOPE
2.0000 mg | INTRAVITREAL | Status: AC | PRN
  Administered 2020-01-08: 2 mg via INTRAVITREAL

## 2020-03-11 ENCOUNTER — Encounter (INDEPENDENT_AMBULATORY_CARE_PROVIDER_SITE_OTHER): Payer: Medicare HMO | Admitting: Ophthalmology
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
## 2020-03-20 ENCOUNTER — Other Ambulatory Visit: Payer: Self-pay

## 2020-03-20 ENCOUNTER — Ambulatory Visit (INDEPENDENT_AMBULATORY_CARE_PROVIDER_SITE_OTHER): Payer: Medicare HMO | Admitting: Ophthalmology

## 2020-03-20 ENCOUNTER — Encounter (INDEPENDENT_AMBULATORY_CARE_PROVIDER_SITE_OTHER): Payer: Self-pay | Admitting: Ophthalmology

## 2020-03-20 DIAGNOSIS — H35033 Hypertensive retinopathy, bilateral: Secondary | ICD-10-CM | POA: Diagnosis not present

## 2020-03-20 DIAGNOSIS — H3581 Retinal edema: Secondary | ICD-10-CM

## 2020-03-20 DIAGNOSIS — H26493 Other secondary cataract, bilateral: Secondary | ICD-10-CM

## 2020-03-20 DIAGNOSIS — L511 Stevens-Johnson syndrome: Secondary | ICD-10-CM

## 2020-03-20 DIAGNOSIS — H34831 Tributary (branch) retinal vein occlusion, right eye, with macular edema: Secondary | ICD-10-CM

## 2020-03-20 DIAGNOSIS — Z961 Presence of intraocular lens: Secondary | ICD-10-CM

## 2020-03-20 DIAGNOSIS — H43822 Vitreomacular adhesion, left eye: Secondary | ICD-10-CM

## 2020-03-20 MED ORDER — AFLIBERCEPT 2MG/0.05ML IZ SOLN FOR KALEIDOSCOPE
2.0000 mg | INTRAVITREAL | Status: AC | PRN
  Administered 2020-03-20: 2 mg via INTRAVITREAL

## 2020-03-30 DIAGNOSIS — E785 Hyperlipidemia, unspecified: Secondary | ICD-10-CM | POA: Diagnosis not present

## 2020-03-30 DIAGNOSIS — E039 Hypothyroidism, unspecified: Secondary | ICD-10-CM | POA: Diagnosis not present

## 2020-04-21 DIAGNOSIS — M797 Fibromyalgia: Secondary | ICD-10-CM | POA: Diagnosis not present

## 2020-04-21 DIAGNOSIS — I951 Orthostatic hypotension: Secondary | ICD-10-CM | POA: Diagnosis not present

## 2020-04-21 DIAGNOSIS — R2681 Unsteadiness on feet: Secondary | ICD-10-CM | POA: Diagnosis not present

## 2020-04-21 DIAGNOSIS — E785 Hyperlipidemia, unspecified: Secondary | ICD-10-CM | POA: Diagnosis not present

## 2020-04-21 DIAGNOSIS — M25571 Pain in right ankle and joints of right foot: Secondary | ICD-10-CM | POA: Diagnosis not present

## 2020-04-21 DIAGNOSIS — Z1331 Encounter for screening for depression: Secondary | ICD-10-CM | POA: Diagnosis not present

## 2020-04-21 DIAGNOSIS — G250 Essential tremor: Secondary | ICD-10-CM | POA: Diagnosis not present

## 2020-04-21 DIAGNOSIS — G629 Polyneuropathy, unspecified: Secondary | ICD-10-CM | POA: Diagnosis not present

## 2020-04-21 DIAGNOSIS — Z0000 Encounter for general adult medical examination without abnormal findings: Secondary | ICD-10-CM | POA: Diagnosis not present
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
## 2020-05-01 ENCOUNTER — Encounter (INDEPENDENT_AMBULATORY_CARE_PROVIDER_SITE_OTHER): Payer: Self-pay | Admitting: Ophthalmology

## 2020-05-01 ENCOUNTER — Other Ambulatory Visit: Payer: Self-pay

## 2020-05-01 ENCOUNTER — Ambulatory Visit (INDEPENDENT_AMBULATORY_CARE_PROVIDER_SITE_OTHER): Payer: Medicare HMO | Admitting: Ophthalmology

## 2020-05-01 DIAGNOSIS — H43822 Vitreomacular adhesion, left eye: Secondary | ICD-10-CM

## 2020-05-01 DIAGNOSIS — H35033 Hypertensive retinopathy, bilateral: Secondary | ICD-10-CM

## 2020-05-01 DIAGNOSIS — L511 Stevens-Johnson syndrome: Secondary | ICD-10-CM | POA: Diagnosis not present

## 2020-05-01 DIAGNOSIS — Z961 Presence of intraocular lens: Secondary | ICD-10-CM | POA: Diagnosis not present

## 2020-05-01 DIAGNOSIS — H26493 Other secondary cataract, bilateral: Secondary | ICD-10-CM | POA: Diagnosis not present

## 2020-05-01 DIAGNOSIS — H3581 Retinal edema: Secondary | ICD-10-CM | POA: Diagnosis not present

## 2020-05-01 DIAGNOSIS — H34831 Tributary (branch) retinal vein occlusion, right eye, with macular edema: Secondary | ICD-10-CM

## 2020-05-01 MED ORDER — AFLIBERCEPT 2MG/0.05ML IZ SOLN FOR KALEIDOSCOPE
2.0000 mg | INTRAVITREAL | Status: AC | PRN
  Administered 2020-05-01: 2 mg via INTRAVITREAL

## 2020-05-25 DIAGNOSIS — R2681 Unsteadiness on feet: Secondary | ICD-10-CM | POA: Diagnosis not present

## 2020-05-25 DIAGNOSIS — M797 Fibromyalgia: Secondary | ICD-10-CM | POA: Diagnosis not present

## 2020-05-25 DIAGNOSIS — G629 Polyneuropathy, unspecified: Secondary | ICD-10-CM | POA: Diagnosis not present

## 2020-05-25 DIAGNOSIS — E039 Hypothyroidism, unspecified: Secondary | ICD-10-CM | POA: Diagnosis not present

## 2020-05-25 DIAGNOSIS — G909 Disorder of the autonomic nervous system, unspecified: Secondary | ICD-10-CM | POA: Diagnosis not present

## 2020-05-25 DIAGNOSIS — I951 Orthostatic hypotension: Secondary | ICD-10-CM | POA: Diagnosis not present

## 2020-06-03 ENCOUNTER — Encounter: Payer: Self-pay | Admitting: Neurology
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
## 2020-06-26 ENCOUNTER — Ambulatory Visit (INDEPENDENT_AMBULATORY_CARE_PROVIDER_SITE_OTHER): Payer: Medicare HMO | Admitting: Ophthalmology

## 2020-06-26 ENCOUNTER — Encounter (INDEPENDENT_AMBULATORY_CARE_PROVIDER_SITE_OTHER): Payer: Self-pay | Admitting: Ophthalmology

## 2020-06-26 ENCOUNTER — Other Ambulatory Visit: Payer: Self-pay

## 2020-06-26 DIAGNOSIS — H3581 Retinal edema: Secondary | ICD-10-CM

## 2020-06-26 DIAGNOSIS — H34831 Tributary (branch) retinal vein occlusion, right eye, with macular edema: Secondary | ICD-10-CM

## 2020-06-26 DIAGNOSIS — H26493 Other secondary cataract, bilateral: Secondary | ICD-10-CM | POA: Diagnosis not present

## 2020-06-26 DIAGNOSIS — Z961 Presence of intraocular lens: Secondary | ICD-10-CM | POA: Diagnosis not present

## 2020-06-26 DIAGNOSIS — H43822 Vitreomacular adhesion, left eye: Secondary | ICD-10-CM | POA: Diagnosis not present

## 2020-06-26 DIAGNOSIS — H35033 Hypertensive retinopathy, bilateral: Secondary | ICD-10-CM | POA: Diagnosis not present

## 2020-06-26 DIAGNOSIS — L511 Stevens-Johnson syndrome: Secondary | ICD-10-CM

## 2020-06-26 MED ORDER — AFLIBERCEPT 2MG/0.05ML IZ SOLN FOR KALEIDOSCOPE
2.0000 mg | INTRAVITREAL | Status: AC | PRN
  Administered 2020-06-26: 2 mg via INTRAVITREAL

## 2020-07-09 ENCOUNTER — Emergency Department (HOSPITAL_COMMUNITY)
Admission: EM | Admit: 2020-07-09 | Discharge: 2020-07-10 | Disposition: A | Discharge status: Home / Self Care | Payer: Medicare HMO | Attending: Emergency Medicine | Admitting: Emergency Medicine

## 2020-07-09 ENCOUNTER — Other Ambulatory Visit: Payer: Self-pay

## 2020-07-09 DIAGNOSIS — Z79899 Other long term (current) drug therapy: Secondary | ICD-10-CM | POA: Insufficient documentation

## 2020-07-09 DIAGNOSIS — R55 Syncope and collapse: Secondary | ICD-10-CM | POA: Diagnosis not present

## 2020-07-09 DIAGNOSIS — N390 Urinary tract infection, site not specified: Secondary | ICD-10-CM

## 2020-07-09 DIAGNOSIS — N289 Disorder of kidney and ureter, unspecified: Secondary | ICD-10-CM | POA: Diagnosis not present

## 2020-07-09 DIAGNOSIS — R4020 Unspecified coma: Secondary | ICD-10-CM | POA: Diagnosis not present

## 2020-07-09 DIAGNOSIS — R110 Nausea: Secondary | ICD-10-CM | POA: Diagnosis not present

## 2020-07-09 DIAGNOSIS — Z85828 Personal history of other malignant neoplasm of skin: Secondary | ICD-10-CM | POA: Diagnosis not present

## 2020-07-09 DIAGNOSIS — E039 Hypothyroidism, unspecified: Secondary | ICD-10-CM | POA: Insufficient documentation

## 2020-07-09 DIAGNOSIS — I959 Hypotension, unspecified: Secondary | ICD-10-CM | POA: Diagnosis not present

## 2020-07-09 LAB — BASIC METABOLIC PANEL
Anion gap: 12 (ref 5–15)
BUN: 37 mg/dL — ABNORMAL HIGH (ref 8–23)
CO2: 19 mmol/L — ABNORMAL LOW (ref 22–32)
Calcium: 9.7 mg/dL (ref 8.9–10.3)
Chloride: 111 mmol/L (ref 98–111)
Creatinine, Ser: 2.01 mg/dL — ABNORMAL HIGH (ref 0.44–1.00)
GFR, Estimated: 26 mL/min — ABNORMAL LOW (ref >60)
Glucose, Bld: 113 mg/dL — ABNORMAL HIGH (ref 70–99)
Potassium: 4 mmol/L (ref 3.5–5.1)
Sodium: 142 mmol/L (ref 135–145)

## 2020-07-09 LAB — CBC WITH DIFFERENTIAL/PLATELET
Abs Immature Granulocytes: 0.02 10*3/uL (ref 0.00–0.07)
Basophils Absolute: 0.1 10*3/uL (ref 0.0–0.1)
Basophils Relative: 1 %
Eosinophils Absolute: 0.1 10*3/uL (ref 0.0–0.5)
Eosinophils Relative: 1 %
HCT: 37.5 % (ref 36.0–46.0)
Hemoglobin: 12.4 g/dL (ref 12.0–15.0)
Immature Granulocytes: 0 %
Lymphocytes Relative: 20 %
Lymphs Abs: 1.4 10*3/uL (ref 0.7–4.0)
MCH: 32.9 pg (ref 26.0–34.0)
MCHC: 33.1 g/dL (ref 30.0–36.0)
MCV: 99.5 fL (ref 80.0–100.0)
Monocytes Absolute: 0.5 10*3/uL (ref 0.1–1.0)
Monocytes Relative: 6 %
Neutro Abs: 5.3 10*3/uL (ref 1.7–7.7)
Neutrophils Relative %: 72 %
Platelets: 224 10*3/uL (ref 150–400)
RBC: 3.77 MIL/uL — ABNORMAL LOW (ref 3.87–5.11)
RDW: 13.7 % (ref 11.5–15.5)
WBC: 7.4 10*3/uL (ref 4.0–10.5)
nRBC: 0 % (ref 0.0–0.2)

## 2020-07-09 NOTE — ED Triage Notes (Signed)
Pt came in with near syncopal episode at [facility]. She was lowered to the ground and did not hit her head. This has happened to her before. Pt states that she dis not get dizzy.

## 2020-07-09 NOTE — ED Provider Notes (Signed)
MSE was initiated and I personally evaluated the patient and placed orders (if any) at  9:00 PM on July 09, 2020.  The patient appears stable so that the remainder of the MSE may be completed by another provider.   Patient placed in Quick Look pathway, seen and evaluated   Chief Complaint: near syncope  HPI:   75 y.o. F BIB EMS for evaluation of near syncope. Patient was at [facility] today when she got the sensation like she might pass out. Reports feeling lightheaded. Bystanders saw this and guided her ot the floor. No syncope, seizure activity. No CP. Patient reports that this sometimes happens to her. EMS noted her to be BP of 90/60. She was given 200 cc of fluids with improvement. Patient denies any CP, abd pain, numbness/ewakness of her arms or legs.   ROS: lightheadedness   Physical Exam:   Gen: No distress  Neuro: Awake and Alert  Skin: Warm    Focused Exam: RRR, Abdomen is soft, non-distended, non-tender. No rigidity, No guarding. No peritoneal signs. Follows commands, Moves all extremities  5/5 strength to BUE and BLE  Sensation intact throughout all major nerve distributions   Initiation of care has begun. The patient has been counseled on the process, plan, and necessity for staying for the completion/evaluation, and the remainder of the medical screening examination.   Portions of this note were generated with Lobbyist. Dictation errors may occur despite best attempts at proofreading.     Volanda Napoleon, PA-C 07/09/20 2106    Lucrezia Starch, MD 07/09/20 2221

## 2020-07-10 ENCOUNTER — Encounter (HOSPITAL_COMMUNITY): Payer: Self-pay | Admitting: Emergency Medicine

## 2020-07-10 ENCOUNTER — Emergency Department (HOSPITAL_COMMUNITY): Payer: Medicare HMO

## 2020-07-10 DIAGNOSIS — R55 Syncope and collapse: Secondary | ICD-10-CM | POA: Diagnosis not present

## 2020-07-10 LAB — URINALYSIS, ROUTINE W REFLEX MICROSCOPIC
Bacteria, UA: NONE SEEN
Bilirubin Urine: NEGATIVE
Glucose, UA: NEGATIVE mg/dL
Ketones, ur: NEGATIVE mg/dL
Nitrite: NEGATIVE
Protein, ur: NEGATIVE mg/dL
Specific Gravity, Urine: 1.01 (ref 1.005–1.030)
pH: 5 (ref 5.0–8.0)

## 2020-07-10 LAB — TROPONIN I (HIGH SENSITIVITY)
Troponin I (High Sensitivity): 3 ng/L (ref <18)
Troponin I (High Sensitivity): 3 ng/L (ref <18)

## 2020-07-10 MED ORDER — FOSFOMYCIN TROMETHAMINE 3 G PO PACK: 3.0000 g | PACK | Freq: Once | ORAL | Status: DC

## 2020-07-10 MED ORDER — SULFAMETHOXAZOLE-TRIMETHOPRIM 800-160 MG PO TABS: 1.0000 | ORAL_TABLET | Freq: Two times a day (BID) | ORAL | 0 refills | Status: AC

## 2020-07-10 MED ORDER — SODIUM CHLORIDE 0.9 % IV BOLUS
500.0000 mL | Freq: Once | INTRAVENOUS | Status: AC
  Administered 2020-07-10: 500 mL via INTRAVENOUS
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
## 2020-07-10 NOTE — ED Notes (Signed)
Pt given water and crackers for PO challenge

## 2020-07-10 NOTE — ED Notes (Signed)
Lab called to run serial troponin, states they will run it now.

## 2020-07-15 DIAGNOSIS — G629 Polyneuropathy, unspecified: Secondary | ICD-10-CM | POA: Diagnosis not present

## 2020-07-15 DIAGNOSIS — N184 Chronic kidney disease, stage 4 (severe): Secondary | ICD-10-CM | POA: Diagnosis not present

## 2020-07-15 DIAGNOSIS — G909 Disorder of the autonomic nervous system, unspecified: Secondary | ICD-10-CM | POA: Diagnosis not present

## 2020-07-15 DIAGNOSIS — I951 Orthostatic hypotension: Secondary | ICD-10-CM | POA: Diagnosis not present
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
## 2020-08-21 ENCOUNTER — Ambulatory Visit (INDEPENDENT_AMBULATORY_CARE_PROVIDER_SITE_OTHER): Payer: Medicare HMO | Admitting: Ophthalmology

## 2020-08-21 ENCOUNTER — Encounter (INDEPENDENT_AMBULATORY_CARE_PROVIDER_SITE_OTHER): Payer: Self-pay | Admitting: Ophthalmology

## 2020-08-21 ENCOUNTER — Other Ambulatory Visit: Payer: Self-pay

## 2020-08-21 DIAGNOSIS — H3581 Retinal edema: Secondary | ICD-10-CM

## 2020-08-21 DIAGNOSIS — Z961 Presence of intraocular lens: Secondary | ICD-10-CM

## 2020-08-21 DIAGNOSIS — H35033 Hypertensive retinopathy, bilateral: Secondary | ICD-10-CM

## 2020-08-21 DIAGNOSIS — H34831 Tributary (branch) retinal vein occlusion, right eye, with macular edema: Secondary | ICD-10-CM

## 2020-08-21 DIAGNOSIS — L511 Stevens-Johnson syndrome: Secondary | ICD-10-CM | POA: Diagnosis not present

## 2020-08-21 DIAGNOSIS — H26493 Other secondary cataract, bilateral: Secondary | ICD-10-CM | POA: Diagnosis not present

## 2020-08-21 DIAGNOSIS — H43822 Vitreomacular adhesion, left eye: Secondary | ICD-10-CM

## 2020-08-21 MED ORDER — AFLIBERCEPT 2MG/0.05ML IZ SOLN FOR KALEIDOSCOPE
2.0000 mg | INTRAVITREAL | Status: AC | PRN
  Administered 2020-08-21: 2 mg via INTRAVITREAL

## 2020-08-28 ENCOUNTER — Ambulatory Visit (INDEPENDENT_AMBULATORY_CARE_PROVIDER_SITE_OTHER): Payer: Medicare HMO | Admitting: Neurology

## 2020-08-28 ENCOUNTER — Other Ambulatory Visit: Payer: Self-pay

## 2020-08-28 ENCOUNTER — Encounter: Payer: Self-pay | Admitting: Neurology

## 2020-08-28 VITALS — BP 112/69 | HR 100 | Ht 66.0 in | Wt 150.0 lb

## 2020-08-28 DIAGNOSIS — G909 Disorder of the autonomic nervous system, unspecified: Secondary | ICD-10-CM

## 2020-08-28 MED ORDER — MIDODRINE HCL 2.5 MG PO TABS: 2.5000 mg | ORAL_TABLET | Freq: Two times a day (BID) | ORAL | 3 refills | Status: AC
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
## 2020-08-28 NOTE — Patient Instructions (Signed)
Start midodrine 2.5mg  daily x 1 week, then increase to 2.5mg  twice daily  Continue florinef 0.1mg  three times daily  Start wearing thigh-high compression stockings  Stay well-hydrated  Return to clinic in 4 months

## 2020-10-19 DIAGNOSIS — N184 Chronic kidney disease, stage 4 (severe): Secondary | ICD-10-CM | POA: Diagnosis not present

## 2020-10-19 DIAGNOSIS — E538 Deficiency of other specified B group vitamins: Secondary | ICD-10-CM | POA: Diagnosis not present

## 2020-10-19 DIAGNOSIS — G909 Disorder of the autonomic nervous system, unspecified: Secondary | ICD-10-CM | POA: Diagnosis not present

## 2020-10-19 DIAGNOSIS — I951 Orthostatic hypotension: Secondary | ICD-10-CM | POA: Diagnosis not present

## 2020-10-19 DIAGNOSIS — E039 Hypothyroidism, unspecified: Secondary | ICD-10-CM | POA: Diagnosis not present

## 2020-10-20 DIAGNOSIS — E538 Deficiency of other specified B group vitamins: Secondary | ICD-10-CM | POA: Diagnosis not present

## 2020-10-22 ENCOUNTER — Emergency Department (HOSPITAL_COMMUNITY): Payer: Medicare HMO

## 2020-10-22 ENCOUNTER — Encounter (HOSPITAL_COMMUNITY): Payer: Self-pay

## 2020-10-22 ENCOUNTER — Emergency Department (HOSPITAL_COMMUNITY)
Admission: EM | Admit: 2020-10-22 | Discharge: 2020-10-23 | Disposition: A | Discharge status: Home / Self Care | Payer: Medicare HMO | Attending: Emergency Medicine | Admitting: Emergency Medicine

## 2020-10-22 ENCOUNTER — Other Ambulatory Visit: Payer: Self-pay

## 2020-10-22 DIAGNOSIS — S199XXA Unspecified injury of neck, initial encounter: Secondary | ICD-10-CM | POA: Diagnosis not present

## 2020-10-22 DIAGNOSIS — Z85828 Personal history of other malignant neoplasm of skin: Secondary | ICD-10-CM | POA: Diagnosis not present

## 2020-10-22 DIAGNOSIS — Y92242 Post office as the place of occurrence of the external cause: Secondary | ICD-10-CM | POA: Insufficient documentation

## 2020-10-22 DIAGNOSIS — S0003XA Contusion of scalp, initial encounter: Secondary | ICD-10-CM | POA: Diagnosis not present

## 2020-10-22 DIAGNOSIS — S0990XA Unspecified injury of head, initial encounter: Secondary | ICD-10-CM | POA: Diagnosis not present

## 2020-10-22 DIAGNOSIS — M47812 Spondylosis without myelopathy or radiculopathy, cervical region: Secondary | ICD-10-CM | POA: Diagnosis not present

## 2020-10-22 DIAGNOSIS — G319 Degenerative disease of nervous system, unspecified: Secondary | ICD-10-CM | POA: Diagnosis not present

## 2020-10-22 DIAGNOSIS — E039 Hypothyroidism, unspecified: Secondary | ICD-10-CM | POA: Diagnosis not present

## 2020-10-22 DIAGNOSIS — W1809XA Striking against other object with subsequent fall, initial encounter: Secondary | ICD-10-CM | POA: Diagnosis not present

## 2020-10-22 DIAGNOSIS — Z79899 Other long term (current) drug therapy: Secondary | ICD-10-CM | POA: Diagnosis not present

## 2020-10-22 NOTE — ED Provider Notes (Signed)
Emergency Medicine Provider Triage Evaluation Note  Natalie Johnson , a 75 y.o. female  was evaluated in triage.  Pt complains of fall, head injury and headache.  Patient has a history of autoimmune neuropathy and cerebellar ataxia and has frequent falls.  [place].  States she was having a significant headache and pain on the right side of her neck.  She took 2 of her migraine tablets with near total resolution of her headache she was having earlier.  She denies any other neurologic abnormalities..  Review of Systems  Positive: Headache Negative: Fever or chill  Physical Exam  BP (!) 143/86 (BP Location: Left Arm)   Pulse 78   Temp 100 F (37.8 C) (Oral)   Resp 16   Ht 5\' 6"  (1.676 m)   Wt 65.3 kg   SpO2 100%   BMI 23.24 kg/m  Gen:   Awake, no distress   Resp:  Normal effort  MSK:   Moves extremities without difficulty  Other:   No obvious head trauma  Medical Decision Making  Medically screening exam initiated at 5:47 PM.  Appropriate orders placed.  Natalie Johnson was informed that the remainder of the evaluation will be completed by another provider, this initial triage assessment does not replace that evaluation, and the importance of remaining in the ED until their evaluation is complete.  Imaging ordered   Natalie Mail, PA-C 10/22/20 Natalie Johnson, Natalie Harwich, DO 10/22/20 2206
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
## 2020-10-22 NOTE — ED Triage Notes (Signed)
Pt reports 2 falls yesterday, hx of multiple falls. Pt states "I just fall it happens all the time". Pt denies loc but has a large knot to the back of her head. Denies taking a blood thinner. Pt a.o, ambulatory

## 2020-10-23 ENCOUNTER — Encounter (INDEPENDENT_AMBULATORY_CARE_PROVIDER_SITE_OTHER): Payer: Self-pay | Admitting: Ophthalmology

## 2020-10-23 ENCOUNTER — Ambulatory Visit (INDEPENDENT_AMBULATORY_CARE_PROVIDER_SITE_OTHER): Payer: Medicare HMO | Admitting: Ophthalmology

## 2020-10-23 DIAGNOSIS — H26493 Other secondary cataract, bilateral: Secondary | ICD-10-CM

## 2020-10-23 DIAGNOSIS — H34831 Tributary (branch) retinal vein occlusion, right eye, with macular edema: Secondary | ICD-10-CM | POA: Diagnosis not present

## 2020-10-23 DIAGNOSIS — H3581 Retinal edema: Secondary | ICD-10-CM

## 2020-10-23 DIAGNOSIS — Z961 Presence of intraocular lens: Secondary | ICD-10-CM | POA: Diagnosis not present

## 2020-10-23 DIAGNOSIS — H43822 Vitreomacular adhesion, left eye: Secondary | ICD-10-CM | POA: Diagnosis not present

## 2020-10-23 DIAGNOSIS — L511 Stevens-Johnson syndrome: Secondary | ICD-10-CM

## 2020-10-23 DIAGNOSIS — H35033 Hypertensive retinopathy, bilateral: Secondary | ICD-10-CM

## 2020-10-23 MED ORDER — AFLIBERCEPT 2MG/0.05ML IZ SOLN FOR KALEIDOSCOPE
2.0000 mg | INTRAVITREAL | Status: AC | PRN
  Administered 2020-10-23: 2 mg via INTRAVITREAL

## 2020-10-23 NOTE — ED Notes (Signed)
Pt ambulatory at d/c with cane. VSS. GCS 15.
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
## 2020-11-02 DIAGNOSIS — H34831 Tributary (branch) retinal vein occlusion, right eye, with macular edema: Secondary | ICD-10-CM | POA: Diagnosis not present

## 2020-11-02 DIAGNOSIS — M7541 Impingement syndrome of right shoulder: Secondary | ICD-10-CM | POA: Diagnosis not present

## 2020-11-02 DIAGNOSIS — H04123 Dry eye syndrome of bilateral lacrimal glands: Secondary | ICD-10-CM | POA: Diagnosis not present

## 2020-11-02 DIAGNOSIS — M25511 Pain in right shoulder: Secondary | ICD-10-CM | POA: Diagnosis not present

## 2020-11-02 DIAGNOSIS — S40011A Contusion of right shoulder, initial encounter: Secondary | ICD-10-CM | POA: Diagnosis not present
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
## 2020-12-28 ENCOUNTER — Ambulatory Visit: Payer: Medicare HMO | Admitting: Neurology

## 2020-12-28 ENCOUNTER — Encounter: Payer: Self-pay | Admitting: Neurology

## 2020-12-28 ENCOUNTER — Other Ambulatory Visit: Payer: Self-pay

## 2020-12-28 VITALS — BP 111/67 | HR 107 | Ht 66.0 in | Wt 150.0 lb

## 2020-12-28 DIAGNOSIS — G909 Disorder of the autonomic nervous system, unspecified: Secondary | ICD-10-CM

## 2020-12-28 NOTE — Progress Notes (Addendum)
Follow-up Visit   Date: 12/28/20   Natalie Johnson MRN: [id number] DOB: [birth year]   Interim History: Natalie Johnson is a 75 y.o. right-handed Caucasian female with fibromyalgia, hyperlipidemia, retinopathy, history of SJS (vancomycin), migraines, hypothyroidism, autonomic neuropathy, and anxiety returning to the clinic for follow-up of autonomic neuropathy.  The patient was accompanied to the clinic by self.  History of present illness: She was diagnosed in 2017 with autoimmune autonomic neuropathy when living in [place] after presenting with multiple falls and positional orthostasis.  She was initially treated with methylprednisolone, followed prednisone taper. She developed severe steroid side effects and no improvement, so it was stopped. She has fractured both ankles because falling. Sometimes, she can tell when she is going to fall and tries to hold on to something. She continues to have daily spells of orthostasis. She saw Dr, Krista Blue in 2020 who notes were reviewed. NCS/EMG showed mild axonal neuropathy. She takes florinef 0.1mg  three times daily. She did not tolerate mestinon (cramping) or midodrine 5mg  TID (nausea).  She does wear knee high stockings. She endorses dry mouth, urinary incontinence, and dry skin.   She moved to [facility] in 2018 after her husband passed away to be closer to her son.    UPDATE 12/28/2020:  She was started on midodrine 2.5mg , but developed GI upset so sopped the medication.  She remains on florinef 0.1mg  three times daily and overall feels that her symptoms are very stable.   She has fallen several times.  She continues to use a crutches.  She did not get thigh high stockings or a walker.  Patient is loosing vision in her right eye, she has history of retinal hemorrhage.  Her mother who is 31 is having cognitive issues and she is hoping to visit her again in [place].    Medications:  Current Outpatient Medications on File Prior to Visit  Medication  Sig Dispense Refill   amitriptyline (ELAVIL) 100 MG tablet Take 100 mg by mouth at bedtime.      Bismuth Subsalicylate 268 TM/19QQ SUSP Take by mouth 4 (four) times daily. 1 teaspoon     clonazePAM (KLONOPIN) 1 MG tablet Take 1 mg by mouth at bedtime as needed for anxiety.      clonazePAM (KLONOPIN) 2 MG tablet TAKE 1 2 TO 1 (ONE HALF TO ONE) TABLET BY MOUTH AT BEDTIME AS NEEDED FOR SLEEP     DULoxetine (CYMBALTA) 30 MG capsule Take 30 mg by mouth 2 (two) times daily.     fludrocortisone (FLORINEF) 0.1 MG tablet Take 0.1-0.2 mg by mouth 3 (three) times daily.      levothyroxine (SYNTHROID, LEVOTHROID) 100 MCG tablet Take 100 mcg by mouth daily before breakfast.      lovastatin (MEVACOR) 40 MG tablet Take 40 mg by mouth at bedtime.      potassium chloride (K-DUR) 10 MEQ tablet Take 20 mEq by mouth 2 (two) times daily. Takes 2 in am and 1 at bedtime.     pyridostigmine (MESTINON) 60 MG tablet Take 1 tablet (60 mg total) by mouth 3 (three) times daily. 270 tablet 3   rizatriptan (MAXALT) 10 MG tablet Take 10 mg by mouth as needed for migraine.      midodrine (PROAMATINE) 2.5 MG tablet Take 1 tablet (2.5 mg total) by mouth 2 (two) times daily with a meal. (Patient not taking: Reported on 12/28/2020) 180 tablet 3   omeprazole (PRILOSEC) 40 MG capsule Take 40 mg by mouth daily.  Current Facility-Administered Medications on File Prior to Visit  Medication Dose Route Frequency Provider Last Rate Last Admin   aflibercept (EYLEA) SOLN 2 mg  2 mg Intravitreal  [name], MD   2 mg at 07/04/17 1450   aflibercept (EYLEA) SOLN 2 mg  2 mg Intravitreal  [name], MD   2 mg at 08/08/17 1524   aflibercept (EYLEA) SOLN 2 mg  2 mg Intravitreal  [name], MD   2 mg at 09/12/17 1303   aflibercept (EYLEA) SOLN 2 mg  2 mg Intravitreal  [name], MD   2 mg at 10/09/17 1435   aflibercept (EYLEA) SOLN 2 mg  2 mg Intravitreal  [name], MD   2 mg at 11/06/17 1550   aflibercept (EYLEA) SOLN 2 mg   2 mg Intravitreal  [name], MD   2 mg at 12/18/17 1412   aflibercept (EYLEA) SOLN 2 mg  2 mg Intravitreal  [name], MD   2 mg at 02/12/18 2237   Bevacizumab (AVASTIN) SOLN 1.25 mg  1.25 mg Intravitreal  [name], MD   1.25 mg at 01/31/17 1326   Bevacizumab (AVASTIN) SOLN 1.25 mg  1.25 mg Intravitreal  [name], MD   1.25 mg at 03/01/17 1412   Bevacizumab (AVASTIN) SOLN 1.25 mg  1.25 mg Intravitreal  [name], MD   1.25 mg at 04/04/17 1558   Bevacizumab (AVASTIN) SOLN 1.25 mg  1.25 mg Intravitreal  [name], MD   1.25 mg at 05/03/17 1717   Bevacizumab (AVASTIN) SOLN 1.25 mg  1.25 mg Intravitreal  [name], MD   1.25 mg at 06/05/17 1514   Bevacizumab (AVASTIN) SOLN 1.25 mg  1.25 mg Intravitreal  [name], MD   1.25 mg at 04/09/18 1640    Allergies:  Allergies  Allergen Reactions   Augmentin [Amoxicillin-Pot Clavulanate] Anaphylaxis   Trihexyphenidyl Hcl Anaphylaxis   Vancomycin Anaphylaxis   Amoxicillin Hives   Penicillins Hives    DID THE REACTION INVOLVE: Swelling of the face/tongue/throat, SOB, or low BP? Unknown Sudden or severe rash/hives, skin peeling, or the inside of the mouth or nose? Unknown Did it require medical treatment? Unknown When did it last happen?    2010  If all above answers are "NO", may proceed with cephalosporin use.   Piperacillin Hives   Sodium Acetylsalicylate [Aspirin] Hives   Soma [Carisoprodol] Hives   Linezolid Rash    Vital Signs:  BP 111/67   Pulse (!) 107   Ht 5\' 6"  (1.676 m)   Wt 150 lb (68 kg)   SpO2 99%   BMI 24.21 kg/m   Neurological Exam: MENTAL STATUS including orientation to time, place, person, recent and remote memory, attention span and concentration, language, and fund of knowledge is normal.  Speech is not dysarthric.  CRANIAL NERVES:  Reduced vision in the right eye. Pupils equal round and reactive to light.  Normal conjugate, extra-ocular eye movements in all directions of gaze.  No  ptosis.  Face is symmetric.   MOTOR:  Motor strength is 5/5 in all extremities.  No atrophy, fasciculations or abnormal movements.  No pronator drift.  Tone is normal.    MSRs:  Reflexes are 2+/4 throughout, except 1+/4 at the ankles.  SENSORY:  Vibration reduced at the ankles.  COORDINATION/GAIT:  Normal finger-to- nose-finger.  Intact rapid alternating movements bilaterally.  Gait narrow based and stable.   Data: NCS/EMG of the legs 01/09/2019: This is a mild abnormal study.  There  is electrodiagnostic evidence of mild axonal sensorimotor polyneuropathy.  There is no evidence of left lumbar radiculopathy.  IMPRESSION/PLAN: Autonomic neuropathy manifesting with orthostatic hypotension and distal paresthesias - No response to IVMP or corticosteroids - Did not tolerate mestinon due to cramping - Continue florinef 0.1mg  TID - Recommend wearing thigh high compression stockings and abdominal binder - Recommend she start to use a rollator - She will call when ready to start PT  Return to clinic in 1 year  Total time spent reviewing records, interview, history/exam, documentation, and coordination of care on day of encounter:  20 min    Thank you for allowing me to participate in patient's care.  If I can answer any additional questions, I would be pleased to do so.    Sincerely,    Siraj Dermody K. Posey Pronto, DO

## 2020-12-28 NOTE — Patient Instructions (Signed)
Recommend wearing thigh high stocking  Continue florinef 0.1mg  three times daily  Return to clinic in 1 year

## 2020-12-29 ENCOUNTER — Ambulatory Visit (INDEPENDENT_AMBULATORY_CARE_PROVIDER_SITE_OTHER): Payer: Medicare HMO | Admitting: Ophthalmology

## 2020-12-29 ENCOUNTER — Encounter (INDEPENDENT_AMBULATORY_CARE_PROVIDER_SITE_OTHER): Payer: Self-pay | Admitting: Ophthalmology

## 2020-12-29 DIAGNOSIS — H34831 Tributary (branch) retinal vein occlusion, right eye, with macular edema: Secondary | ICD-10-CM | POA: Diagnosis not present

## 2020-12-29 DIAGNOSIS — H26493 Other secondary cataract, bilateral: Secondary | ICD-10-CM | POA: Diagnosis not present

## 2020-12-29 DIAGNOSIS — H35033 Hypertensive retinopathy, bilateral: Secondary | ICD-10-CM

## 2020-12-29 DIAGNOSIS — L511 Stevens-Johnson syndrome: Secondary | ICD-10-CM | POA: Diagnosis not present

## 2020-12-29 DIAGNOSIS — H3582 Retinal ischemia: Secondary | ICD-10-CM | POA: Diagnosis not present

## 2020-12-29 DIAGNOSIS — Z961 Presence of intraocular lens: Secondary | ICD-10-CM | POA: Diagnosis not present

## 2020-12-29 DIAGNOSIS — I10 Essential (primary) hypertension: Secondary | ICD-10-CM | POA: Diagnosis not present

## 2020-12-29 DIAGNOSIS — H43822 Vitreomacular adhesion, left eye: Secondary | ICD-10-CM | POA: Diagnosis not present

## 2020-12-29 DIAGNOSIS — H3581 Retinal edema: Secondary | ICD-10-CM

## 2020-12-29 MED ORDER — AFLIBERCEPT 2MG/0.05ML IZ SOLN FOR KALEIDOSCOPE
2.0000 mg | INTRAVITREAL | Status: AC | PRN
  Administered 2020-12-29: 2 mg via INTRAVITREAL

## 2021-01-18 ENCOUNTER — Encounter (INDEPENDENT_AMBULATORY_CARE_PROVIDER_SITE_OTHER): Payer: Self-pay

## 2021-01-18 ENCOUNTER — Encounter (INDEPENDENT_AMBULATORY_CARE_PROVIDER_SITE_OTHER): Payer: Medicare HMO | Admitting: Ophthalmology
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
## 2021-02-01 ENCOUNTER — Other Ambulatory Visit: Payer: Self-pay

## 2021-02-01 ENCOUNTER — Ambulatory Visit (INDEPENDENT_AMBULATORY_CARE_PROVIDER_SITE_OTHER): Payer: Medicare HMO | Admitting: Ophthalmology

## 2021-02-01 ENCOUNTER — Encounter (INDEPENDENT_AMBULATORY_CARE_PROVIDER_SITE_OTHER): Payer: Self-pay | Admitting: Ophthalmology

## 2021-02-01 DIAGNOSIS — Z961 Presence of intraocular lens: Secondary | ICD-10-CM | POA: Diagnosis not present

## 2021-02-01 DIAGNOSIS — H3582 Retinal ischemia: Secondary | ICD-10-CM | POA: Diagnosis not present

## 2021-02-01 DIAGNOSIS — L511 Stevens-Johnson syndrome: Secondary | ICD-10-CM

## 2021-02-01 DIAGNOSIS — H26493 Other secondary cataract, bilateral: Secondary | ICD-10-CM | POA: Diagnosis not present

## 2021-02-01 DIAGNOSIS — H34831 Tributary (branch) retinal vein occlusion, right eye, with macular edema: Secondary | ICD-10-CM

## 2021-02-01 DIAGNOSIS — H3581 Retinal edema: Secondary | ICD-10-CM

## 2021-02-01 DIAGNOSIS — I10 Essential (primary) hypertension: Secondary | ICD-10-CM | POA: Diagnosis not present

## 2021-02-01 DIAGNOSIS — H35033 Hypertensive retinopathy, bilateral: Secondary | ICD-10-CM

## 2021-02-01 DIAGNOSIS — H43822 Vitreomacular adhesion, left eye: Secondary | ICD-10-CM

## 2021-02-01 MED ORDER — PREDNISOLONE ACETATE 1 % OP SUSP: 1.0000 [drp] | Freq: Four times a day (QID) | OPHTHALMIC | 0 refills | Status: AC
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
## 2021-02-09 ENCOUNTER — Other Ambulatory Visit: Payer: Self-pay

## 2021-02-09 ENCOUNTER — Encounter (INDEPENDENT_AMBULATORY_CARE_PROVIDER_SITE_OTHER): Payer: Self-pay | Admitting: Ophthalmology

## 2021-02-09 ENCOUNTER — Ambulatory Visit (INDEPENDENT_AMBULATORY_CARE_PROVIDER_SITE_OTHER): Payer: Medicare HMO | Admitting: Ophthalmology

## 2021-02-09 DIAGNOSIS — H43822 Vitreomacular adhesion, left eye: Secondary | ICD-10-CM

## 2021-02-09 DIAGNOSIS — H26493 Other secondary cataract, bilateral: Secondary | ICD-10-CM

## 2021-02-09 DIAGNOSIS — H35033 Hypertensive retinopathy, bilateral: Secondary | ICD-10-CM | POA: Diagnosis not present

## 2021-02-09 DIAGNOSIS — L511 Stevens-Johnson syndrome: Secondary | ICD-10-CM

## 2021-02-09 DIAGNOSIS — H3582 Retinal ischemia: Secondary | ICD-10-CM

## 2021-02-09 DIAGNOSIS — Z961 Presence of intraocular lens: Secondary | ICD-10-CM | POA: Diagnosis not present

## 2021-02-09 DIAGNOSIS — I10 Essential (primary) hypertension: Secondary | ICD-10-CM | POA: Diagnosis not present

## 2021-02-09 DIAGNOSIS — H34831 Tributary (branch) retinal vein occlusion, right eye, with macular edema: Secondary | ICD-10-CM | POA: Diagnosis not present

## 2021-02-09 DIAGNOSIS — H3581 Retinal edema: Secondary | ICD-10-CM

## 2021-02-09 MED ORDER — AFLIBERCEPT 2MG/0.05ML IZ SOLN FOR KALEIDOSCOPE
2.0000 mg | INTRAVITREAL | Status: AC | PRN
  Administered 2021-02-09: 2 mg via INTRAVITREAL
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
## 2021-03-23 ENCOUNTER — Other Ambulatory Visit: Payer: Self-pay

## 2021-03-23 ENCOUNTER — Ambulatory Visit (INDEPENDENT_AMBULATORY_CARE_PROVIDER_SITE_OTHER): Payer: Medicare HMO | Admitting: Ophthalmology

## 2021-03-23 ENCOUNTER — Encounter (INDEPENDENT_AMBULATORY_CARE_PROVIDER_SITE_OTHER): Payer: Self-pay | Admitting: Ophthalmology

## 2021-03-23 DIAGNOSIS — I10 Essential (primary) hypertension: Secondary | ICD-10-CM | POA: Diagnosis not present

## 2021-03-23 DIAGNOSIS — L511 Stevens-Johnson syndrome: Secondary | ICD-10-CM | POA: Diagnosis not present

## 2021-03-23 DIAGNOSIS — H35033 Hypertensive retinopathy, bilateral: Secondary | ICD-10-CM | POA: Diagnosis not present

## 2021-03-23 DIAGNOSIS — H34831 Tributary (branch) retinal vein occlusion, right eye, with macular edema: Secondary | ICD-10-CM | POA: Diagnosis not present

## 2021-03-23 DIAGNOSIS — Z961 Presence of intraocular lens: Secondary | ICD-10-CM | POA: Diagnosis not present

## 2021-03-23 DIAGNOSIS — H26493 Other secondary cataract, bilateral: Secondary | ICD-10-CM | POA: Diagnosis not present

## 2021-03-23 DIAGNOSIS — H43822 Vitreomacular adhesion, left eye: Secondary | ICD-10-CM

## 2021-03-23 DIAGNOSIS — H3581 Retinal edema: Secondary | ICD-10-CM

## 2021-03-23 DIAGNOSIS — H3582 Retinal ischemia: Secondary | ICD-10-CM

## 2021-03-23 MED ORDER — AFLIBERCEPT 2MG/0.05ML IZ SOLN FOR KALEIDOSCOPE
2.0000 mg | INTRAVITREAL | Status: AC | PRN
  Administered 2021-03-23: 2 mg via INTRAVITREAL

## 2021-03-31 DIAGNOSIS — M25572 Pain in left ankle and joints of left foot: Secondary | ICD-10-CM | POA: Diagnosis not present

## 2021-03-31 DIAGNOSIS — M79672 Pain in left foot: Secondary | ICD-10-CM | POA: Diagnosis not present

## 2021-04-01 ENCOUNTER — Other Ambulatory Visit: Payer: Self-pay | Admitting: Medical

## 2021-04-01 DIAGNOSIS — M79672 Pain in left foot: Secondary | ICD-10-CM

## 2021-04-02 ENCOUNTER — Ambulatory Visit
Admission: RE | Admit: 2021-04-02 | Discharge: 2021-04-02 | Disposition: A | Discharge status: Home / Self Care | Payer: Medicare HMO | Source: Ambulatory Visit | Attending: Medical | Admitting: Medical

## 2021-04-02 DIAGNOSIS — M79672 Pain in left foot: Secondary | ICD-10-CM

## 2021-04-02 DIAGNOSIS — M7989 Other specified soft tissue disorders: Secondary | ICD-10-CM | POA: Diagnosis not present

## 2021-04-02 DIAGNOSIS — S92332A Displaced fracture of third metatarsal bone, left foot, initial encounter for closed fracture: Secondary | ICD-10-CM | POA: Diagnosis not present

## 2021-04-07 DIAGNOSIS — S92335A Nondisplaced fracture of third metatarsal bone, left foot, initial encounter for closed fracture: Secondary | ICD-10-CM | POA: Diagnosis not present

## 2021-04-20 DIAGNOSIS — Z0100 Encounter for examination of eyes and vision without abnormal findings: Secondary | ICD-10-CM | POA: Diagnosis not present

## 2021-04-20 DIAGNOSIS — H2513 Age-related nuclear cataract, bilateral: Secondary | ICD-10-CM | POA: Diagnosis not present

## 2021-05-07 ENCOUNTER — Encounter (INDEPENDENT_AMBULATORY_CARE_PROVIDER_SITE_OTHER): Payer: Self-pay | Admitting: Ophthalmology

## 2021-05-07 ENCOUNTER — Other Ambulatory Visit: Payer: Self-pay

## 2021-05-07 ENCOUNTER — Ambulatory Visit (INDEPENDENT_AMBULATORY_CARE_PROVIDER_SITE_OTHER): Payer: Medicare HMO | Admitting: Ophthalmology

## 2021-05-07 DIAGNOSIS — I10 Essential (primary) hypertension: Secondary | ICD-10-CM

## 2021-05-07 DIAGNOSIS — E785 Hyperlipidemia, unspecified: Secondary | ICD-10-CM | POA: Diagnosis not present

## 2021-05-07 DIAGNOSIS — H26493 Other secondary cataract, bilateral: Secondary | ICD-10-CM | POA: Diagnosis not present

## 2021-05-07 DIAGNOSIS — H34831 Tributary (branch) retinal vein occlusion, right eye, with macular edema: Secondary | ICD-10-CM

## 2021-05-07 DIAGNOSIS — L511 Stevens-Johnson syndrome: Secondary | ICD-10-CM | POA: Diagnosis not present

## 2021-05-07 DIAGNOSIS — H43822 Vitreomacular adhesion, left eye: Secondary | ICD-10-CM

## 2021-05-07 DIAGNOSIS — Z961 Presence of intraocular lens: Secondary | ICD-10-CM

## 2021-05-07 DIAGNOSIS — H35033 Hypertensive retinopathy, bilateral: Secondary | ICD-10-CM

## 2021-05-07 DIAGNOSIS — E039 Hypothyroidism, unspecified: Secondary | ICD-10-CM | POA: Diagnosis not present
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
## 2021-05-10 DIAGNOSIS — S92345D Nondisplaced fracture of fourth metatarsal bone, left foot, subsequent encounter for fracture with routine healing: Secondary | ICD-10-CM | POA: Diagnosis not present

## 2021-05-10 DIAGNOSIS — M79672 Pain in left foot: Secondary | ICD-10-CM | POA: Diagnosis not present

## 2021-05-10 DIAGNOSIS — E538 Deficiency of other specified B group vitamins: Secondary | ICD-10-CM | POA: Diagnosis not present

## 2021-05-10 DIAGNOSIS — S92335D Nondisplaced fracture of third metatarsal bone, left foot, subsequent encounter for fracture with routine healing: Secondary | ICD-10-CM | POA: Diagnosis not present
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
## 2021-05-18 ENCOUNTER — Encounter (INDEPENDENT_AMBULATORY_CARE_PROVIDER_SITE_OTHER): Payer: Self-pay | Admitting: Ophthalmology

## 2021-05-18 ENCOUNTER — Other Ambulatory Visit: Payer: Self-pay

## 2021-05-18 ENCOUNTER — Ambulatory Visit (INDEPENDENT_AMBULATORY_CARE_PROVIDER_SITE_OTHER): Payer: Medicare HMO | Admitting: Ophthalmology

## 2021-05-18 DIAGNOSIS — H26493 Other secondary cataract, bilateral: Secondary | ICD-10-CM | POA: Diagnosis not present

## 2021-05-18 DIAGNOSIS — I10 Essential (primary) hypertension: Secondary | ICD-10-CM

## 2021-05-18 DIAGNOSIS — H34831 Tributary (branch) retinal vein occlusion, right eye, with macular edema: Secondary | ICD-10-CM | POA: Diagnosis not present

## 2021-05-18 DIAGNOSIS — H43822 Vitreomacular adhesion, left eye: Secondary | ICD-10-CM | POA: Diagnosis not present

## 2021-05-18 DIAGNOSIS — H35033 Hypertensive retinopathy, bilateral: Secondary | ICD-10-CM

## 2021-05-18 DIAGNOSIS — Z961 Presence of intraocular lens: Secondary | ICD-10-CM | POA: Diagnosis not present

## 2021-05-18 DIAGNOSIS — L511 Stevens-Johnson syndrome: Secondary | ICD-10-CM

## 2021-05-18 MED ORDER — AFLIBERCEPT 2MG/0.05ML IZ SOLN FOR KALEIDOSCOPE
2.0000 mg | INTRAVITREAL | Status: AC | PRN
  Administered 2021-05-18: 2 mg via INTRAVITREAL

## 2021-05-19 ENCOUNTER — Encounter (INDEPENDENT_AMBULATORY_CARE_PROVIDER_SITE_OTHER): Payer: Self-pay | Admitting: Ophthalmology

## 2021-06-04 DIAGNOSIS — G909 Disorder of the autonomic nervous system, unspecified: Secondary | ICD-10-CM | POA: Diagnosis not present

## 2021-06-04 DIAGNOSIS — G629 Polyneuropathy, unspecified: Secondary | ICD-10-CM | POA: Diagnosis not present

## 2021-06-04 DIAGNOSIS — E039 Hypothyroidism, unspecified: Secondary | ICD-10-CM | POA: Diagnosis not present

## 2021-06-04 DIAGNOSIS — I951 Orthostatic hypotension: Secondary | ICD-10-CM | POA: Diagnosis not present

## 2021-06-04 DIAGNOSIS — R2681 Unsteadiness on feet: Secondary | ICD-10-CM | POA: Diagnosis not present

## 2021-06-04 DIAGNOSIS — Z1331 Encounter for screening for depression: Secondary | ICD-10-CM | POA: Diagnosis not present

## 2021-06-04 DIAGNOSIS — E538 Deficiency of other specified B group vitamins: Secondary | ICD-10-CM | POA: Diagnosis not present

## 2021-06-04 DIAGNOSIS — E785 Hyperlipidemia, unspecified: Secondary | ICD-10-CM | POA: Diagnosis not present

## 2021-06-04 DIAGNOSIS — Z0000 Encounter for general adult medical examination without abnormal findings: Secondary | ICD-10-CM | POA: Diagnosis not present

## 2021-06-04 DIAGNOSIS — R82998 Other abnormal findings in urine: Secondary | ICD-10-CM | POA: Diagnosis not present

## 2021-06-07 DIAGNOSIS — S92335D Nondisplaced fracture of third metatarsal bone, left foot, subsequent encounter for fracture with routine healing: Secondary | ICD-10-CM | POA: Diagnosis not present

## 2021-06-07 DIAGNOSIS — M79672 Pain in left foot: Secondary | ICD-10-CM | POA: Diagnosis not present

## 2021-06-07 DIAGNOSIS — S92345D Nondisplaced fracture of fourth metatarsal bone, left foot, subsequent encounter for fracture with routine healing: Secondary | ICD-10-CM | POA: Diagnosis not present

## 2021-06-09 ENCOUNTER — Other Ambulatory Visit: Payer: Self-pay | Admitting: Internal Medicine

## 2021-06-09 DIAGNOSIS — Z1231 Encounter for screening mammogram for malignant neoplasm of breast: Secondary | ICD-10-CM
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
## 2021-07-06 ENCOUNTER — Encounter (INDEPENDENT_AMBULATORY_CARE_PROVIDER_SITE_OTHER): Payer: Self-pay | Admitting: Ophthalmology

## 2021-07-06 ENCOUNTER — Ambulatory Visit (INDEPENDENT_AMBULATORY_CARE_PROVIDER_SITE_OTHER): Payer: Medicare HMO | Admitting: Ophthalmology

## 2021-07-06 DIAGNOSIS — I10 Essential (primary) hypertension: Secondary | ICD-10-CM

## 2021-07-06 DIAGNOSIS — L511 Stevens-Johnson syndrome: Secondary | ICD-10-CM

## 2021-07-06 DIAGNOSIS — H35033 Hypertensive retinopathy, bilateral: Secondary | ICD-10-CM | POA: Diagnosis not present

## 2021-07-06 DIAGNOSIS — H34831 Tributary (branch) retinal vein occlusion, right eye, with macular edema: Secondary | ICD-10-CM

## 2021-07-06 DIAGNOSIS — H43822 Vitreomacular adhesion, left eye: Secondary | ICD-10-CM

## 2021-07-06 DIAGNOSIS — Z961 Presence of intraocular lens: Secondary | ICD-10-CM | POA: Diagnosis not present

## 2021-07-06 DIAGNOSIS — H26493 Other secondary cataract, bilateral: Secondary | ICD-10-CM

## 2021-07-06 MED ORDER — AFLIBERCEPT 2MG/0.05ML IZ SOLN FOR KALEIDOSCOPE
2.0000 mg | INTRAVITREAL | Status: AC | PRN
  Administered 2021-07-06: 2 mg via INTRAVITREAL

## 2021-07-22 ENCOUNTER — Ambulatory Visit: Payer: Medicare HMO

## 2021-07-27 ENCOUNTER — Ambulatory Visit: Payer: Medicare HMO

## 2021-08-03 ENCOUNTER — Ambulatory Visit
Admission: RE | Admit: 2021-08-03 | Discharge: 2021-08-03 | Disposition: A | Discharge status: Home / Self Care | Payer: Medicare HMO | Source: Ambulatory Visit | Attending: Internal Medicine | Admitting: Internal Medicine

## 2021-08-03 DIAGNOSIS — Z1231 Encounter for screening mammogram for malignant neoplasm of breast: Secondary | ICD-10-CM | POA: Diagnosis not present

## 2021-08-11 DIAGNOSIS — M25572 Pain in left ankle and joints of left foot: Secondary | ICD-10-CM | POA: Diagnosis not present
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
## 2021-08-19 ENCOUNTER — Other Ambulatory Visit: Payer: Self-pay

## 2021-08-19 ENCOUNTER — Emergency Department (HOSPITAL_COMMUNITY)
Admission: EM | Admit: 2021-08-19 | Discharge: 2021-08-19 | Disposition: A | Discharge status: Home / Self Care | Payer: Medicare HMO | Attending: Emergency Medicine | Admitting: Emergency Medicine

## 2021-08-19 ENCOUNTER — Emergency Department (HOSPITAL_COMMUNITY): Payer: Medicare HMO

## 2021-08-19 ENCOUNTER — Encounter (HOSPITAL_COMMUNITY): Payer: Self-pay

## 2021-08-19 DIAGNOSIS — Y92002 Bathroom of unspecified non-institutional (private) residence single-family (private) house as the place of occurrence of the external cause: Secondary | ICD-10-CM | POA: Insufficient documentation

## 2021-08-19 DIAGNOSIS — R519 Headache, unspecified: Secondary | ICD-10-CM | POA: Diagnosis not present

## 2021-08-19 DIAGNOSIS — I959 Hypotension, unspecified: Secondary | ICD-10-CM | POA: Insufficient documentation

## 2021-08-19 DIAGNOSIS — S0081XA Abrasion of other part of head, initial encounter: Secondary | ICD-10-CM | POA: Insufficient documentation

## 2021-08-19 DIAGNOSIS — R0781 Pleurodynia: Secondary | ICD-10-CM | POA: Diagnosis not present

## 2021-08-19 DIAGNOSIS — W19XXXA Unspecified fall, initial encounter: Secondary | ICD-10-CM | POA: Insufficient documentation

## 2021-08-19 DIAGNOSIS — R296 Repeated falls: Secondary | ICD-10-CM | POA: Diagnosis not present

## 2021-08-19 DIAGNOSIS — S0990XA Unspecified injury of head, initial encounter: Secondary | ICD-10-CM | POA: Diagnosis not present

## 2021-08-19 DIAGNOSIS — I739 Peripheral vascular disease, unspecified: Secondary | ICD-10-CM | POA: Diagnosis not present

## 2021-08-19 NOTE — ED Notes (Signed)
Discharge instructions reviewed with patient. Patient verbalized understanding of instructions. Follow-up care and medications were reviewed. Patient ambulatory with steady gait. VSS upon discharge.  ?

## 2021-08-19 NOTE — ED Notes (Signed)
Pt presents A&O x4 and ambulatory. Facial swelling to the L ete. No visual changes. Lungs clear bilaterally to auscultation. Pt denies CP/SOB. No visual signs of bruising to L flank.

## 2021-08-19 NOTE — Discharge Instructions (Addendum)
Your CT scans and x-rays do not show any new fracture.  Your swelling and pain likely due to bruising and contusions.  Take Tylenol as needed for pain.  Follow-up with your doctor this week.  Return to the ER if you have chest pain difficulty breathing worsening symptoms or any additional concerns.

## 2021-08-19 NOTE — ED Triage Notes (Signed)
Pt BIBA from home for a fall in the bathroom. No LOC. Pt c/o L sdie rib pain and L side facial pain. Pt with hx of orthostatic hypotension. Pt reports being out of fludrocortisone for aprox 2 wks. Started back on it yesterday.

## 2021-08-19 NOTE — ED Provider Notes (Signed)
[facility] EMERGENCY DEPARTMENT Provider Note   CSN: [id number] Arrival date & time: 08/19/21  1312     History  Chief Complaint  Patient presents with   Lytle Michaels    Natalie Johnson is a 76 y.o. female.  Patient presents to ER chief complaint of fall, complaining of left facial pain and left rib pains from the fall.  She states she was in the bathroom when she lost her balance and fell denies loss of consciousness.  She states she has a history of frequent falls due to episodic low blood pressure.  Otherwise denies any fevers or cough no vomiting or diarrhea complaining of facial pain but no headache.  Denies neck pain or back pain or other extremity pain.      Home Medications Prior to Admission medications   Medication Sig Start Date End Date Taking? Authorizing Provider  amitriptyline (ELAVIL) 100 MG tablet Take 100 mg by mouth at bedtime.  12/24/16   [provider]  Bismuth Subsalicylate 956 OZ/30QM SUSP Take by mouth 4 (four) times daily. 1 teaspoon    [provider]  clonazePAM (KLONOPIN) 1 MG tablet Take 1 mg by mouth at bedtime as needed for anxiety.     [provider]  clonazePAM (KLONOPIN) 2 MG tablet TAKE 1 2 TO 1 (ONE HALF TO ONE) TABLET BY MOUTH AT BEDTIME AS NEEDED FOR SLEEP Patient not taking: Reported on 07/06/2021 11/05/18   [provider]  DULoxetine (CYMBALTA) 30 MG capsule Take 30 mg by mouth 2 (two) times daily. 05/24/18   [provider]  fludrocortisone (FLORINEF) 0.1 MG tablet Take 0.1-0.2 mg by mouth 3 (three) times daily.     [provider]  levothyroxine (SYNTHROID) 125 MCG tablet  12/04/20   [provider]  levothyroxine (SYNTHROID, LEVOTHROID) 100 MCG tablet Take 100 mcg by mouth daily before breakfast.  12/24/16   [provider]  lovastatin (MEVACOR) 40 MG tablet Take 40 mg by mouth at bedtime.  12/24/16   [provider]  midodrine (PROAMATINE) 2.5 MG tablet  Take 1 tablet (2.5 mg total) by mouth 2 (two) times daily with a meal. 08/28/20   Patel, Donika K, DO  omeprazole (PRILOSEC) 40 MG capsule Take 40 mg by mouth daily.  12/24/16   [provider]  PFIZER COVID-19 VAC BIVALENT injection  01/05/21   [provider]  potassium chloride (K-DUR) 10 MEQ tablet Take 20 mEq by mouth 2 (two) times daily. Takes 2 in am and 1 at bedtime. 01/19/17   [provider]  pyridostigmine (MESTINON) 60 MG tablet Take 1 tablet (60 mg total) by mouth 3 (three) times daily. 03/05/19   Marcial Pacas, MD  rizatriptan (MAXALT) 10 MG tablet Take 10 mg by mouth as needed for migraine.  12/24/16   [provider]      Allergies    Augmentin [amoxicillin-pot clavulanate], Trihexyphenidyl hcl, Vancomycin, Amoxicillin, Penicillins, Piperacillin, Sodium acetylsalicylate [aspirin], Soma [carisoprodol], and Linezolid    Review of Systems   Review of Systems  Physical Exam Updated Vital Signs BP 118/82   Pulse 86   Temp 98.1 F (36.7 C) (Oral)   Resp 15   Ht '5\' 6"'$  (1.676 m)   Wt 74.8 kg   SpO2 97%   BMI 26.63 kg/m  Physical Exam Constitutional:      General: She is not in acute distress.    Appearance: Normal appearance.  HENT:     Head: Normocephalic.  Comments: Mild swelling and abrasion to the left maxilla.    Nose: Nose normal.  Eyes:     General:        Right eye: No discharge.        Left eye: No discharge.     Extraocular Movements: Extraocular movements intact.     Conjunctiva/sclera: Conjunctivae normal.     Pupils: Pupils are equal, round, and reactive to light.  Cardiovascular:     Rate and Rhythm: Normal rate.  Pulmonary:     Effort: Pulmonary effort is normal.  Musculoskeletal:        General: Normal range of motion.     Cervical back: Normal range of motion.     Comments: Full range of motion of bilateral shoulders elbows wrists knees hips and ankles without pain.  Patient ambulatory with a nonantalgic  gait.  Mild tenderness in the left lateral ribs.  Neurological:     General: No focal deficit present.     Mental Status: She is alert. Mental status is at baseline.    ED Results / Procedures / Treatments   Labs (all labs ordered are listed, but only abnormal results are displayed) Labs Reviewed - No data to display  EKG None  Radiology DG Ribs Unilateral W/Chest Left  Result Date: 08/19/2021 CLINICAL DATA:  Fall, patient has fallen a couple of times in last week. Left lateral rib pain. EXAM: LEFT RIBS AND CHEST - 3+ VIEW COMPARISON:  None Available. FINDINGS: No fracture or other bone lesions are seen involving the ribs. There is no evidence of pneumothorax or pleural effusion. Both lungs are clear. Heart size and mediastinal contours are within normal limits. Loop recorder overlying the left hemithorax. IMPRESSION: Negative. Electronically Signed   By: Keane Police D.O.   On: 08/19/2021 14:37   CT Head Wo Contrast  Result Date: 08/19/2021 CLINICAL DATA:  Trauma, fall EXAM: CT HEAD WITHOUT CONTRAST TECHNIQUE: Contiguous axial images were obtained from the base of the skull through the vertex without intravenous contrast. RADIATION DOSE REDUCTION: This exam was performed according to the departmental dose-optimization program which includes automated exposure control, adjustment of the mA and/or kV according to patient size and/or use of iterative reconstruction technique. COMPARISON:  10/22/2020 FINDINGS: Brain: No acute intracranial findings are seen in noncontrast CT brain. There are no signs of bleeding within the cranium. Cortical sulci are prominent. There is decreased density in the periventricular white matter. Vascular: Unremarkable. Skull: No fracture is seen in the calvarium. Sinuses/Orbits: Unremarkable. Other: None. IMPRESSION: No acute intracranial findings are seen in noncontrast CT brain. Small-vessel disease. Electronically Signed   By: Elmer Picker M.D.   On: 08/19/2021  14:07   CT Maxillofacial Wo Contrast  Result Date: 08/19/2021 CLINICAL DATA:  Fall, facial trauma, left-sided facial pain. EXAM: CT MAXILLOFACIAL WITHOUT CONTRAST TECHNIQUE: Multidetector CT imaging of the maxillofacial structures was performed. Multiplanar CT image reconstructions were also generated. RADIATION DOSE REDUCTION: This exam was performed according to the departmental dose-optimization program which includes automated exposure control, adjustment of the mA and/or kV according to patient size and/or use of iterative reconstruction technique. COMPARISON:  None Available. FINDINGS: Osseous: No fracture or mandibular dislocation. No destructive process. Orbits: Negative. No traumatic or inflammatory finding. Sinuses: Clear. Soft tissues: Mild left maxillary soft tissue swelling without evidence of drainable fluid collection or hematoma. Limited intracranial: No significant or unexpected finding. IMPRESSION: 1.  No evidence of acute fracture or dislocation. 2. Mild left maxillary soft tissue swelling. No  drainable fluid collection or hematoma. Electronically Signed   By: Keane Police D.O.   On: 08/19/2021 14:16    Procedures Procedures    Medications Ordered in ED Medications - No data to display  ED Course/ Medical Decision Making/ A&P                           Medical Decision Making Amount and/or Complexity of Data Reviewed Radiology: ordered.   Review of records shows office visit July 06, 2021 for retinal vein occlusion.  Cardiac monitoring shows sinus rhythm.  Diagnoses include imaging these are unremarkable.  No acute fracture or dislocation or foreign body noted.  Patient declined pain medications.  She is ambulatory here without assistance, no longer having any symptoms of lightheadedness or dizziness, vital signs remained stable, discharged home in stable condition.        Final Clinical Impression(s) / ED Diagnoses Final diagnoses:  Fall, initial encounter     Rx / DC Orders ED Discharge Orders     None         Luna Fuse, MD 08/19/21 1527

## 2021-08-20 ENCOUNTER — Emergency Department (HOSPITAL_COMMUNITY): Payer: Medicare HMO

## 2021-08-20 ENCOUNTER — Encounter (HOSPITAL_COMMUNITY): Payer: Self-pay

## 2021-08-20 ENCOUNTER — Encounter (INDEPENDENT_AMBULATORY_CARE_PROVIDER_SITE_OTHER): Payer: Medicare HMO | Admitting: Ophthalmology

## 2021-08-20 ENCOUNTER — Other Ambulatory Visit: Payer: Self-pay

## 2021-08-20 ENCOUNTER — Emergency Department (HOSPITAL_COMMUNITY)
Admission: EM | Admit: 2021-08-20 | Discharge: 2021-08-20 | Disposition: A | Discharge status: Home / Self Care | Payer: Medicare HMO | Attending: Emergency Medicine | Admitting: Emergency Medicine

## 2021-08-20 DIAGNOSIS — I10 Essential (primary) hypertension: Secondary | ICD-10-CM

## 2021-08-20 DIAGNOSIS — Z8719 Personal history of other diseases of the digestive system: Secondary | ICD-10-CM | POA: Diagnosis not present

## 2021-08-20 DIAGNOSIS — R109 Unspecified abdominal pain: Secondary | ICD-10-CM | POA: Diagnosis not present

## 2021-08-20 DIAGNOSIS — K921 Melena: Secondary | ICD-10-CM | POA: Insufficient documentation

## 2021-08-20 DIAGNOSIS — Z79899 Other long term (current) drug therapy: Secondary | ICD-10-CM | POA: Diagnosis not present

## 2021-08-20 DIAGNOSIS — Z961 Presence of intraocular lens: Secondary | ICD-10-CM

## 2021-08-20 DIAGNOSIS — H43822 Vitreomacular adhesion, left eye: Secondary | ICD-10-CM

## 2021-08-20 DIAGNOSIS — H26493 Other secondary cataract, bilateral: Secondary | ICD-10-CM

## 2021-08-20 DIAGNOSIS — H34831 Tributary (branch) retinal vein occlusion, right eye, with macular edema: Secondary | ICD-10-CM

## 2021-08-20 DIAGNOSIS — K5900 Constipation, unspecified: Secondary | ICD-10-CM | POA: Insufficient documentation

## 2021-08-20 DIAGNOSIS — L511 Stevens-Johnson syndrome: Secondary | ICD-10-CM

## 2021-08-20 DIAGNOSIS — H35033 Hypertensive retinopathy, bilateral: Secondary | ICD-10-CM

## 2021-08-20 LAB — COMPREHENSIVE METABOLIC PANEL
ALT: 10 U/L (ref 0–44)
AST: 20 U/L (ref 15–41)
Albumin: 3.9 g/dL (ref 3.5–5.0)
Alkaline Phosphatase: 139 U/L — ABNORMAL HIGH (ref 38–126)
Anion gap: 9 (ref 5–15)
BUN: 22 mg/dL (ref 8–23)
CO2: 20 mmol/L — ABNORMAL LOW (ref 22–32)
Calcium: 9.2 mg/dL (ref 8.9–10.3)
Chloride: 111 mmol/L (ref 98–111)
Creatinine, Ser: 1.95 mg/dL — ABNORMAL HIGH (ref 0.44–1.00)
GFR, Estimated: 27 mL/min — ABNORMAL LOW (ref >60)
Glucose, Bld: 109 mg/dL — ABNORMAL HIGH (ref 70–99)
Potassium: 4.8 mmol/L (ref 3.5–5.1)
Sodium: 140 mmol/L (ref 135–145)
Total Bilirubin: 0.5 mg/dL (ref 0.3–1.2)
Total Protein: 7.6 g/dL (ref 6.5–8.1)

## 2021-08-20 LAB — CBC WITH DIFFERENTIAL/PLATELET
Abs Immature Granulocytes: 0.07 10*3/uL (ref 0.00–0.07)
Basophils Absolute: 0 10*3/uL (ref 0.0–0.1)
Basophils Relative: 0 %
Eosinophils Absolute: 0 10*3/uL (ref 0.0–0.5)
Eosinophils Relative: 0 %
HCT: 41.1 % (ref 36.0–46.0)
Hemoglobin: 13.2 g/dL (ref 12.0–15.0)
Immature Granulocytes: 1 %
Lymphocytes Relative: 9 %
Lymphs Abs: 1.1 10*3/uL (ref 0.7–4.0)
MCH: 32.1 pg (ref 26.0–34.0)
MCHC: 32.1 g/dL (ref 30.0–36.0)
MCV: 100 fL (ref 80.0–100.0)
Monocytes Absolute: 0.6 10*3/uL (ref 0.1–1.0)
Monocytes Relative: 5 %
Neutro Abs: 10.8 10*3/uL — ABNORMAL HIGH (ref 1.7–7.7)
Neutrophils Relative %: 85 %
Platelets: 223 10*3/uL (ref 150–400)
RBC: 4.11 MIL/uL (ref 3.87–5.11)
RDW: 12.5 % (ref 11.5–15.5)
WBC: 12.7 10*3/uL — ABNORMAL HIGH (ref 4.0–10.5)
nRBC: 0 % (ref 0.0–0.2)

## 2021-08-20 LAB — LIPASE, BLOOD: Lipase: 37 U/L (ref 11–51)

## 2021-08-20 NOTE — ED Provider Triage Note (Signed)
Emergency Medicine Provider Triage Evaluation Note  Natalie Johnson , a 76 y.o. female  was evaluated in triage.  Pt complains of constipation. Was seen in the ER yesterday for falls, sent home after normal imaging.  Report feeling constipated throughout the day, this AM she tries to have BM, noticed blood when wiped.  Got lightheaded, lay on the ground for a bit and try having BM again and notice more blood.  No fever, dysuria, urinary retention or significant back pain  Review of Systems  Positive: As above Negative: As above  Physical Exam  BP (!) 117/55 (BP Location: Right Arm)   Pulse 99   Temp 97.9 F (36.6 C) (Oral)   Resp 16   Ht '5\' 6"'$  (1.676 m)   Wt 74.8 kg   SpO2 100%   BMI 26.63 kg/m  Gen:   Awake, no distress   Resp:  Normal effort  MSK:   Moves extremities without difficulty  Other:    Medical Decision Making  Medically screening exam initiated at 12:21 PM.  Appropriate orders placed.  Iliany Losier was informed that the remainder of the evaluation will be completed by another provider, this initial triage assessment does not replace that evaluation, and the importance of remaining in the ED until their evaluation is complete.     Domenic Moras, PA-C 08/20/21 1226

## 2021-08-20 NOTE — ED Provider Notes (Signed)
[facility] Provider Note   CSN: [id number] Arrival date & time: 08/20/21  1204     History  Chief Complaint  Patient presents with   Constipation    Natalie Johnson is a 76 y.o. female present emerged part with complaint of abdominal pain and constipation.  The patient was seen in the emergency department yesterday, evaluation for falls and facial contusion, head CT scan of the head and maxillofacial which showed no acute fracture or bleeding, and was discharged home after blood test.    She reports that she felt bloated all day overnight into this morning, had a bowel movement and noticed blood when she wiped.  It was bright red blood.  She has never had this problem before.  She stated that preceding the hard bowel movement she has several very loose bowel movements and cramping abdominal pain.  She also felt lightheaded at that time.  She suffers from mild anomia, and reports that she often will get lightheaded with positional changes and straining.  She reports to me that she suffers from several autoimmune conditions, including fibromyalgia, dysautonomia, and that she supposed to take fludrocortisone daily, but had run out of this for the past several weeks.  She just restarted on this medication yesterday as it was finally delivered back to her.  HPI     Home Medications Prior to Admission medications   Medication Sig Start Date End Date Taking? Authorizing Provider  amitriptyline (ELAVIL) 100 MG tablet Take 100 mg by mouth at bedtime.  12/24/16   [provider]  Bismuth Subsalicylate 782 NF/62ZH SUSP Take by mouth 4 (four) times daily. 1 teaspoon    [provider]  clonazePAM (KLONOPIN) 1 MG tablet Take 1 mg by mouth at bedtime as needed for anxiety.     [provider]  clonazePAM (KLONOPIN) 2 MG tablet TAKE 1 2 TO 1 (ONE HALF TO ONE) TABLET BY MOUTH AT BEDTIME AS NEEDED FOR SLEEP Patient not taking: Reported  on 07/06/2021 11/05/18   [provider]  DULoxetine (CYMBALTA) 30 MG capsule Take 30 mg by mouth 2 (two) times daily. 05/24/18   [provider]  fludrocortisone (FLORINEF) 0.1 MG tablet Take 0.1-0.2 mg by mouth 3 (three) times daily.     [provider]  levothyroxine (SYNTHROID) 125 MCG tablet  12/04/20   [provider]  levothyroxine (SYNTHROID, LEVOTHROID) 100 MCG tablet Take 100 mcg by mouth daily before breakfast.  12/24/16   [provider]  lovastatin (MEVACOR) 40 MG tablet Take 40 mg by mouth at bedtime.  12/24/16   [provider]  midodrine (PROAMATINE) 2.5 MG tablet Take 1 tablet (2.5 mg total) by mouth 2 (two) times daily with a meal. 08/28/20   Patel, Donika K, DO  omeprazole (PRILOSEC) 40 MG capsule Take 40 mg by mouth daily.  12/24/16   [provider]  PFIZER COVID-19 VAC BIVALENT injection  01/05/21   [provider]  potassium chloride (K-DUR) 10 MEQ tablet Take 20 mEq by mouth 2 (two) times daily. Takes 2 in am and 1 at bedtime. 01/19/17   [provider]  pyridostigmine (MESTINON) 60 MG tablet Take 1 tablet (60 mg total) by mouth 3 (three) times daily. 03/05/19   Marcial Pacas, MD  rizatriptan (MAXALT) 10 MG tablet Take 10 mg by mouth as needed for migraine.  12/24/16   [provider]      Allergies    Augmentin [amoxicillin-pot clavulanate], Trihexyphenidyl  hcl, Vancomycin, Amoxicillin, Penicillins, Piperacillin, Sodium acetylsalicylate [aspirin], Soma [carisoprodol], and Linezolid    Review of Systems   Review of Systems  Physical Exam Updated Vital Signs BP 130/80 (BP Location: Right Arm)   Pulse 85   Temp 98.7 F (37.1 C) (Oral)   Resp 16   Ht '5\' 6"'$  (1.676 m)   Wt 74.8 kg   SpO2 100%   BMI 26.63 kg/m  Physical Exam Constitutional:      General: She is not in acute distress. HENT:     Head: Normocephalic and atraumatic.  Eyes:     Conjunctiva/sclera: Conjunctivae normal.      Pupils: Pupils are equal, round, and reactive to light.  Cardiovascular:     Rate and Rhythm: Normal rate and regular rhythm.  Pulmonary:     Effort: Pulmonary effort is normal. No respiratory distress.  Abdominal:     General: There is no distension.     Tenderness: There is no abdominal tenderness.  Skin:    General: Skin is warm and dry.  Neurological:     General: No focal deficit present.     Mental Status: She is alert. Mental status is at baseline.  Psychiatric:        Mood and Affect: Mood normal.        Behavior: Behavior normal.    ED Results / Procedures / Treatments   Labs (all labs ordered are listed, but only abnormal results are displayed) Labs Reviewed  CBC WITH DIFFERENTIAL/PLATELET - Abnormal; Notable for the following components:      Result Value   WBC 12.7 (*)    Neutro Abs 10.8 (*)    All other components within normal limits  COMPREHENSIVE METABOLIC PANEL - Abnormal; Notable for the following components:   CO2 20 (*)    Glucose, Bld 109 (*)    Creatinine, Ser 1.95 (*)    Alkaline Phosphatase 139 (*)    GFR, Estimated 27 (*)    All other components within normal limits  LIPASE, BLOOD    EKG None  Radiology CT ABDOMEN PELVIS WO CONTRAST  Result Date: 08/20/2021 CLINICAL DATA:  Acute abdominal pain and cramping.  Blood in stool. EXAM: CT ABDOMEN AND PELVIS WITHOUT CONTRAST TECHNIQUE: Multidetector CT imaging of the abdomen and pelvis was performed following the standard protocol without IV contrast. RADIATION DOSE REDUCTION: This exam was performed according to the departmental dose-optimization program which includes automated exposure control, adjustment of the mA and/or kV according to patient size and/or use of iterative reconstruction technique. COMPARISON:  03/16/2018 FINDINGS: Lower chest: No acute findings. Hepatobiliary: No mass visualized on this unenhanced exam. Prior cholecystectomy. No evidence of biliary obstruction. Pancreas: No mass or  inflammatory process visualized on this unenhanced exam. Spleen:  Within normal limits in size. Adrenals/Urinary tract: No evidence of urolithiasis or hydronephrosis. Unremarkable unopacified urinary bladder. Stomach/Bowel: Small hiatal hernia is noted. No evidence of obstruction, inflammatory process, or abnormal fluid collections. Vascular/Lymphatic: No pathologically enlarged lymph nodes identified. No evidence of abdominal aortic aneurysm. Reproductive: Prior hysterectomy noted. Adnexal regions are unremarkable in appearance. Other:  None. Musculoskeletal:  No suspicious bone lesions identified. IMPRESSION: No evidence of urolithiasis, hydronephrosis, or other acute findings. Small hiatal hernia. Electronically Signed   By: Marlaine Hind M.D.   On: 08/20/2021 17:39   DG Ribs Unilateral W/Chest Left  Result Date: 08/19/2021 CLINICAL DATA:  Fall, patient has fallen a couple of times in last week. Left lateral rib pain. EXAM: LEFT RIBS AND  CHEST - 3+ VIEW COMPARISON:  None Available. FINDINGS: No fracture or other bone lesions are seen involving the ribs. There is no evidence of pneumothorax or pleural effusion. Both lungs are clear. Heart size and mediastinal contours are within normal limits. Loop recorder overlying the left hemithorax. IMPRESSION: Negative. Electronically Signed   By: Keane Police D.O.   On: 08/19/2021 14:37   CT Head Wo Contrast  Result Date: 08/19/2021 CLINICAL DATA:  Trauma, fall EXAM: CT HEAD WITHOUT CONTRAST TECHNIQUE: Contiguous axial images were obtained from the base of the skull through the vertex without intravenous contrast. RADIATION DOSE REDUCTION: This exam was performed according to the departmental dose-optimization program which includes automated exposure control, adjustment of the mA and/or kV according to patient size and/or use of iterative reconstruction technique. COMPARISON:  10/22/2020 FINDINGS: Brain: No acute intracranial findings are seen in noncontrast CT brain.  There are no signs of bleeding within the cranium. Cortical sulci are prominent. There is decreased density in the periventricular white matter. Vascular: Unremarkable. Skull: No fracture is seen in the calvarium. Sinuses/Orbits: Unremarkable. Other: None. IMPRESSION: No acute intracranial findings are seen in noncontrast CT brain. Small-vessel disease. Electronically Signed   By: Elmer Picker M.D.   On: 08/19/2021 14:07   DG ABD ACUTE 2+V W 1V CHEST  Result Date: 08/20/2021 CLINICAL DATA:  Constipation, lightheadedness, blood on toilet paper after trying to go to the bathroom, history hypertension, pancreatitis, GERD EXAM: DG ABDOMEN ACUTE WITH 1 VIEW CHEST COMPARISON:  Chest radiograph 08/19/2021 FINDINGS: Normal heart size, mediastinal contours, and pulmonary vascularity. Loop recorder projects over mid chest. Lungs clear. No infiltrate, pleural effusion, or pneumothorax. Surgical clips RIGHT upper quadrant question cholecystectomy. Nonobstructive bowel gas pattern with normal retained stool burden. No bowel obstruction, bowel dilatation or free air. No urinary tract calcification. IMPRESSION: No acute abnormalities. Electronically Signed   By: Lavonia Dana M.D.   On: 08/20/2021 12:58   CT Maxillofacial Wo Contrast  Result Date: 08/19/2021 CLINICAL DATA:  Fall, facial trauma, left-sided facial pain. EXAM: CT MAXILLOFACIAL WITHOUT CONTRAST TECHNIQUE: Multidetector CT imaging of the maxillofacial structures was performed. Multiplanar CT image reconstructions were also generated. RADIATION DOSE REDUCTION: This exam was performed according to the departmental dose-optimization program which includes automated exposure control, adjustment of the mA and/or kV according to patient size and/or use of iterative reconstruction technique. COMPARISON:  None Available. FINDINGS: Osseous: No fracture or mandibular dislocation. No destructive process. Orbits: Negative. No traumatic or inflammatory finding. Sinuses:  Clear. Soft tissues: Mild left maxillary soft tissue swelling without evidence of drainable fluid collection or hematoma. Limited intracranial: No significant or unexpected finding. IMPRESSION: 1.  No evidence of acute fracture or dislocation. 2. Mild left maxillary soft tissue swelling. No drainable fluid collection or hematoma. Electronically Signed   By: Keane Police D.O.   On: 08/19/2021 14:16    Procedures Procedures    Medications Ordered in ED Medications - No data to display  ED Course/ Medical Decision Making/ A&P Clinical Course as of 08/20/21 1829  Fri Aug 20, 2021  1752 Unremarkable CT scan [MT]    Clinical Course User Index [MT] Langston Masker Carola Rhine, MD                           Medical Decision Making Amount and/or Complexity of Data Reviewed Radiology: ordered.   This patient presents to the ED with concern for abdominal bloating, constipation. This involves an extensive number of  treatment options, and is a complaint that carries with it a high risk of complications and morbidity.  The differential diagnosis includes SBO vs ileus vs functional constipation versus hemorrhoids versus diverticulitis versus other  I ordered and personally interpreted labs.  The pertinent results include: Hemoglobin is flat, stable and normal compared to yesterday.  She does have some increase in her white blood cell count from yesterday, which may be related to her resumption of fludrocortisone, however given her cramping abdominal pain with initial diarrhea and now constipation, I think a CT scan to evaluate for diverticulitis would be reasonable.  I suspect her lightheadedness is related to combination of her dysautonomia and being off of her chronic steroid medications, which she now has a refill of and can resume.  She will take her next dose here in the ER.  Her blood pressure is soft which is consistent with her chronic blood pressures.  She is otherwise mentating well.  I have a lower  suspicion for acute PE.  I ordered imaging studies including CT abdomen pelvis. I independently visualized and interpreted imaging which showed no acute findings explain the patient's symptoms.  Specifically no evidence of diverticulitis or colitis. I agree with the radiologist interpretation   After the interventions noted above, I reevaluated the patient and found that they have: stayed the same  Dispostion:  After consideration of the diagnostic results and the patients response to treatment, I feel that the patent would benefit from outpatient PCP follow-up.         Final Clinical Impression(s) / ED Diagnoses Final diagnoses:  Blood in stool    Rx / DC Orders ED Discharge Orders     None         Wyvonnia Dusky, MD 08/20/21 [phone]

## 2021-08-20 NOTE — ED Notes (Signed)
Witness fall in the lobby.... safety portal has been placed and charge RN has been notified

## 2021-08-20 NOTE — ED Notes (Signed)
Discharge instructions reviewed with patient. Patient verbalized understanding of instructions. Follow-up care and medications were reviewed. Patient ambulatory with steady gait. VSS upon discharge.  ?

## 2021-08-20 NOTE — ED Triage Notes (Signed)
Patients reports she was here yesterday for fall but whenshe got home had some difficulty having BM and got lightheaded and laid on the floor.  Got up once she felt better and then had to try to use the bathroom again and notice blood on toilet paper.  Also complains of abd pain
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
## 2021-08-24 ENCOUNTER — Encounter (INDEPENDENT_AMBULATORY_CARE_PROVIDER_SITE_OTHER): Payer: Medicare HMO | Admitting: Ophthalmology

## 2021-08-25 ENCOUNTER — Encounter (INDEPENDENT_AMBULATORY_CARE_PROVIDER_SITE_OTHER): Payer: Medicare HMO | Admitting: Ophthalmology

## 2021-08-25 DIAGNOSIS — Z961 Presence of intraocular lens: Secondary | ICD-10-CM

## 2021-08-25 DIAGNOSIS — L511 Stevens-Johnson syndrome: Secondary | ICD-10-CM

## 2021-08-25 DIAGNOSIS — H26493 Other secondary cataract, bilateral: Secondary | ICD-10-CM

## 2021-08-25 DIAGNOSIS — H34831 Tributary (branch) retinal vein occlusion, right eye, with macular edema: Secondary | ICD-10-CM

## 2021-08-25 DIAGNOSIS — I10 Essential (primary) hypertension: Secondary | ICD-10-CM

## 2021-08-25 DIAGNOSIS — H35033 Hypertensive retinopathy, bilateral: Secondary | ICD-10-CM

## 2021-08-25 DIAGNOSIS — H43822 Vitreomacular adhesion, left eye: Secondary | ICD-10-CM

## 2021-08-27 ENCOUNTER — Encounter (INDEPENDENT_AMBULATORY_CARE_PROVIDER_SITE_OTHER): Payer: Medicare HMO | Admitting: Ophthalmology

## 2021-08-30 ENCOUNTER — Encounter (INDEPENDENT_AMBULATORY_CARE_PROVIDER_SITE_OTHER): Payer: Self-pay | Admitting: Ophthalmology

## 2021-08-30 ENCOUNTER — Ambulatory Visit (INDEPENDENT_AMBULATORY_CARE_PROVIDER_SITE_OTHER): Payer: Medicare HMO | Admitting: Ophthalmology

## 2021-08-30 DIAGNOSIS — L511 Stevens-Johnson syndrome: Secondary | ICD-10-CM

## 2021-08-30 DIAGNOSIS — Z961 Presence of intraocular lens: Secondary | ICD-10-CM | POA: Diagnosis not present

## 2021-08-30 DIAGNOSIS — H26493 Other secondary cataract, bilateral: Secondary | ICD-10-CM

## 2021-08-30 DIAGNOSIS — I10 Essential (primary) hypertension: Secondary | ICD-10-CM

## 2021-08-30 DIAGNOSIS — H43822 Vitreomacular adhesion, left eye: Secondary | ICD-10-CM

## 2021-08-30 DIAGNOSIS — H35033 Hypertensive retinopathy, bilateral: Secondary | ICD-10-CM

## 2021-08-30 DIAGNOSIS — H34831 Tributary (branch) retinal vein occlusion, right eye, with macular edema: Secondary | ICD-10-CM | POA: Diagnosis not present

## 2021-08-30 DIAGNOSIS — H3582 Retinal ischemia: Secondary | ICD-10-CM
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
## 2021-08-31 ENCOUNTER — Encounter (INDEPENDENT_AMBULATORY_CARE_PROVIDER_SITE_OTHER): Payer: Self-pay | Admitting: Ophthalmology

## 2021-08-31 MED ORDER — AFLIBERCEPT 2MG/0.05ML IZ SOLN FOR KALEIDOSCOPE
2.0000 mg | INTRAVITREAL | Status: AC | PRN
  Administered 2021-08-31: 2 mg via INTRAVITREAL

## 2021-09-17 DIAGNOSIS — E039 Hypothyroidism, unspecified: Secondary | ICD-10-CM | POA: Diagnosis not present

## 2021-09-17 DIAGNOSIS — G4700 Insomnia, unspecified: Secondary | ICD-10-CM | POA: Diagnosis not present

## 2021-09-17 DIAGNOSIS — F4321 Adjustment disorder with depressed mood: Secondary | ICD-10-CM | POA: Diagnosis not present

## 2021-09-17 DIAGNOSIS — M25572 Pain in left ankle and joints of left foot: Secondary | ICD-10-CM | POA: Diagnosis not present

## 2021-09-17 DIAGNOSIS — E785 Hyperlipidemia, unspecified: Secondary | ICD-10-CM | POA: Diagnosis not present
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
## 2021-10-11 ENCOUNTER — Ambulatory Visit (INDEPENDENT_AMBULATORY_CARE_PROVIDER_SITE_OTHER): Payer: Medicare HMO | Admitting: Ophthalmology

## 2021-10-11 ENCOUNTER — Encounter (INDEPENDENT_AMBULATORY_CARE_PROVIDER_SITE_OTHER): Payer: Self-pay | Admitting: Ophthalmology

## 2021-10-11 DIAGNOSIS — I10 Essential (primary) hypertension: Secondary | ICD-10-CM

## 2021-10-11 DIAGNOSIS — H35033 Hypertensive retinopathy, bilateral: Secondary | ICD-10-CM

## 2021-10-11 DIAGNOSIS — H34831 Tributary (branch) retinal vein occlusion, right eye, with macular edema: Secondary | ICD-10-CM

## 2021-10-11 DIAGNOSIS — Z961 Presence of intraocular lens: Secondary | ICD-10-CM

## 2021-10-11 DIAGNOSIS — L511 Stevens-Johnson syndrome: Secondary | ICD-10-CM

## 2021-10-11 MED ORDER — AFLIBERCEPT 2MG/0.05ML IZ SOLN FOR KALEIDOSCOPE
2.0000 mg | INTRAVITREAL | Status: AC | PRN
  Administered 2021-10-11: 2 mg via INTRAVITREAL

## 2021-11-02 DIAGNOSIS — M79672 Pain in left foot: Secondary | ICD-10-CM | POA: Diagnosis not present

## 2021-11-02 DIAGNOSIS — M79671 Pain in right foot: Secondary | ICD-10-CM | POA: Diagnosis not present

## 2021-11-23 ENCOUNTER — Encounter (INDEPENDENT_AMBULATORY_CARE_PROVIDER_SITE_OTHER): Payer: Medicare HMO | Admitting: Ophthalmology

## 2021-11-23 DIAGNOSIS — H34831 Tributary (branch) retinal vein occlusion, right eye, with macular edema: Secondary | ICD-10-CM

## 2021-11-23 DIAGNOSIS — L511 Stevens-Johnson syndrome: Secondary | ICD-10-CM

## 2021-11-23 DIAGNOSIS — H35033 Hypertensive retinopathy, bilateral: Secondary | ICD-10-CM

## 2021-11-23 DIAGNOSIS — I10 Essential (primary) hypertension: Secondary | ICD-10-CM

## 2021-11-23 DIAGNOSIS — Z961 Presence of intraocular lens: Secondary | ICD-10-CM

## 2021-12-13 DIAGNOSIS — R2681 Unsteadiness on feet: Secondary | ICD-10-CM | POA: Diagnosis not present

## 2021-12-13 DIAGNOSIS — E039 Hypothyroidism, unspecified: Secondary | ICD-10-CM | POA: Diagnosis not present

## 2021-12-13 DIAGNOSIS — I951 Orthostatic hypotension: Secondary | ICD-10-CM | POA: Diagnosis not present

## 2021-12-31 ENCOUNTER — Encounter: Payer: Self-pay | Admitting: Neurology

## 2021-12-31 ENCOUNTER — Ambulatory Visit: Payer: Medicare HMO | Admitting: Neurology

## 2021-12-31 ENCOUNTER — Encounter (INDEPENDENT_AMBULATORY_CARE_PROVIDER_SITE_OTHER): Payer: Medicare HMO | Admitting: Ophthalmology

## 2021-12-31 DIAGNOSIS — Z961 Presence of intraocular lens: Secondary | ICD-10-CM

## 2021-12-31 DIAGNOSIS — H34831 Tributary (branch) retinal vein occlusion, right eye, with macular edema: Secondary | ICD-10-CM

## 2021-12-31 DIAGNOSIS — Z029 Encounter for administrative examinations, unspecified: Secondary | ICD-10-CM

## 2021-12-31 DIAGNOSIS — H35033 Hypertensive retinopathy, bilateral: Secondary | ICD-10-CM

## 2021-12-31 DIAGNOSIS — I10 Essential (primary) hypertension: Secondary | ICD-10-CM

## 2021-12-31 DIAGNOSIS — L511 Stevens-Johnson syndrome: Secondary | ICD-10-CM

## 2022-01-04 ENCOUNTER — Encounter (INDEPENDENT_AMBULATORY_CARE_PROVIDER_SITE_OTHER): Payer: Medicare HMO | Admitting: Ophthalmology

## 2022-01-04 ENCOUNTER — Encounter (INDEPENDENT_AMBULATORY_CARE_PROVIDER_SITE_OTHER): Payer: Self-pay
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
## 2022-01-11 ENCOUNTER — Encounter (INDEPENDENT_AMBULATORY_CARE_PROVIDER_SITE_OTHER): Payer: Self-pay | Admitting: Ophthalmology

## 2022-01-11 ENCOUNTER — Ambulatory Visit (INDEPENDENT_AMBULATORY_CARE_PROVIDER_SITE_OTHER): Payer: Medicare HMO | Admitting: Ophthalmology

## 2022-01-11 DIAGNOSIS — Z961 Presence of intraocular lens: Secondary | ICD-10-CM

## 2022-01-11 DIAGNOSIS — H35033 Hypertensive retinopathy, bilateral: Secondary | ICD-10-CM

## 2022-01-11 DIAGNOSIS — H34831 Tributary (branch) retinal vein occlusion, right eye, with macular edema: Secondary | ICD-10-CM | POA: Diagnosis not present

## 2022-01-11 DIAGNOSIS — I10 Essential (primary) hypertension: Secondary | ICD-10-CM | POA: Diagnosis not present

## 2022-01-11 DIAGNOSIS — L511 Stevens-Johnson syndrome: Secondary | ICD-10-CM

## 2022-01-11 MED ORDER — AFLIBERCEPT 2MG/0.05ML IZ SOLN FOR KALEIDOSCOPE
2.0000 mg | INTRAVITREAL | Status: AC | PRN
  Administered 2022-01-11: 2 mg via INTRAVITREAL

## 2022-02-04 DIAGNOSIS — R21 Rash and other nonspecific skin eruption: Secondary | ICD-10-CM | POA: Diagnosis not present

## 2022-02-04 DIAGNOSIS — Z23 Encounter for immunization: Secondary | ICD-10-CM | POA: Diagnosis not present

## 2022-02-04 DIAGNOSIS — B3789 Other sites of candidiasis: Secondary | ICD-10-CM | POA: Diagnosis not present
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
## 2022-02-22 ENCOUNTER — Encounter (INDEPENDENT_AMBULATORY_CARE_PROVIDER_SITE_OTHER): Payer: Medicare HMO | Admitting: Ophthalmology

## 2022-02-22 DIAGNOSIS — H34831 Tributary (branch) retinal vein occlusion, right eye, with macular edema: Secondary | ICD-10-CM

## 2022-02-22 DIAGNOSIS — H35033 Hypertensive retinopathy, bilateral: Secondary | ICD-10-CM

## 2022-02-22 DIAGNOSIS — I10 Essential (primary) hypertension: Secondary | ICD-10-CM

## 2022-02-22 DIAGNOSIS — Z961 Presence of intraocular lens: Secondary | ICD-10-CM

## 2022-02-22 DIAGNOSIS — L511 Stevens-Johnson syndrome: Secondary | ICD-10-CM

## 2022-03-08 ENCOUNTER — Encounter (INDEPENDENT_AMBULATORY_CARE_PROVIDER_SITE_OTHER): Payer: Medicare HMO | Admitting: Ophthalmology
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
## 2022-03-10 ENCOUNTER — Encounter (INDEPENDENT_AMBULATORY_CARE_PROVIDER_SITE_OTHER): Payer: Medicare HMO | Admitting: Ophthalmology

## 2022-03-10 DIAGNOSIS — H35033 Hypertensive retinopathy, bilateral: Secondary | ICD-10-CM

## 2022-03-10 DIAGNOSIS — I10 Essential (primary) hypertension: Secondary | ICD-10-CM

## 2022-03-10 DIAGNOSIS — H43822 Vitreomacular adhesion, left eye: Secondary | ICD-10-CM

## 2022-03-10 DIAGNOSIS — Z961 Presence of intraocular lens: Secondary | ICD-10-CM

## 2022-03-10 DIAGNOSIS — H3582 Retinal ischemia: Secondary | ICD-10-CM

## 2022-03-10 DIAGNOSIS — H26493 Other secondary cataract, bilateral: Secondary | ICD-10-CM

## 2022-03-10 DIAGNOSIS — L511 Stevens-Johnson syndrome: Secondary | ICD-10-CM

## 2022-03-10 DIAGNOSIS — H34831 Tributary (branch) retinal vein occlusion, right eye, with macular edema: Secondary | ICD-10-CM
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
## 2022-03-17 ENCOUNTER — Encounter (INDEPENDENT_AMBULATORY_CARE_PROVIDER_SITE_OTHER): Payer: Medicare HMO | Admitting: Ophthalmology

## 2022-03-17 DIAGNOSIS — Z961 Presence of intraocular lens: Secondary | ICD-10-CM

## 2022-03-17 DIAGNOSIS — H43822 Vitreomacular adhesion, left eye: Secondary | ICD-10-CM

## 2022-03-17 DIAGNOSIS — L511 Stevens-Johnson syndrome: Secondary | ICD-10-CM

## 2022-03-17 DIAGNOSIS — H26493 Other secondary cataract, bilateral: Secondary | ICD-10-CM

## 2022-03-17 DIAGNOSIS — H35033 Hypertensive retinopathy, bilateral: Secondary | ICD-10-CM

## 2022-03-17 DIAGNOSIS — I10 Essential (primary) hypertension: Secondary | ICD-10-CM

## 2022-03-17 DIAGNOSIS — H34831 Tributary (branch) retinal vein occlusion, right eye, with macular edema: Secondary | ICD-10-CM

## 2022-03-17 DIAGNOSIS — H3582 Retinal ischemia: Secondary | ICD-10-CM

## 2022-03-25 DIAGNOSIS — Z0100 Encounter for examination of eyes and vision without abnormal findings: Secondary | ICD-10-CM | POA: Diagnosis not present
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
## 2022-04-17 IMAGING — CT CT FOOT*L* W/O CM
3 series · 12 of 33 positions shown, 14 images · non-contrast
Comparison: None.

CLINICAL DATA: Fell and twisted left foot 1 week ago.

EXAM:
CT OF THE LEFT FOOT WITHOUT CONTRAST
TECHNIQUE: Multidetector CT imaging of the left foot was performed according to
the standard protocol. Multiplanar CT image reconstructions were
also generated.
RADIATION DOSE REDUCTION: This exam was performed according to the
departmental dose-optimization program which includes automated
exposure control, adjustment of the mA and/or kV according to
patient size and/or use of iterative reconstruction technique.

[Series 4: soft tissue lower extremity · axial · 0.58mm/px · z∈[-193,-89]mm · 4 of 76 slices shown, 5 images]
[im 12/76  soft-tissue]
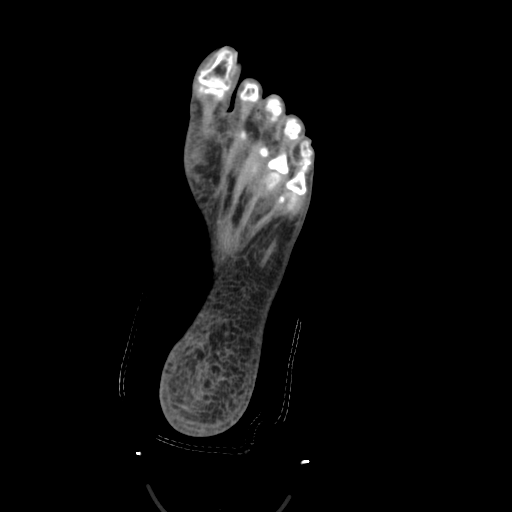
[im 12/76  bone]
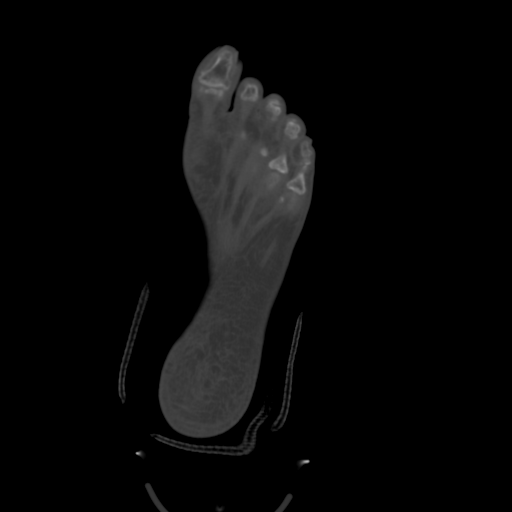
[im 29/76  bone]
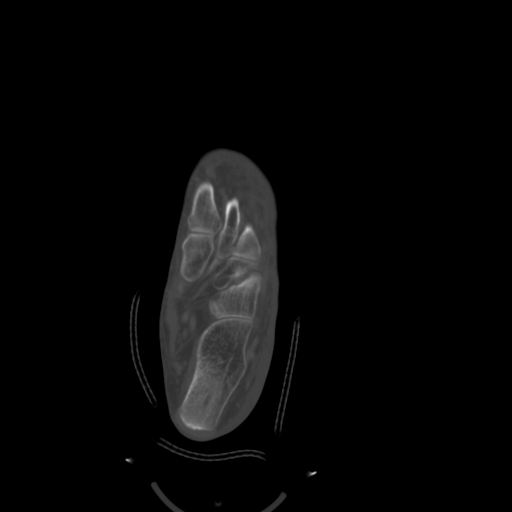
[im 47/76  bone]
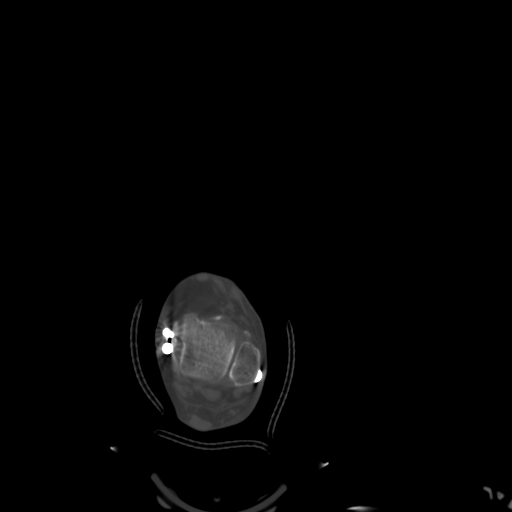
[im 64/76  bone]
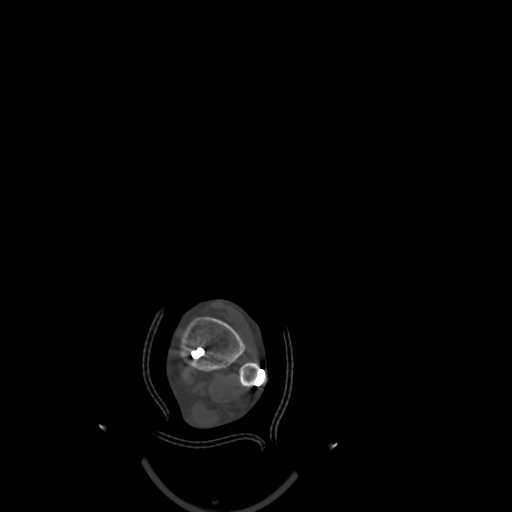

[Series 9: cor soft tissue · coronal · 0.25mm/px · 3 of 124 slices shown]
[im 25/124  bone]
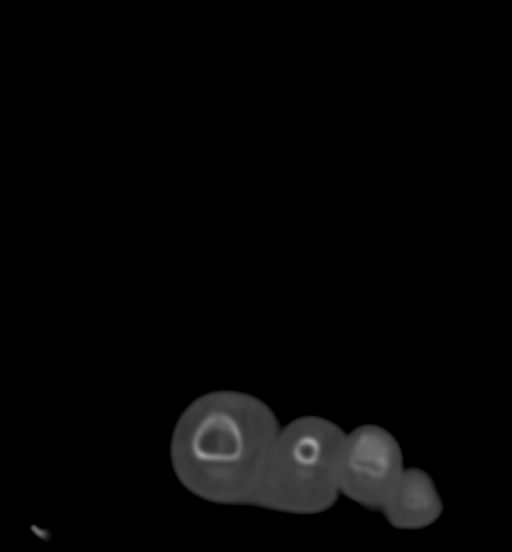
[im 50/124  bone]
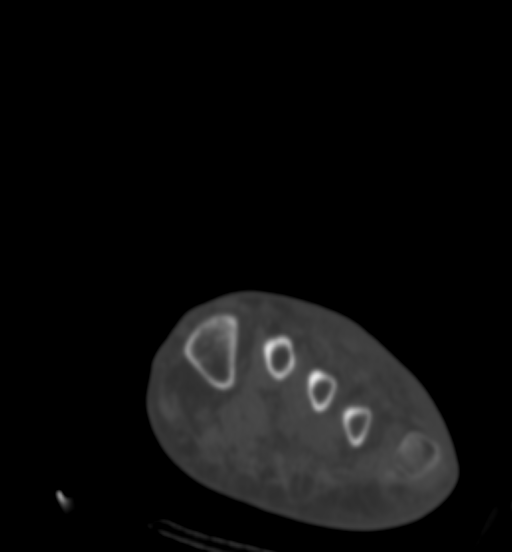
[im 74/124  bone]
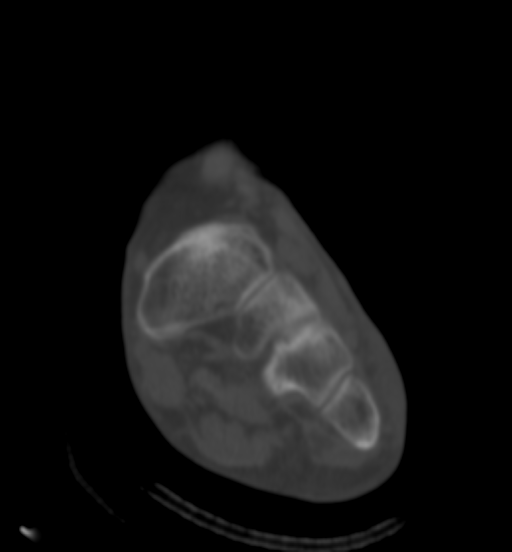

[Series 10: sagsoft tissue · sagittal · 0.29mm/px · 5 of 50 slices shown, 6 images]
[im 17/50  bone]
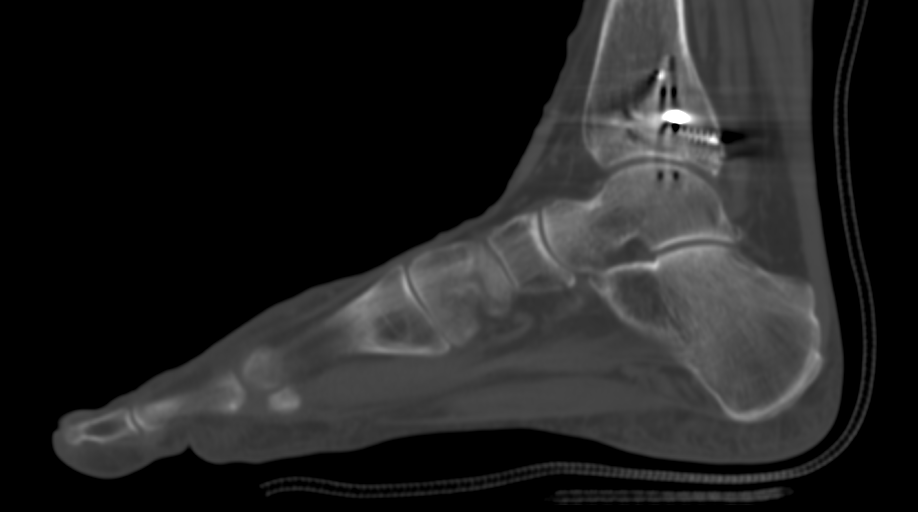
[im 21/50  bone]
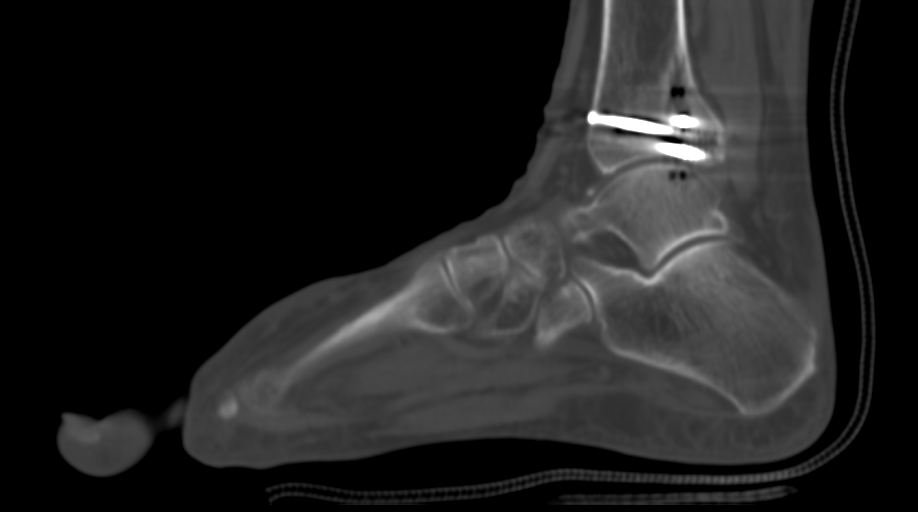
[im 25/50  soft-tissue]
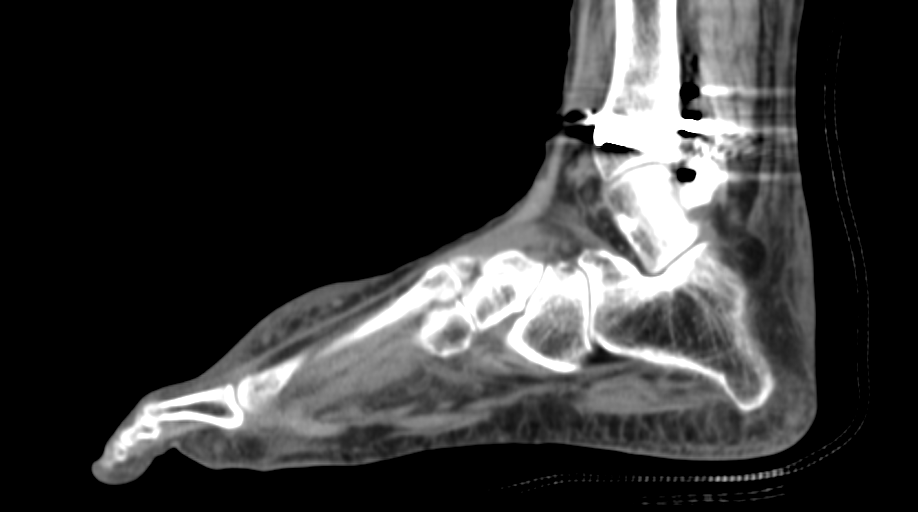
[im 25/50  bone]
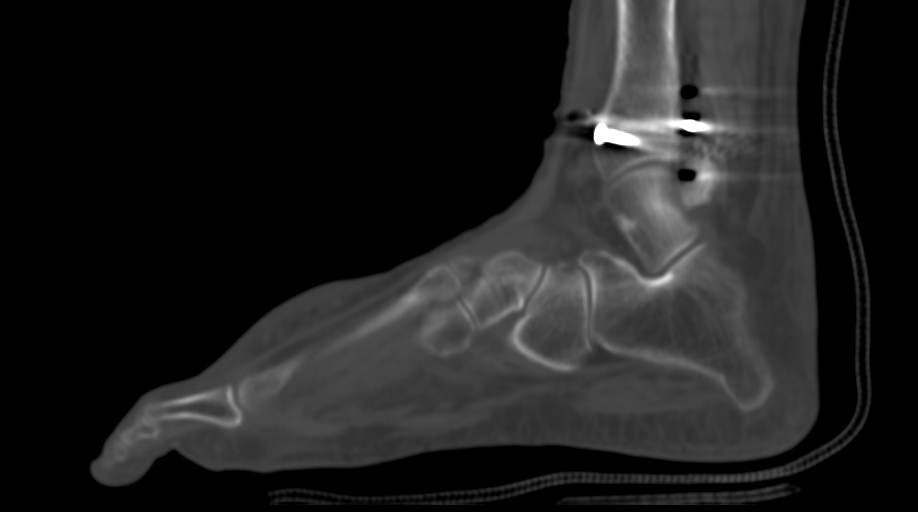
[im 29/50  bone]
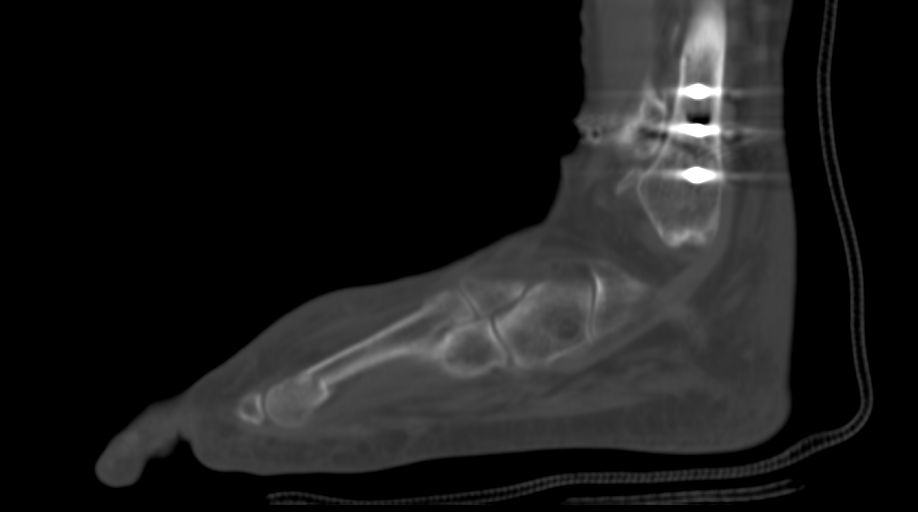
[im 33/50  bone]
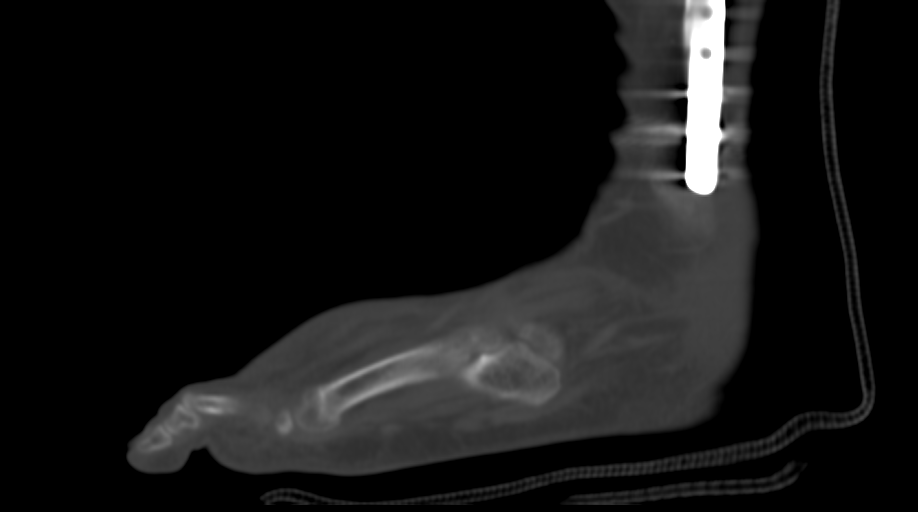

[12 of 33 positions shown; findings below may reference images not displayed]

FINDINGS: Bones/Joint/Cartilage

Mildly displaced fracture of the distal third metatarsal with
inferior displacement and 4 mm overlap of the distal fracture
fragment. Small osseous fragment about the anterior aspect of the
distal tibia, superior to the talar neck concerning for avulsion
fracture of indeterminate age. Plate and screw fixation of the
distal fibula as well as multiple screws in the distal tibia. No
perihardware loosening. The hardware appears intact.

Ligaments

Limited evaluation of the ligaments on CT examination.

Muscles and Tendons

Muscles are normal in density and bulk.

Soft tissues

Marked soft tissue swelling about the dorsum of the foot. No
drainable fluid collection.
IMPRESSION: 1. Displaced fracture of the distal third metatarsal with mild
inferior displacement and 4 cm overlap of the distal fracture
fragment.

2. Small avulsion fracture of indeterminate age about anterior
aspect of the tibia, without surrounding edema, likely a chronic
process. Correlate with localized pain.

3. Marked soft tissue swelling of the dorsum of the foot about the
third metatarsal fracture.

4. Plate and screw fixation of the distal fibula and multiple screws
in the distal tibia without evidence of complications.

## 2022-04-20 ENCOUNTER — Encounter (INDEPENDENT_AMBULATORY_CARE_PROVIDER_SITE_OTHER): Payer: Self-pay | Admitting: Ophthalmology

## 2022-04-20 ENCOUNTER — Ambulatory Visit (INDEPENDENT_AMBULATORY_CARE_PROVIDER_SITE_OTHER): Payer: Medicare HMO | Admitting: Ophthalmology

## 2022-04-20 DIAGNOSIS — I10 Essential (primary) hypertension: Secondary | ICD-10-CM | POA: Diagnosis not present

## 2022-04-20 DIAGNOSIS — H34831 Tributary (branch) retinal vein occlusion, right eye, with macular edema: Secondary | ICD-10-CM

## 2022-04-20 DIAGNOSIS — H35033 Hypertensive retinopathy, bilateral: Secondary | ICD-10-CM

## 2022-04-20 DIAGNOSIS — L511 Stevens-Johnson syndrome: Secondary | ICD-10-CM

## 2022-04-20 DIAGNOSIS — H43822 Vitreomacular adhesion, left eye: Secondary | ICD-10-CM

## 2022-04-20 DIAGNOSIS — H26493 Other secondary cataract, bilateral: Secondary | ICD-10-CM

## 2022-04-20 DIAGNOSIS — Z961 Presence of intraocular lens: Secondary | ICD-10-CM

## 2022-04-20 DIAGNOSIS — H3582 Retinal ischemia: Secondary | ICD-10-CM

## 2022-04-20 MED ORDER — AFLIBERCEPT 2MG/0.05ML IZ SOLN FOR KALEIDOSCOPE
2.0000 mg | INTRAVITREAL | Status: AC | PRN
  Administered 2022-04-20: 2 mg via INTRAVITREAL
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
## 2022-05-18 ENCOUNTER — Encounter (INDEPENDENT_AMBULATORY_CARE_PROVIDER_SITE_OTHER): Payer: Medicare HMO | Admitting: Ophthalmology

## 2022-05-18 DIAGNOSIS — H43822 Vitreomacular adhesion, left eye: Secondary | ICD-10-CM

## 2022-05-18 DIAGNOSIS — L511 Stevens-Johnson syndrome: Secondary | ICD-10-CM

## 2022-05-18 DIAGNOSIS — Z961 Presence of intraocular lens: Secondary | ICD-10-CM

## 2022-05-18 DIAGNOSIS — H34831 Tributary (branch) retinal vein occlusion, right eye, with macular edema: Secondary | ICD-10-CM

## 2022-05-18 DIAGNOSIS — H26493 Other secondary cataract, bilateral: Secondary | ICD-10-CM

## 2022-05-18 DIAGNOSIS — I10 Essential (primary) hypertension: Secondary | ICD-10-CM

## 2022-05-18 DIAGNOSIS — H35033 Hypertensive retinopathy, bilateral: Secondary | ICD-10-CM

## 2022-05-19 ENCOUNTER — Encounter (INDEPENDENT_AMBULATORY_CARE_PROVIDER_SITE_OTHER): Payer: Medicare HMO | Admitting: Ophthalmology
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
## 2022-06-02 ENCOUNTER — Ambulatory Visit (INDEPENDENT_AMBULATORY_CARE_PROVIDER_SITE_OTHER): Payer: Medicare HMO | Admitting: Ophthalmology

## 2022-06-02 ENCOUNTER — Encounter (INDEPENDENT_AMBULATORY_CARE_PROVIDER_SITE_OTHER): Payer: Self-pay | Admitting: Ophthalmology

## 2022-06-02 DIAGNOSIS — L511 Stevens-Johnson syndrome: Secondary | ICD-10-CM

## 2022-06-02 DIAGNOSIS — Z961 Presence of intraocular lens: Secondary | ICD-10-CM

## 2022-06-02 DIAGNOSIS — M25571 Pain in right ankle and joints of right foot: Secondary | ICD-10-CM | POA: Diagnosis not present

## 2022-06-02 DIAGNOSIS — H43822 Vitreomacular adhesion, left eye: Secondary | ICD-10-CM

## 2022-06-02 DIAGNOSIS — I10 Essential (primary) hypertension: Secondary | ICD-10-CM

## 2022-06-02 DIAGNOSIS — H26493 Other secondary cataract, bilateral: Secondary | ICD-10-CM

## 2022-06-02 DIAGNOSIS — H34831 Tributary (branch) retinal vein occlusion, right eye, with macular edema: Secondary | ICD-10-CM

## 2022-06-02 DIAGNOSIS — H3582 Retinal ischemia: Secondary | ICD-10-CM

## 2022-06-02 DIAGNOSIS — H35033 Hypertensive retinopathy, bilateral: Secondary | ICD-10-CM

## 2022-06-02 DIAGNOSIS — M25572 Pain in left ankle and joints of left foot: Secondary | ICD-10-CM | POA: Diagnosis not present

## 2022-06-02 MED ORDER — AFLIBERCEPT 2MG/0.05ML IZ SOLN FOR KALEIDOSCOPE
2.0000 mg | INTRAVITREAL | Status: AC | PRN
  Administered 2022-06-02: 2 mg via INTRAVITREAL

## 2022-06-20 DIAGNOSIS — M79671 Pain in right foot: Secondary | ICD-10-CM | POA: Diagnosis not present

## 2022-06-20 DIAGNOSIS — M79672 Pain in left foot: Secondary | ICD-10-CM | POA: Diagnosis not present

## 2022-06-20 DIAGNOSIS — M25572 Pain in left ankle and joints of left foot: Secondary | ICD-10-CM | POA: Diagnosis not present

## 2022-06-20 DIAGNOSIS — M25571 Pain in right ankle and joints of right foot: Secondary | ICD-10-CM | POA: Diagnosis not present

## 2022-07-05 DIAGNOSIS — M25572 Pain in left ankle and joints of left foot: Secondary | ICD-10-CM | POA: Diagnosis not present

## 2022-07-05 DIAGNOSIS — M25571 Pain in right ankle and joints of right foot: Secondary | ICD-10-CM | POA: Diagnosis not present

## 2022-07-08 DIAGNOSIS — M25571 Pain in right ankle and joints of right foot: Secondary | ICD-10-CM | POA: Diagnosis not present

## 2022-07-08 DIAGNOSIS — H47299 Other optic atrophy, unspecified eye: Secondary | ICD-10-CM | POA: Diagnosis not present

## 2022-07-08 DIAGNOSIS — Z0100 Encounter for examination of eyes and vision without abnormal findings: Secondary | ICD-10-CM | POA: Diagnosis not present

## 2022-07-08 DIAGNOSIS — M25572 Pain in left ankle and joints of left foot: Secondary | ICD-10-CM | POA: Diagnosis not present

## 2022-07-11 DIAGNOSIS — M25572 Pain in left ankle and joints of left foot: Secondary | ICD-10-CM | POA: Diagnosis not present

## 2022-07-11 DIAGNOSIS — M25571 Pain in right ankle and joints of right foot: Secondary | ICD-10-CM | POA: Diagnosis not present

## 2022-07-15 ENCOUNTER — Encounter (INDEPENDENT_AMBULATORY_CARE_PROVIDER_SITE_OTHER): Payer: Medicare HMO | Admitting: Ophthalmology

## 2022-07-15 DIAGNOSIS — H35033 Hypertensive retinopathy, bilateral: Secondary | ICD-10-CM

## 2022-07-15 DIAGNOSIS — H34831 Tributary (branch) retinal vein occlusion, right eye, with macular edema: Secondary | ICD-10-CM

## 2022-07-15 DIAGNOSIS — H26493 Other secondary cataract, bilateral: Secondary | ICD-10-CM

## 2022-07-15 DIAGNOSIS — Z961 Presence of intraocular lens: Secondary | ICD-10-CM

## 2022-07-15 DIAGNOSIS — I10 Essential (primary) hypertension: Secondary | ICD-10-CM

## 2022-07-15 DIAGNOSIS — H3582 Retinal ischemia: Secondary | ICD-10-CM

## 2022-07-15 DIAGNOSIS — L511 Stevens-Johnson syndrome: Secondary | ICD-10-CM

## 2022-07-15 DIAGNOSIS — H43822 Vitreomacular adhesion, left eye: Secondary | ICD-10-CM

## 2022-07-19 DIAGNOSIS — M25571 Pain in right ankle and joints of right foot: Secondary | ICD-10-CM | POA: Diagnosis not present

## 2022-07-19 DIAGNOSIS — E538 Deficiency of other specified B group vitamins: Secondary | ICD-10-CM | POA: Diagnosis not present

## 2022-07-19 DIAGNOSIS — Z1212 Encounter for screening for malignant neoplasm of rectum: Secondary | ICD-10-CM | POA: Diagnosis not present

## 2022-07-19 DIAGNOSIS — M25572 Pain in left ankle and joints of left foot: Secondary | ICD-10-CM | POA: Diagnosis not present

## 2022-07-19 DIAGNOSIS — E785 Hyperlipidemia, unspecified: Secondary | ICD-10-CM | POA: Diagnosis not present

## 2022-07-19 DIAGNOSIS — E039 Hypothyroidism, unspecified: Secondary | ICD-10-CM | POA: Diagnosis not present

## 2022-07-19 DIAGNOSIS — R7989 Other specified abnormal findings of blood chemistry: Secondary | ICD-10-CM | POA: Diagnosis not present
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
## 2022-07-22 ENCOUNTER — Encounter (INDEPENDENT_AMBULATORY_CARE_PROVIDER_SITE_OTHER): Payer: Self-pay | Admitting: Ophthalmology

## 2022-07-22 ENCOUNTER — Ambulatory Visit (INDEPENDENT_AMBULATORY_CARE_PROVIDER_SITE_OTHER): Payer: Medicare HMO | Admitting: Ophthalmology

## 2022-07-22 DIAGNOSIS — H34831 Tributary (branch) retinal vein occlusion, right eye, with macular edema: Secondary | ICD-10-CM | POA: Diagnosis not present

## 2022-07-22 DIAGNOSIS — L511 Stevens-Johnson syndrome: Secondary | ICD-10-CM

## 2022-07-22 DIAGNOSIS — M25572 Pain in left ankle and joints of left foot: Secondary | ICD-10-CM | POA: Diagnosis not present

## 2022-07-22 DIAGNOSIS — H35033 Hypertensive retinopathy, bilateral: Secondary | ICD-10-CM | POA: Diagnosis not present

## 2022-07-22 DIAGNOSIS — M25571 Pain in right ankle and joints of right foot: Secondary | ICD-10-CM | POA: Diagnosis not present

## 2022-07-22 DIAGNOSIS — Z961 Presence of intraocular lens: Secondary | ICD-10-CM

## 2022-07-22 DIAGNOSIS — I10 Essential (primary) hypertension: Secondary | ICD-10-CM

## 2022-07-22 MED ORDER — AFLIBERCEPT 2MG/0.05ML IZ SOLN FOR KALEIDOSCOPE
2.0000 mg | INTRAVITREAL | Status: AC | PRN
  Administered 2022-07-22: 2 mg via INTRAVITREAL
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
## 2022-08-25 ENCOUNTER — Ambulatory Visit (INDEPENDENT_AMBULATORY_CARE_PROVIDER_SITE_OTHER): Payer: Medicare HMO | Admitting: Ophthalmology

## 2022-08-25 ENCOUNTER — Encounter (INDEPENDENT_AMBULATORY_CARE_PROVIDER_SITE_OTHER): Payer: Self-pay | Admitting: Ophthalmology

## 2022-08-25 DIAGNOSIS — Z961 Presence of intraocular lens: Secondary | ICD-10-CM | POA: Diagnosis not present

## 2022-08-25 DIAGNOSIS — H35033 Hypertensive retinopathy, bilateral: Secondary | ICD-10-CM | POA: Diagnosis not present

## 2022-08-25 DIAGNOSIS — H34831 Tributary (branch) retinal vein occlusion, right eye, with macular edema: Secondary | ICD-10-CM

## 2022-08-25 DIAGNOSIS — L511 Stevens-Johnson syndrome: Secondary | ICD-10-CM

## 2022-08-25 DIAGNOSIS — I10 Essential (primary) hypertension: Secondary | ICD-10-CM

## 2022-08-25 MED ORDER — AFLIBERCEPT 2MG/0.05ML IZ SOLN FOR KALEIDOSCOPE
2.0000 mg | INTRAVITREAL | Status: AC | PRN
  Administered 2022-08-25: 2 mg via INTRAVITREAL

## 2022-08-26 ENCOUNTER — Encounter (INDEPENDENT_AMBULATORY_CARE_PROVIDER_SITE_OTHER): Payer: Medicare HMO | Admitting: Ophthalmology
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
## 2022-10-07 ENCOUNTER — Encounter (INDEPENDENT_AMBULATORY_CARE_PROVIDER_SITE_OTHER): Payer: Self-pay | Admitting: Ophthalmology

## 2022-10-07 ENCOUNTER — Ambulatory Visit (INDEPENDENT_AMBULATORY_CARE_PROVIDER_SITE_OTHER): Payer: Medicare HMO | Admitting: Ophthalmology

## 2022-10-07 DIAGNOSIS — H34831 Tributary (branch) retinal vein occlusion, right eye, with macular edema: Secondary | ICD-10-CM

## 2022-10-07 DIAGNOSIS — L511 Stevens-Johnson syndrome: Secondary | ICD-10-CM

## 2022-10-07 DIAGNOSIS — Z961 Presence of intraocular lens: Secondary | ICD-10-CM | POA: Diagnosis not present

## 2022-10-07 DIAGNOSIS — I10 Essential (primary) hypertension: Secondary | ICD-10-CM

## 2022-10-07 MED ORDER — AFLIBERCEPT 2MG/0.05ML IZ SOLN FOR KALEIDOSCOPE
2.0000 mg | INTRAVITREAL | Status: AC | PRN
  Administered 2022-10-07: 2 mg via INTRAVITREAL

## 2022-11-18 ENCOUNTER — Encounter (INDEPENDENT_AMBULATORY_CARE_PROVIDER_SITE_OTHER): Payer: Medicare HMO | Admitting: Ophthalmology

## 2022-11-18 DIAGNOSIS — H34831 Tributary (branch) retinal vein occlusion, right eye, with macular edema: Secondary | ICD-10-CM

## 2022-11-18 DIAGNOSIS — Z961 Presence of intraocular lens: Secondary | ICD-10-CM

## 2022-11-18 DIAGNOSIS — L511 Stevens-Johnson syndrome: Secondary | ICD-10-CM

## 2022-11-18 DIAGNOSIS — I10 Essential (primary) hypertension: Secondary | ICD-10-CM

## 2022-11-18 DIAGNOSIS — H35033 Hypertensive retinopathy, bilateral: Secondary | ICD-10-CM

## 2022-11-22 ENCOUNTER — Encounter (INDEPENDENT_AMBULATORY_CARE_PROVIDER_SITE_OTHER): Payer: Self-pay | Admitting: Ophthalmology

## 2022-11-22 ENCOUNTER — Ambulatory Visit (INDEPENDENT_AMBULATORY_CARE_PROVIDER_SITE_OTHER): Payer: Medicare HMO | Admitting: Ophthalmology

## 2022-11-22 DIAGNOSIS — Z961 Presence of intraocular lens: Secondary | ICD-10-CM

## 2022-11-22 DIAGNOSIS — H35033 Hypertensive retinopathy, bilateral: Secondary | ICD-10-CM

## 2022-11-22 DIAGNOSIS — H43822 Vitreomacular adhesion, left eye: Secondary | ICD-10-CM

## 2022-11-22 DIAGNOSIS — H26493 Other secondary cataract, bilateral: Secondary | ICD-10-CM

## 2022-11-22 DIAGNOSIS — I10 Essential (primary) hypertension: Secondary | ICD-10-CM | POA: Diagnosis not present

## 2022-11-22 DIAGNOSIS — L511 Stevens-Johnson syndrome: Secondary | ICD-10-CM

## 2022-11-22 DIAGNOSIS — H34831 Tributary (branch) retinal vein occlusion, right eye, with macular edema: Secondary | ICD-10-CM

## 2022-11-22 MED ORDER — AFLIBERCEPT 2MG/0.05ML IZ SOLN FOR KALEIDOSCOPE
2.0000 mg | INTRAVITREAL | Status: AC | PRN
  Administered 2022-11-22: 2 mg via INTRAVITREAL
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
## 2023-01-03 ENCOUNTER — Encounter (INDEPENDENT_AMBULATORY_CARE_PROVIDER_SITE_OTHER): Payer: Medicare HMO | Admitting: Ophthalmology

## 2023-01-03 DIAGNOSIS — I10 Essential (primary) hypertension: Secondary | ICD-10-CM

## 2023-01-03 DIAGNOSIS — Z961 Presence of intraocular lens: Secondary | ICD-10-CM

## 2023-01-03 DIAGNOSIS — L511 Stevens-Johnson syndrome: Secondary | ICD-10-CM

## 2023-01-03 DIAGNOSIS — H34831 Tributary (branch) retinal vein occlusion, right eye, with macular edema: Secondary | ICD-10-CM
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
## 2023-01-09 ENCOUNTER — Ambulatory Visit (INDEPENDENT_AMBULATORY_CARE_PROVIDER_SITE_OTHER): Payer: Medicare HMO | Admitting: Ophthalmology

## 2023-01-09 ENCOUNTER — Encounter (INDEPENDENT_AMBULATORY_CARE_PROVIDER_SITE_OTHER): Payer: Self-pay | Admitting: Ophthalmology

## 2023-01-09 DIAGNOSIS — Z961 Presence of intraocular lens: Secondary | ICD-10-CM | POA: Diagnosis not present

## 2023-01-09 DIAGNOSIS — H43812 Vitreous degeneration, left eye: Secondary | ICD-10-CM | POA: Diagnosis not present

## 2023-01-09 DIAGNOSIS — H35033 Hypertensive retinopathy, bilateral: Secondary | ICD-10-CM | POA: Diagnosis not present

## 2023-01-09 DIAGNOSIS — H34831 Tributary (branch) retinal vein occlusion, right eye, with macular edema: Secondary | ICD-10-CM

## 2023-01-09 DIAGNOSIS — I10 Essential (primary) hypertension: Secondary | ICD-10-CM | POA: Diagnosis not present

## 2023-01-09 DIAGNOSIS — L511 Stevens-Johnson syndrome: Secondary | ICD-10-CM

## 2023-01-09 MED ORDER — AFLIBERCEPT 2MG/0.05ML IZ SOLN FOR KALEIDOSCOPE
2.0000 mg | INTRAVITREAL | Status: AC | PRN
Start: 2023-01-09 — End: 2023-01-09
  Administered 2023-01-09: 2 mg via INTRAVITREAL
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
## 2023-03-06 ENCOUNTER — Encounter (INDEPENDENT_AMBULATORY_CARE_PROVIDER_SITE_OTHER): Payer: Self-pay | Admitting: Ophthalmology

## 2023-03-06 ENCOUNTER — Ambulatory Visit (INDEPENDENT_AMBULATORY_CARE_PROVIDER_SITE_OTHER): Payer: Medicare HMO | Admitting: Ophthalmology

## 2023-03-06 DIAGNOSIS — H43812 Vitreous degeneration, left eye: Secondary | ICD-10-CM

## 2023-03-06 DIAGNOSIS — I10 Essential (primary) hypertension: Secondary | ICD-10-CM

## 2023-03-06 DIAGNOSIS — H34831 Tributary (branch) retinal vein occlusion, right eye, with macular edema: Secondary | ICD-10-CM | POA: Diagnosis not present

## 2023-03-06 DIAGNOSIS — Z961 Presence of intraocular lens: Secondary | ICD-10-CM | POA: Diagnosis not present

## 2023-03-06 DIAGNOSIS — H35033 Hypertensive retinopathy, bilateral: Secondary | ICD-10-CM

## 2023-03-06 DIAGNOSIS — L511 Stevens-Johnson syndrome: Secondary | ICD-10-CM

## 2023-03-06 MED ORDER — AFLIBERCEPT 2MG/0.05ML IZ SOLN FOR KALEIDOSCOPE
2.0000 mg | INTRAVITREAL | Status: AC | PRN
Start: 2023-03-06 — End: 2023-03-06
  Administered 2023-03-06: 2 mg via INTRAVITREAL

## 2023-03-07 DIAGNOSIS — S93491A Sprain of other ligament of right ankle, initial encounter: Secondary | ICD-10-CM | POA: Diagnosis not present

## 2023-05-08 ENCOUNTER — Encounter (INDEPENDENT_AMBULATORY_CARE_PROVIDER_SITE_OTHER): Payer: Medicare HMO | Admitting: Ophthalmology

## 2023-05-08 DIAGNOSIS — H34831 Tributary (branch) retinal vein occlusion, right eye, with macular edema: Secondary | ICD-10-CM

## 2023-05-08 DIAGNOSIS — Z961 Presence of intraocular lens: Secondary | ICD-10-CM

## 2023-05-08 DIAGNOSIS — H43812 Vitreous degeneration, left eye: Secondary | ICD-10-CM

## 2023-05-08 DIAGNOSIS — L511 Stevens-Johnson syndrome: Secondary | ICD-10-CM

## 2023-05-08 DIAGNOSIS — I10 Essential (primary) hypertension: Secondary | ICD-10-CM

## 2023-05-08 DIAGNOSIS — H35033 Hypertensive retinopathy, bilateral: Secondary | ICD-10-CM
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
## 2023-05-15 ENCOUNTER — Encounter (INDEPENDENT_AMBULATORY_CARE_PROVIDER_SITE_OTHER): Payer: Medicare HMO | Admitting: Ophthalmology

## 2023-05-15 DIAGNOSIS — H43812 Vitreous degeneration, left eye: Secondary | ICD-10-CM

## 2023-05-15 DIAGNOSIS — Z961 Presence of intraocular lens: Secondary | ICD-10-CM

## 2023-05-15 DIAGNOSIS — H35033 Hypertensive retinopathy, bilateral: Secondary | ICD-10-CM

## 2023-05-15 DIAGNOSIS — L511 Stevens-Johnson syndrome: Secondary | ICD-10-CM

## 2023-05-15 DIAGNOSIS — H34831 Tributary (branch) retinal vein occlusion, right eye, with macular edema: Secondary | ICD-10-CM

## 2023-05-15 DIAGNOSIS — H26493 Other secondary cataract, bilateral: Secondary | ICD-10-CM

## 2023-05-15 DIAGNOSIS — I10 Essential (primary) hypertension: Secondary | ICD-10-CM

## 2023-05-15 DIAGNOSIS — H43822 Vitreomacular adhesion, left eye: Secondary | ICD-10-CM

## 2023-05-15 DIAGNOSIS — H3582 Retinal ischemia: Secondary | ICD-10-CM

## 2023-05-16 ENCOUNTER — Ambulatory Visit (INDEPENDENT_AMBULATORY_CARE_PROVIDER_SITE_OTHER): Payer: Medicare HMO | Admitting: Ophthalmology

## 2023-05-16 ENCOUNTER — Encounter (INDEPENDENT_AMBULATORY_CARE_PROVIDER_SITE_OTHER): Payer: Self-pay | Admitting: Ophthalmology

## 2023-05-16 DIAGNOSIS — H43812 Vitreous degeneration, left eye: Secondary | ICD-10-CM | POA: Diagnosis not present

## 2023-05-16 DIAGNOSIS — I10 Essential (primary) hypertension: Secondary | ICD-10-CM

## 2023-05-16 DIAGNOSIS — Z961 Presence of intraocular lens: Secondary | ICD-10-CM

## 2023-05-16 DIAGNOSIS — H35033 Hypertensive retinopathy, bilateral: Secondary | ICD-10-CM | POA: Diagnosis not present

## 2023-05-16 DIAGNOSIS — L511 Stevens-Johnson syndrome: Secondary | ICD-10-CM

## 2023-05-16 DIAGNOSIS — H34831 Tributary (branch) retinal vein occlusion, right eye, with macular edema: Secondary | ICD-10-CM

## 2023-05-16 MED ORDER — BEVACIZUMAB CHEMO INJECTION 1.25MG/0.05ML SYRINGE FOR KALEIDOSCOPE
1.2500 mg | INTRAVITREAL | Status: AC | PRN
  Administered 2023-05-16: 1.25 mg via INTRAVITREAL
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
## 2023-06-27 ENCOUNTER — Encounter (INDEPENDENT_AMBULATORY_CARE_PROVIDER_SITE_OTHER): Payer: Medicare HMO | Admitting: Ophthalmology

## 2023-06-27 DIAGNOSIS — Z961 Presence of intraocular lens: Secondary | ICD-10-CM

## 2023-06-27 DIAGNOSIS — H43812 Vitreous degeneration, left eye: Secondary | ICD-10-CM

## 2023-06-27 DIAGNOSIS — H34831 Tributary (branch) retinal vein occlusion, right eye, with macular edema: Secondary | ICD-10-CM

## 2023-06-27 DIAGNOSIS — H35033 Hypertensive retinopathy, bilateral: Secondary | ICD-10-CM

## 2023-06-27 DIAGNOSIS — L511 Stevens-Johnson syndrome: Secondary | ICD-10-CM

## 2023-06-27 DIAGNOSIS — I10 Essential (primary) hypertension: Secondary | ICD-10-CM
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
## 2023-07-03 ENCOUNTER — Ambulatory Visit (INDEPENDENT_AMBULATORY_CARE_PROVIDER_SITE_OTHER): Admitting: Ophthalmology

## 2023-07-03 ENCOUNTER — Encounter (INDEPENDENT_AMBULATORY_CARE_PROVIDER_SITE_OTHER): Payer: Self-pay | Admitting: Ophthalmology

## 2023-07-03 DIAGNOSIS — H35033 Hypertensive retinopathy, bilateral: Secondary | ICD-10-CM

## 2023-07-03 DIAGNOSIS — I10 Essential (primary) hypertension: Secondary | ICD-10-CM

## 2023-07-03 DIAGNOSIS — H43812 Vitreous degeneration, left eye: Secondary | ICD-10-CM

## 2023-07-03 DIAGNOSIS — L511 Stevens-Johnson syndrome: Secondary | ICD-10-CM

## 2023-07-03 DIAGNOSIS — H26493 Other secondary cataract, bilateral: Secondary | ICD-10-CM

## 2023-07-03 DIAGNOSIS — H34831 Tributary (branch) retinal vein occlusion, right eye, with macular edema: Secondary | ICD-10-CM | POA: Diagnosis not present

## 2023-07-03 DIAGNOSIS — H3582 Retinal ischemia: Secondary | ICD-10-CM

## 2023-07-03 DIAGNOSIS — Z961 Presence of intraocular lens: Secondary | ICD-10-CM | POA: Diagnosis not present

## 2023-07-03 DIAGNOSIS — H43822 Vitreomacular adhesion, left eye: Secondary | ICD-10-CM

## 2023-07-03 MED ORDER — BEVACIZUMAB CHEMO INJECTION 1.25MG/0.05ML SYRINGE FOR KALEIDOSCOPE
1.2500 mg | INTRAVITREAL | Status: AC | PRN
  Administered 2023-07-03: 1.25 mg via INTRAVITREAL

## 2023-08-01 DIAGNOSIS — E039 Hypothyroidism, unspecified: Secondary | ICD-10-CM | POA: Diagnosis not present

## 2023-08-01 DIAGNOSIS — E785 Hyperlipidemia, unspecified: Secondary | ICD-10-CM | POA: Diagnosis not present

## 2023-08-01 DIAGNOSIS — E538 Deficiency of other specified B group vitamins: Secondary | ICD-10-CM | POA: Diagnosis not present

## 2023-08-01 DIAGNOSIS — Z1212 Encounter for screening for malignant neoplasm of rectum: Secondary | ICD-10-CM | POA: Diagnosis not present
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
## 2023-08-07 ENCOUNTER — Encounter (INDEPENDENT_AMBULATORY_CARE_PROVIDER_SITE_OTHER): Payer: Self-pay | Admitting: Ophthalmology

## 2023-08-07 ENCOUNTER — Ambulatory Visit (INDEPENDENT_AMBULATORY_CARE_PROVIDER_SITE_OTHER): Admitting: Ophthalmology

## 2023-08-07 DIAGNOSIS — H35033 Hypertensive retinopathy, bilateral: Secondary | ICD-10-CM

## 2023-08-07 DIAGNOSIS — L511 Stevens-Johnson syndrome: Secondary | ICD-10-CM | POA: Diagnosis not present

## 2023-08-07 DIAGNOSIS — H34831 Tributary (branch) retinal vein occlusion, right eye, with macular edema: Secondary | ICD-10-CM | POA: Diagnosis not present

## 2023-08-07 DIAGNOSIS — I10 Essential (primary) hypertension: Secondary | ICD-10-CM

## 2023-08-07 DIAGNOSIS — Z961 Presence of intraocular lens: Secondary | ICD-10-CM | POA: Diagnosis not present

## 2023-08-07 DIAGNOSIS — H43812 Vitreous degeneration, left eye: Secondary | ICD-10-CM

## 2023-08-07 MED ORDER — BEVACIZUMAB CHEMO INJECTION 1.25MG/0.05ML SYRINGE FOR KALEIDOSCOPE
1.2500 mg | INTRAVITREAL | Status: AC | PRN
  Administered 2023-08-07: 1.25 mg via INTRAVITREAL

## 2023-08-08 DIAGNOSIS — I951 Orthostatic hypotension: Secondary | ICD-10-CM | POA: Diagnosis not present

## 2023-08-08 DIAGNOSIS — G909 Disorder of the autonomic nervous system, unspecified: Secondary | ICD-10-CM | POA: Diagnosis not present

## 2023-08-08 DIAGNOSIS — E785 Hyperlipidemia, unspecified: Secondary | ICD-10-CM | POA: Diagnosis not present

## 2023-08-08 DIAGNOSIS — R82998 Other abnormal findings in urine: Secondary | ICD-10-CM | POA: Diagnosis not present

## 2023-08-08 DIAGNOSIS — L309 Dermatitis, unspecified: Secondary | ICD-10-CM | POA: Diagnosis not present

## 2023-08-08 DIAGNOSIS — E039 Hypothyroidism, unspecified: Secondary | ICD-10-CM | POA: Diagnosis not present

## 2023-08-08 DIAGNOSIS — Z1331 Encounter for screening for depression: Secondary | ICD-10-CM | POA: Diagnosis not present

## 2023-08-08 DIAGNOSIS — Z1339 Encounter for screening examination for other mental health and behavioral disorders: Secondary | ICD-10-CM | POA: Diagnosis not present

## 2023-08-08 DIAGNOSIS — Z0000 Encounter for general adult medical examination without abnormal findings: Secondary | ICD-10-CM | POA: Diagnosis not present

## 2023-08-08 DIAGNOSIS — E538 Deficiency of other specified B group vitamins: Secondary | ICD-10-CM | POA: Diagnosis not present
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
## 2023-09-11 ENCOUNTER — Encounter (INDEPENDENT_AMBULATORY_CARE_PROVIDER_SITE_OTHER): Payer: Self-pay | Admitting: Ophthalmology

## 2023-09-11 ENCOUNTER — Ambulatory Visit (INDEPENDENT_AMBULATORY_CARE_PROVIDER_SITE_OTHER): Admitting: Ophthalmology

## 2023-09-11 DIAGNOSIS — I10 Essential (primary) hypertension: Secondary | ICD-10-CM

## 2023-09-11 DIAGNOSIS — L511 Stevens-Johnson syndrome: Secondary | ICD-10-CM

## 2023-09-11 DIAGNOSIS — H34831 Tributary (branch) retinal vein occlusion, right eye, with macular edema: Secondary | ICD-10-CM | POA: Diagnosis not present

## 2023-09-11 DIAGNOSIS — H35033 Hypertensive retinopathy, bilateral: Secondary | ICD-10-CM | POA: Diagnosis not present

## 2023-09-11 DIAGNOSIS — H43812 Vitreous degeneration, left eye: Secondary | ICD-10-CM | POA: Diagnosis not present

## 2023-09-11 DIAGNOSIS — Z961 Presence of intraocular lens: Secondary | ICD-10-CM | POA: Diagnosis not present

## 2023-09-11 MED ORDER — BEVACIZUMAB CHEMO INJECTION 1.25MG/0.05ML SYRINGE FOR KALEIDOSCOPE
1.2500 mg | INTRAVITREAL | Status: AC | PRN
  Administered 2023-09-11: 1.25 mg via INTRAVITREAL

## 2023-09-21 DIAGNOSIS — R2681 Unsteadiness on feet: Secondary | ICD-10-CM | POA: Diagnosis not present

## 2023-09-21 DIAGNOSIS — E538 Deficiency of other specified B group vitamins: Secondary | ICD-10-CM | POA: Diagnosis not present

## 2023-09-21 DIAGNOSIS — I951 Orthostatic hypotension: Secondary | ICD-10-CM | POA: Diagnosis not present

## 2023-09-21 DIAGNOSIS — E039 Hypothyroidism, unspecified: Secondary | ICD-10-CM | POA: Diagnosis not present

## 2023-09-21 DIAGNOSIS — N1832 Chronic kidney disease, stage 3b: Secondary | ICD-10-CM | POA: Diagnosis not present
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
## 2023-10-09 ENCOUNTER — Encounter (INDEPENDENT_AMBULATORY_CARE_PROVIDER_SITE_OTHER): Admitting: Ophthalmology

## 2023-10-09 DIAGNOSIS — H43812 Vitreous degeneration, left eye: Secondary | ICD-10-CM

## 2023-10-09 DIAGNOSIS — L511 Stevens-Johnson syndrome: Secondary | ICD-10-CM

## 2023-10-09 DIAGNOSIS — H35033 Hypertensive retinopathy, bilateral: Secondary | ICD-10-CM

## 2023-10-09 DIAGNOSIS — I10 Essential (primary) hypertension: Secondary | ICD-10-CM

## 2023-10-09 DIAGNOSIS — H34831 Tributary (branch) retinal vein occlusion, right eye, with macular edema: Secondary | ICD-10-CM

## 2023-10-09 DIAGNOSIS — Z961 Presence of intraocular lens: Secondary | ICD-10-CM
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
## 2023-10-16 ENCOUNTER — Encounter (INDEPENDENT_AMBULATORY_CARE_PROVIDER_SITE_OTHER): Payer: Self-pay | Admitting: Ophthalmology

## 2023-10-16 ENCOUNTER — Ambulatory Visit (INDEPENDENT_AMBULATORY_CARE_PROVIDER_SITE_OTHER): Admitting: Ophthalmology

## 2023-10-16 DIAGNOSIS — H35033 Hypertensive retinopathy, bilateral: Secondary | ICD-10-CM | POA: Diagnosis not present

## 2023-10-16 DIAGNOSIS — H43812 Vitreous degeneration, left eye: Secondary | ICD-10-CM | POA: Diagnosis not present

## 2023-10-16 DIAGNOSIS — L511 Stevens-Johnson syndrome: Secondary | ICD-10-CM

## 2023-10-16 DIAGNOSIS — H34831 Tributary (branch) retinal vein occlusion, right eye, with macular edema: Secondary | ICD-10-CM

## 2023-10-16 DIAGNOSIS — I10 Essential (primary) hypertension: Secondary | ICD-10-CM

## 2023-10-16 DIAGNOSIS — Z961 Presence of intraocular lens: Secondary | ICD-10-CM

## 2023-10-16 MED ORDER — BEVACIZUMAB CHEMO INJECTION 1.25MG/0.05ML SYRINGE FOR KALEIDOSCOPE
1.2500 mg | INTRAVITREAL | Status: AC | PRN
  Administered 2023-10-16: 1.25 mg via INTRAVITREAL
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
## 2023-11-13 ENCOUNTER — Encounter (INDEPENDENT_AMBULATORY_CARE_PROVIDER_SITE_OTHER): Admitting: Ophthalmology

## 2023-11-13 DIAGNOSIS — H34831 Tributary (branch) retinal vein occlusion, right eye, with macular edema: Secondary | ICD-10-CM

## 2023-11-13 DIAGNOSIS — L511 Stevens-Johnson syndrome: Secondary | ICD-10-CM

## 2023-11-13 DIAGNOSIS — H43812 Vitreous degeneration, left eye: Secondary | ICD-10-CM

## 2023-11-13 DIAGNOSIS — I10 Essential (primary) hypertension: Secondary | ICD-10-CM

## 2023-11-13 DIAGNOSIS — Z961 Presence of intraocular lens: Secondary | ICD-10-CM

## 2023-11-13 DIAGNOSIS — H35033 Hypertensive retinopathy, bilateral: Secondary | ICD-10-CM
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
## 2023-11-22 ENCOUNTER — Encounter (INDEPENDENT_AMBULATORY_CARE_PROVIDER_SITE_OTHER): Payer: Self-pay | Admitting: Ophthalmology

## 2023-11-22 ENCOUNTER — Ambulatory Visit (INDEPENDENT_AMBULATORY_CARE_PROVIDER_SITE_OTHER): Admitting: Ophthalmology

## 2023-11-22 DIAGNOSIS — H35033 Hypertensive retinopathy, bilateral: Secondary | ICD-10-CM

## 2023-11-22 DIAGNOSIS — Z961 Presence of intraocular lens: Secondary | ICD-10-CM

## 2023-11-22 DIAGNOSIS — I10 Essential (primary) hypertension: Secondary | ICD-10-CM

## 2023-11-22 DIAGNOSIS — H43812 Vitreous degeneration, left eye: Secondary | ICD-10-CM | POA: Diagnosis not present

## 2023-11-22 DIAGNOSIS — L511 Stevens-Johnson syndrome: Secondary | ICD-10-CM

## 2023-11-22 DIAGNOSIS — H43822 Vitreomacular adhesion, left eye: Secondary | ICD-10-CM | POA: Diagnosis not present

## 2023-11-22 DIAGNOSIS — H34831 Tributary (branch) retinal vein occlusion, right eye, with macular edema: Secondary | ICD-10-CM | POA: Diagnosis not present

## 2023-11-22 MED ORDER — BEVACIZUMAB CHEMO INJECTION 1.25MG/0.05ML SYRINGE FOR KALEIDOSCOPE
1.2500 mg | INTRAVITREAL | Status: AC | PRN
  Administered 2023-11-22: 1.25 mg via INTRAVITREAL
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
## 2023-12-20 ENCOUNTER — Encounter (INDEPENDENT_AMBULATORY_CARE_PROVIDER_SITE_OTHER): Payer: Self-pay | Admitting: Ophthalmology

## 2023-12-20 ENCOUNTER — Ambulatory Visit (INDEPENDENT_AMBULATORY_CARE_PROVIDER_SITE_OTHER): Admitting: Ophthalmology

## 2023-12-20 DIAGNOSIS — H34831 Tributary (branch) retinal vein occlusion, right eye, with macular edema: Secondary | ICD-10-CM | POA: Diagnosis not present

## 2023-12-20 DIAGNOSIS — H43812 Vitreous degeneration, left eye: Secondary | ICD-10-CM | POA: Diagnosis not present

## 2023-12-20 DIAGNOSIS — I10 Essential (primary) hypertension: Secondary | ICD-10-CM | POA: Diagnosis not present

## 2023-12-20 DIAGNOSIS — R2681 Unsteadiness on feet: Secondary | ICD-10-CM | POA: Diagnosis not present

## 2023-12-20 DIAGNOSIS — H35033 Hypertensive retinopathy, bilateral: Secondary | ICD-10-CM | POA: Diagnosis not present

## 2023-12-20 DIAGNOSIS — Z961 Presence of intraocular lens: Secondary | ICD-10-CM

## 2023-12-20 DIAGNOSIS — R296 Repeated falls: Secondary | ICD-10-CM | POA: Diagnosis not present

## 2023-12-20 DIAGNOSIS — L511 Stevens-Johnson syndrome: Secondary | ICD-10-CM

## 2023-12-20 DIAGNOSIS — G250 Essential tremor: Secondary | ICD-10-CM | POA: Diagnosis not present

## 2023-12-20 DIAGNOSIS — E538 Deficiency of other specified B group vitamins: Secondary | ICD-10-CM | POA: Diagnosis not present

## 2023-12-20 MED ORDER — BEVACIZUMAB CHEMO INJECTION 1.25MG/0.05ML SYRINGE FOR KALEIDOSCOPE
1.2500 mg | INTRAVITREAL | Status: AC | PRN
  Administered 2023-12-20: 1.25 mg via INTRAVITREAL
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
## 2024-01-17 ENCOUNTER — Encounter (INDEPENDENT_AMBULATORY_CARE_PROVIDER_SITE_OTHER): Admitting: Ophthalmology

## 2024-01-17 DIAGNOSIS — H34831 Tributary (branch) retinal vein occlusion, right eye, with macular edema: Secondary | ICD-10-CM

## 2024-01-17 DIAGNOSIS — L511 Stevens-Johnson syndrome: Secondary | ICD-10-CM

## 2024-01-17 DIAGNOSIS — H35033 Hypertensive retinopathy, bilateral: Secondary | ICD-10-CM

## 2024-01-17 DIAGNOSIS — Z961 Presence of intraocular lens: Secondary | ICD-10-CM

## 2024-01-17 DIAGNOSIS — I10 Essential (primary) hypertension: Secondary | ICD-10-CM

## 2024-01-17 DIAGNOSIS — H43812 Vitreous degeneration, left eye: Secondary | ICD-10-CM
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
## 2024-01-19 ENCOUNTER — Ambulatory Visit (INDEPENDENT_AMBULATORY_CARE_PROVIDER_SITE_OTHER): Admitting: Ophthalmology

## 2024-01-19 ENCOUNTER — Encounter (INDEPENDENT_AMBULATORY_CARE_PROVIDER_SITE_OTHER): Payer: Self-pay | Admitting: Ophthalmology

## 2024-01-19 DIAGNOSIS — H43812 Vitreous degeneration, left eye: Secondary | ICD-10-CM

## 2024-01-19 DIAGNOSIS — H34831 Tributary (branch) retinal vein occlusion, right eye, with macular edema: Secondary | ICD-10-CM | POA: Diagnosis not present

## 2024-01-19 DIAGNOSIS — H35033 Hypertensive retinopathy, bilateral: Secondary | ICD-10-CM

## 2024-01-19 DIAGNOSIS — I10 Essential (primary) hypertension: Secondary | ICD-10-CM | POA: Diagnosis not present

## 2024-01-19 DIAGNOSIS — Z961 Presence of intraocular lens: Secondary | ICD-10-CM | POA: Diagnosis not present

## 2024-01-19 DIAGNOSIS — E538 Deficiency of other specified B group vitamins: Secondary | ICD-10-CM | POA: Diagnosis not present

## 2024-01-19 DIAGNOSIS — L511 Stevens-Johnson syndrome: Secondary | ICD-10-CM

## 2024-01-21 ENCOUNTER — Encounter (INDEPENDENT_AMBULATORY_CARE_PROVIDER_SITE_OTHER): Payer: Self-pay | Admitting: Ophthalmology

## 2024-01-21 MED ORDER — BEVACIZUMAB CHEMO INJECTION 1.25MG/0.05ML SYRINGE FOR KALEIDOSCOPE
1.2500 mg | INTRAVITREAL | Status: AC | PRN
  Administered 2024-01-21: 1.25 mg via INTRAVITREAL
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
## 2024-02-23 ENCOUNTER — Ambulatory Visit (INDEPENDENT_AMBULATORY_CARE_PROVIDER_SITE_OTHER): Admitting: Ophthalmology

## 2024-02-23 ENCOUNTER — Encounter (INDEPENDENT_AMBULATORY_CARE_PROVIDER_SITE_OTHER): Payer: Self-pay | Admitting: Ophthalmology

## 2024-02-23 DIAGNOSIS — I10 Essential (primary) hypertension: Secondary | ICD-10-CM | POA: Diagnosis not present

## 2024-02-23 DIAGNOSIS — L511 Stevens-Johnson syndrome: Secondary | ICD-10-CM

## 2024-02-23 DIAGNOSIS — H43812 Vitreous degeneration, left eye: Secondary | ICD-10-CM | POA: Diagnosis not present

## 2024-02-23 DIAGNOSIS — Z961 Presence of intraocular lens: Secondary | ICD-10-CM

## 2024-02-23 DIAGNOSIS — H34831 Tributary (branch) retinal vein occlusion, right eye, with macular edema: Secondary | ICD-10-CM | POA: Diagnosis not present

## 2024-02-23 DIAGNOSIS — H35033 Hypertensive retinopathy, bilateral: Secondary | ICD-10-CM | POA: Diagnosis not present

## 2024-02-23 MED ORDER — BEVACIZUMAB CHEMO INJECTION 1.25MG/0.05ML SYRINGE FOR KALEIDOSCOPE
1.2500 mg | INTRAVITREAL | Status: AC | PRN
  Administered 2024-02-23: 1.25 mg via INTRAVITREAL
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
## 2024-03-29 ENCOUNTER — Ambulatory Visit (INDEPENDENT_AMBULATORY_CARE_PROVIDER_SITE_OTHER): Admitting: Ophthalmology

## 2024-03-29 ENCOUNTER — Encounter (INDEPENDENT_AMBULATORY_CARE_PROVIDER_SITE_OTHER): Payer: Self-pay | Admitting: Ophthalmology

## 2024-03-29 DIAGNOSIS — L511 Stevens-Johnson syndrome: Secondary | ICD-10-CM

## 2024-03-29 DIAGNOSIS — I10 Essential (primary) hypertension: Secondary | ICD-10-CM | POA: Diagnosis not present

## 2024-03-29 DIAGNOSIS — H34831 Tributary (branch) retinal vein occlusion, right eye, with macular edema: Secondary | ICD-10-CM

## 2024-03-29 DIAGNOSIS — Z961 Presence of intraocular lens: Secondary | ICD-10-CM

## 2024-03-29 DIAGNOSIS — H35033 Hypertensive retinopathy, bilateral: Secondary | ICD-10-CM | POA: Diagnosis not present

## 2024-03-29 DIAGNOSIS — H43812 Vitreous degeneration, left eye: Secondary | ICD-10-CM

## 2024-03-31 ENCOUNTER — Encounter (INDEPENDENT_AMBULATORY_CARE_PROVIDER_SITE_OTHER): Payer: Self-pay | Admitting: Ophthalmology

## 2024-03-31 MED ORDER — BEVACIZUMAB CHEMO INJECTION 1.25MG/0.05ML SYRINGE FOR KALEIDOSCOPE
1.2500 mg | INTRAVITREAL | Status: AC | PRN
  Administered 2024-03-31: 1.25 mg via INTRAVITREAL

## 2024-04-26 ENCOUNTER — Encounter (INDEPENDENT_AMBULATORY_CARE_PROVIDER_SITE_OTHER): Payer: Self-pay | Admitting: Ophthalmology

## 2024-04-26 ENCOUNTER — Ambulatory Visit (INDEPENDENT_AMBULATORY_CARE_PROVIDER_SITE_OTHER): Admitting: Ophthalmology

## 2024-04-26 DIAGNOSIS — H34831 Tributary (branch) retinal vein occlusion, right eye, with macular edema: Secondary | ICD-10-CM

## 2024-04-26 DIAGNOSIS — H43812 Vitreous degeneration, left eye: Secondary | ICD-10-CM

## 2024-04-26 DIAGNOSIS — H35033 Hypertensive retinopathy, bilateral: Secondary | ICD-10-CM

## 2024-04-26 DIAGNOSIS — L511 Stevens-Johnson syndrome: Secondary | ICD-10-CM

## 2024-04-26 DIAGNOSIS — Z961 Presence of intraocular lens: Secondary | ICD-10-CM

## 2024-04-26 DIAGNOSIS — I10 Essential (primary) hypertension: Secondary | ICD-10-CM

## 2024-04-26 MED ORDER — BEVACIZUMAB CHEMO INJECTION 1.25MG/0.05ML SYRINGE FOR KALEIDOSCOPE
1.2500 mg | INTRAVITREAL | Status: AC | PRN
  Administered 2024-04-26: 1.25 mg via INTRAVITREAL

## 2024-05-24 ENCOUNTER — Encounter (INDEPENDENT_AMBULATORY_CARE_PROVIDER_SITE_OTHER): Admitting: Ophthalmology
